# Patient Record
Sex: Female | Born: 1935
Health system: Southern US, Community
[De-identification: ages and names within clinical notes are randomized; demographics above are authoritative.]

## PROBLEM LIST (undated history)

## (undated) DIAGNOSIS — K219 Gastro-esophageal reflux disease without esophagitis: Secondary | ICD-10-CM

## (undated) DIAGNOSIS — E039 Hypothyroidism, unspecified: Secondary | ICD-10-CM

## (undated) DIAGNOSIS — M1990 Unspecified osteoarthritis, unspecified site: Secondary | ICD-10-CM

## (undated) DIAGNOSIS — D649 Anemia, unspecified: Secondary | ICD-10-CM

## (undated) DIAGNOSIS — R0602 Shortness of breath: Secondary | ICD-10-CM

## (undated) DIAGNOSIS — R739 Hyperglycemia, unspecified: Secondary | ICD-10-CM

## (undated) DIAGNOSIS — M109 Gout, unspecified: Secondary | ICD-10-CM

## (undated) DIAGNOSIS — H269 Unspecified cataract: Secondary | ICD-10-CM

## (undated) DIAGNOSIS — G459 Transient cerebral ischemic attack, unspecified: Secondary | ICD-10-CM

## (undated) DIAGNOSIS — R42 Dizziness and giddiness: Secondary | ICD-10-CM

## (undated) DIAGNOSIS — M1711 Unilateral primary osteoarthritis, right knee: Secondary | ICD-10-CM

## (undated) DIAGNOSIS — F419 Anxiety disorder, unspecified: Secondary | ICD-10-CM

## (undated) DIAGNOSIS — I471 Supraventricular tachycardia: Secondary | ICD-10-CM

## (undated) DIAGNOSIS — F329 Major depressive disorder, single episode, unspecified: Secondary | ICD-10-CM

## (undated) DIAGNOSIS — I2510 Atherosclerotic heart disease of native coronary artery without angina pectoris: Secondary | ICD-10-CM

## (undated) DIAGNOSIS — Z8719 Personal history of other diseases of the digestive system: Secondary | ICD-10-CM

## (undated) DIAGNOSIS — F32A Depression, unspecified: Secondary | ICD-10-CM

## (undated) DIAGNOSIS — K5732 Diverticulitis of large intestine without perforation or abscess without bleeding: Secondary | ICD-10-CM

## (undated) HISTORY — DX: Transient cerebral ischemic attack, unspecified: G45.9

## (undated) HISTORY — DX: Unspecified cataract: H26.9

## (undated) HISTORY — PX: HERNIA REPAIR: SHX51

## (undated) HISTORY — PX: PARTIAL COLECTOMY: SHX5273

## (undated) HISTORY — DX: Atherosclerotic heart disease of native coronary artery without angina pectoris: I25.10

## (undated) HISTORY — DX: Supraventricular tachycardia: I47.1

## (undated) HISTORY — DX: Hypothyroidism, unspecified: E03.9

## (undated) HISTORY — DX: Gout, unspecified: M10.9

## (undated) HISTORY — DX: Unilateral primary osteoarthritis, right knee: M17.11

## (undated) HISTORY — PX: COLON SURGERY: SHX602

## (undated) HISTORY — DX: Gastro-esophageal reflux disease without esophagitis: K21.9

## (undated) HISTORY — DX: Anemia, unspecified: D64.9

## (undated) HISTORY — DX: Unspecified osteoarthritis, unspecified site: M19.90

## (undated) HISTORY — PX: ABDOMINAL HYSTERECTOMY: SHX81

## (undated) HISTORY — DX: Depression, unspecified: F32.A

## (undated) HISTORY — DX: Diverticulitis of large intestine without perforation or abscess without bleeding: K57.32

## (undated) HISTORY — DX: Hyperglycemia, unspecified: R73.9

## (undated) HISTORY — DX: Major depressive disorder, single episode, unspecified: F32.9

---

## 1997-11-19 ENCOUNTER — Emergency Department (HOSPITAL_COMMUNITY): Admission: EM | Admit: 1997-11-19 | Discharge: 1997-11-19 | Payer: Self-pay | Admitting: Emergency Medicine

## 2001-02-16 ENCOUNTER — Encounter: Payer: Self-pay | Admitting: Emergency Medicine

## 2001-02-16 ENCOUNTER — Inpatient Hospital Stay (HOSPITAL_COMMUNITY): Admission: EM | Admit: 2001-02-16 | Discharge: 2001-02-19 | Payer: Self-pay | Admitting: Emergency Medicine

## 2001-02-19 ENCOUNTER — Encounter: Payer: Self-pay | Admitting: Cardiology

## 2001-03-09 ENCOUNTER — Encounter: Payer: Self-pay | Admitting: Cardiology

## 2001-03-09 ENCOUNTER — Encounter: Admission: RE | Admit: 2001-03-09 | Discharge: 2001-03-09 | Payer: Self-pay | Admitting: Cardiology

## 2001-04-09 ENCOUNTER — Encounter: Payer: Self-pay | Admitting: Neurological Surgery

## 2001-04-09 ENCOUNTER — Encounter: Admission: RE | Admit: 2001-04-09 | Discharge: 2001-04-09 | Payer: Self-pay | Admitting: Neurological Surgery

## 2001-06-10 ENCOUNTER — Ambulatory Visit (HOSPITAL_COMMUNITY): Admission: RE | Admit: 2001-06-10 | Discharge: 2001-06-10 | Payer: Self-pay | Admitting: Neurological Surgery

## 2001-06-10 ENCOUNTER — Encounter: Payer: Self-pay | Admitting: Neurological Surgery

## 2002-09-23 ENCOUNTER — Ambulatory Visit (HOSPITAL_COMMUNITY): Admission: RE | Admit: 2002-09-23 | Discharge: 2002-09-23 | Payer: Self-pay | Admitting: Family Medicine

## 2002-09-23 ENCOUNTER — Encounter: Payer: Self-pay | Admitting: Family Medicine

## 2003-04-01 ENCOUNTER — Emergency Department (HOSPITAL_COMMUNITY): Admission: EM | Admit: 2003-04-01 | Discharge: 2003-04-01 | Payer: Self-pay | Admitting: Emergency Medicine

## 2003-11-02 ENCOUNTER — Encounter: Admission: RE | Admit: 2003-11-02 | Discharge: 2003-11-02 | Payer: Self-pay | Admitting: Orthopedic Surgery

## 2003-12-08 ENCOUNTER — Emergency Department (HOSPITAL_COMMUNITY): Admission: EM | Admit: 2003-12-08 | Discharge: 2003-12-08 | Payer: Self-pay | Admitting: Emergency Medicine

## 2005-04-28 ENCOUNTER — Ambulatory Visit: Payer: Self-pay | Admitting: Family Medicine

## 2005-06-25 ENCOUNTER — Ambulatory Visit: Payer: Self-pay | Admitting: Family Medicine

## 2005-07-18 ENCOUNTER — Encounter: Admission: RE | Admit: 2005-07-18 | Discharge: 2005-07-18 | Payer: Self-pay | Admitting: Orthopedic Surgery

## 2005-08-01 ENCOUNTER — Ambulatory Visit: Payer: Self-pay | Admitting: Family Medicine

## 2005-08-08 ENCOUNTER — Ambulatory Visit (HOSPITAL_COMMUNITY): Admission: RE | Admit: 2005-08-08 | Discharge: 2005-08-08 | Payer: Self-pay | Admitting: Family Medicine

## 2005-09-22 ENCOUNTER — Ambulatory Visit: Payer: Self-pay | Admitting: Internal Medicine

## 2005-09-29 ENCOUNTER — Ambulatory Visit: Payer: Self-pay | Admitting: Cardiology

## 2005-10-06 ENCOUNTER — Ambulatory Visit: Payer: Self-pay | Admitting: Cardiology

## 2005-10-06 ENCOUNTER — Ambulatory Visit: Payer: Self-pay

## 2005-10-10 ENCOUNTER — Ambulatory Visit: Payer: Self-pay | Admitting: Internal Medicine

## 2005-10-10 HISTORY — PX: COLONOSCOPY: SHX174

## 2005-10-24 ENCOUNTER — Ambulatory Visit: Payer: Self-pay | Admitting: Family Medicine

## 2005-10-24 ENCOUNTER — Encounter: Admission: RE | Admit: 2005-10-24 | Discharge: 2005-10-24 | Payer: Self-pay | Admitting: Orthopedic Surgery

## 2005-10-27 ENCOUNTER — Ambulatory Visit: Payer: Self-pay | Admitting: Cardiology

## 2005-11-12 ENCOUNTER — Encounter: Admission: RE | Admit: 2005-11-12 | Discharge: 2005-11-12 | Payer: Self-pay | Admitting: Orthopedic Surgery

## 2005-11-14 ENCOUNTER — Ambulatory Visit: Payer: Self-pay | Admitting: Family Medicine

## 2005-12-08 ENCOUNTER — Encounter: Admission: RE | Admit: 2005-12-08 | Discharge: 2006-03-08 | Payer: Self-pay | Admitting: Family Medicine

## 2005-12-08 ENCOUNTER — Ambulatory Visit: Payer: Self-pay | Admitting: Family Medicine

## 2006-05-22 ENCOUNTER — Ambulatory Visit: Payer: Self-pay | Admitting: Family Medicine

## 2006-07-01 ENCOUNTER — Ambulatory Visit: Payer: Self-pay | Admitting: Family Medicine

## 2006-07-13 ENCOUNTER — Ambulatory Visit: Payer: Self-pay | Admitting: Family Medicine

## 2006-08-06 ENCOUNTER — Ambulatory Visit: Payer: Self-pay | Admitting: Family Medicine

## 2006-08-06 ENCOUNTER — Encounter: Admission: RE | Admit: 2006-08-06 | Discharge: 2006-08-06 | Payer: Self-pay | Admitting: Family Medicine

## 2006-08-06 LAB — CONVERTED CEMR LAB
ALT: 16 units/L (ref 0–40)
AST: 18 units/L (ref 0–37)
Alkaline Phosphatase: 84 units/L (ref 39–117)
Basophils Absolute: 0 10*3/uL (ref 0.0–0.1)
Eosinophils Absolute: 0.1 10*3/uL (ref 0.0–0.6)
Glucose, Bld: 79 mg/dL (ref 70–99)
HCT: 42.8 % (ref 36.0–46.0)
Lymphocytes Relative: 30 % (ref 12.0–46.0)
MCHC: 33.4 g/dL (ref 30.0–36.0)
MCV: 86.7 fL (ref 78.0–100.0)
Monocytes Absolute: 0.8 10*3/uL — ABNORMAL HIGH (ref 0.2–0.7)
Neutro Abs: 5.6 10*3/uL (ref 1.4–7.7)
Neutrophils Relative %: 59.7 % (ref 43.0–77.0)
RBC: 4.93 M/uL (ref 3.87–5.11)
Sodium: 143 meq/L (ref 135–145)
Total Bilirubin: 0.8 mg/dL (ref 0.3–1.2)
Total Protein: 6.9 g/dL (ref 6.0–8.3)

## 2006-12-18 ENCOUNTER — Telehealth: Payer: Self-pay | Admitting: *Deleted

## 2007-04-27 ENCOUNTER — Ambulatory Visit: Payer: Self-pay | Admitting: Family Medicine

## 2007-04-27 DIAGNOSIS — R5383 Other fatigue: Secondary | ICD-10-CM

## 2007-04-27 DIAGNOSIS — R5381 Other malaise: Secondary | ICD-10-CM

## 2007-04-27 DIAGNOSIS — K219 Gastro-esophageal reflux disease without esophagitis: Secondary | ICD-10-CM | POA: Insufficient documentation

## 2007-04-27 DIAGNOSIS — I2510 Atherosclerotic heart disease of native coronary artery without angina pectoris: Secondary | ICD-10-CM

## 2007-04-27 DIAGNOSIS — D649 Anemia, unspecified: Secondary | ICD-10-CM

## 2007-04-27 DIAGNOSIS — M545 Low back pain: Secondary | ICD-10-CM

## 2007-04-27 DIAGNOSIS — E785 Hyperlipidemia, unspecified: Secondary | ICD-10-CM

## 2007-04-27 DIAGNOSIS — E039 Hypothyroidism, unspecified: Secondary | ICD-10-CM | POA: Insufficient documentation

## 2007-04-27 DIAGNOSIS — F329 Major depressive disorder, single episode, unspecified: Secondary | ICD-10-CM

## 2007-04-29 LAB — CONVERTED CEMR LAB
AST: 23 units/L (ref 0–37)
Albumin: 4 g/dL (ref 3.5–5.2)
Basophils Absolute: 0.1 10*3/uL (ref 0.0–0.1)
Chloride: 104 meq/L (ref 96–112)
Eosinophils Absolute: 0.3 10*3/uL (ref 0.0–0.6)
GFR calc non Af Amer: 52 mL/min
HCT: 42.8 % (ref 36.0–46.0)
MCHC: 33.8 g/dL (ref 30.0–36.0)
MCV: 87.6 fL (ref 78.0–100.0)
Monocytes Relative: 6.9 % (ref 3.0–11.0)
Neutrophils Relative %: 49 % (ref 43.0–77.0)
Platelets: 337 10*3/uL (ref 150–400)
RBC: 4.89 M/uL (ref 3.87–5.11)
Sodium: 141 meq/L (ref 135–145)
TSH: 6.44 microintl units/mL — ABNORMAL HIGH (ref 0.35–5.50)
Vitamin B-12: 245 pg/mL (ref 211–911)

## 2007-05-12 ENCOUNTER — Telehealth: Payer: Self-pay | Admitting: Family Medicine

## 2007-07-15 ENCOUNTER — Telehealth: Payer: Self-pay | Admitting: Family Medicine

## 2007-07-20 ENCOUNTER — Telehealth: Payer: Self-pay | Admitting: Family Medicine

## 2007-08-23 ENCOUNTER — Ambulatory Visit: Payer: Self-pay | Admitting: Family Medicine

## 2007-08-24 ENCOUNTER — Ambulatory Visit: Payer: Self-pay | Admitting: Family Medicine

## 2007-08-25 ENCOUNTER — Telehealth: Payer: Self-pay | Admitting: Family Medicine

## 2007-08-30 ENCOUNTER — Ambulatory Visit: Payer: Self-pay | Admitting: Internal Medicine

## 2007-11-25 ENCOUNTER — Ambulatory Visit: Payer: Self-pay | Admitting: Family Medicine

## 2007-11-25 DIAGNOSIS — M79609 Pain in unspecified limb: Secondary | ICD-10-CM | POA: Insufficient documentation

## 2007-11-25 DIAGNOSIS — M1990 Unspecified osteoarthritis, unspecified site: Secondary | ICD-10-CM | POA: Insufficient documentation

## 2007-12-20 ENCOUNTER — Ambulatory Visit: Payer: Self-pay | Admitting: Vascular Surgery

## 2008-01-07 ENCOUNTER — Ambulatory Visit: Payer: Self-pay | Admitting: Family Medicine

## 2008-01-07 DIAGNOSIS — J209 Acute bronchitis, unspecified: Secondary | ICD-10-CM | POA: Insufficient documentation

## 2008-01-24 ENCOUNTER — Ambulatory Visit: Payer: Self-pay | Admitting: Cardiology

## 2008-02-07 ENCOUNTER — Ambulatory Visit: Payer: Self-pay

## 2008-02-07 ENCOUNTER — Encounter: Payer: Self-pay | Admitting: Cardiology

## 2008-02-07 ENCOUNTER — Encounter: Payer: Self-pay | Admitting: Family Medicine

## 2008-02-11 ENCOUNTER — Ambulatory Visit: Payer: Self-pay | Admitting: Cardiology

## 2008-02-11 LAB — CONVERTED CEMR LAB
ALT: 12 units/L (ref 0–35)
Albumin: 3.9 g/dL (ref 3.5–5.2)
Direct LDL: 144.1 mg/dL
HDL: 52.1 mg/dL (ref >39.0)
Total Bilirubin: 0.7 mg/dL (ref 0.3–1.2)
Total Protein: 7.1 g/dL (ref 6.0–8.3)
Triglycerides: 99 mg/dL (ref 0–149)

## 2008-04-05 ENCOUNTER — Ambulatory Visit: Payer: Self-pay | Admitting: Family Medicine

## 2008-04-05 LAB — CONVERTED CEMR LAB: Rapid Strep: NEGATIVE

## 2008-06-21 ENCOUNTER — Ambulatory Visit: Payer: Self-pay | Admitting: Family Medicine

## 2008-06-21 DIAGNOSIS — R7309 Other abnormal glucose: Secondary | ICD-10-CM | POA: Insufficient documentation

## 2008-06-22 ENCOUNTER — Encounter: Payer: Self-pay | Admitting: Family Medicine

## 2008-06-22 ENCOUNTER — Ambulatory Visit: Payer: Self-pay | Admitting: Internal Medicine

## 2008-06-23 LAB — CONVERTED CEMR LAB
ALT: 9 units/L (ref 0–35)
Albumin: 4 g/dL (ref 3.5–5.2)
BUN: 17 mg/dL (ref 6–23)
Basophils Absolute: 0 10*3/uL (ref 0.0–0.1)
Basophils Relative: 0.5 % (ref 0.0–3.0)
CO2: 29 meq/L (ref 19–32)
Calcium: 9.4 mg/dL (ref 8.4–10.5)
Creatinine, Ser: 0.9 mg/dL (ref 0.4–1.2)
Direct LDL: 144 mg/dL
Eosinophils Absolute: 0.2 10*3/uL (ref 0.0–0.7)
GFR calc non Af Amer: 65 mL/min
HCT: 46.1 % — ABNORMAL HIGH (ref 36.0–46.0)
Hemoglobin: 15.4 g/dL — ABNORMAL HIGH (ref 12.0–15.0)
Hgb A1c MFr Bld: 5.9 % (ref 4.6–6.0)
MCHC: 33.5 g/dL (ref 30.0–36.0)
MCV: 87.5 fL (ref 78.0–100.0)
Neutro Abs: 4.1 10*3/uL (ref 1.4–7.7)
RBC: 5.27 M/uL — ABNORMAL HIGH (ref 3.87–5.11)
Total Bilirubin: 0.5 mg/dL (ref 0.3–1.2)

## 2008-08-15 ENCOUNTER — Ambulatory Visit: Payer: Self-pay | Admitting: Family Medicine

## 2008-08-15 DIAGNOSIS — R071 Chest pain on breathing: Secondary | ICD-10-CM

## 2008-08-15 DIAGNOSIS — J309 Allergic rhinitis, unspecified: Secondary | ICD-10-CM | POA: Insufficient documentation

## 2008-12-05 ENCOUNTER — Ambulatory Visit: Payer: Self-pay | Admitting: Family Medicine

## 2008-12-05 DIAGNOSIS — N949 Unspecified condition associated with female genital organs and menstrual cycle: Secondary | ICD-10-CM | POA: Insufficient documentation

## 2009-01-03 ENCOUNTER — Encounter: Payer: Self-pay | Admitting: Family Medicine

## 2009-07-13 ENCOUNTER — Ambulatory Visit: Payer: Self-pay | Admitting: Family Medicine

## 2009-08-21 ENCOUNTER — Ambulatory Visit: Payer: Self-pay | Admitting: Family Medicine

## 2009-08-21 DIAGNOSIS — K5732 Diverticulitis of large intestine without perforation or abscess without bleeding: Secondary | ICD-10-CM

## 2009-08-23 ENCOUNTER — Other Ambulatory Visit: Admission: RE | Admit: 2009-08-23 | Discharge: 2009-08-23 | Payer: Self-pay | Admitting: Family Medicine

## 2009-08-23 ENCOUNTER — Ambulatory Visit: Payer: Self-pay | Admitting: Family Medicine

## 2009-08-23 DIAGNOSIS — M21619 Bunion of unspecified foot: Secondary | ICD-10-CM

## 2009-08-28 ENCOUNTER — Telehealth: Payer: Self-pay

## 2009-09-19 ENCOUNTER — Ambulatory Visit: Payer: Self-pay | Admitting: Family Medicine

## 2009-10-01 ENCOUNTER — Ambulatory Visit: Payer: Self-pay | Admitting: Family Medicine

## 2009-10-04 ENCOUNTER — Ambulatory Visit: Payer: Self-pay | Admitting: Internal Medicine

## 2009-11-08 ENCOUNTER — Telehealth: Payer: Self-pay | Admitting: Family Medicine

## 2010-01-07 ENCOUNTER — Ambulatory Visit: Payer: Self-pay | Admitting: Family Medicine

## 2010-01-09 ENCOUNTER — Telehealth: Payer: Self-pay | Admitting: Family Medicine

## 2010-02-08 ENCOUNTER — Ambulatory Visit: Payer: Self-pay | Admitting: Family Medicine

## 2010-02-08 DIAGNOSIS — H109 Unspecified conjunctivitis: Secondary | ICD-10-CM | POA: Insufficient documentation

## 2010-03-13 ENCOUNTER — Telehealth: Payer: Self-pay | Admitting: Family Medicine

## 2010-04-23 ENCOUNTER — Emergency Department (HOSPITAL_COMMUNITY)
Admission: EM | Admit: 2010-04-23 | Discharge: 2010-04-23 | Payer: Self-pay | Source: Home / Self Care | Admitting: Family Medicine

## 2010-04-23 ENCOUNTER — Telehealth: Payer: Self-pay | Admitting: Family Medicine

## 2010-05-01 ENCOUNTER — Ambulatory Visit: Payer: Self-pay

## 2010-05-01 DIAGNOSIS — M25579 Pain in unspecified ankle and joints of unspecified foot: Secondary | ICD-10-CM

## 2010-05-02 ENCOUNTER — Encounter: Payer: Self-pay | Admitting: Family Medicine

## 2010-05-09 ENCOUNTER — Telehealth: Payer: Self-pay | Admitting: Family Medicine

## 2010-06-06 ENCOUNTER — Telehealth: Payer: Self-pay | Admitting: Family Medicine

## 2010-06-15 ENCOUNTER — Emergency Department (HOSPITAL_COMMUNITY)
Admission: EM | Admit: 2010-06-15 | Discharge: 2010-06-15 | Payer: Self-pay | Source: Home / Self Care | Admitting: Family Medicine

## 2010-06-16 ENCOUNTER — Encounter: Payer: Self-pay | Admitting: Family Medicine

## 2010-06-17 ENCOUNTER — Ambulatory Visit
Admission: RE | Admit: 2010-06-17 | Discharge: 2010-06-17 | Payer: Self-pay | Source: Home / Self Care | Attending: Family Medicine | Admitting: Family Medicine

## 2010-06-17 ENCOUNTER — Other Ambulatory Visit: Payer: Self-pay | Admitting: Family Medicine

## 2010-06-17 DIAGNOSIS — K449 Diaphragmatic hernia without obstruction or gangrene: Secondary | ICD-10-CM | POA: Insufficient documentation

## 2010-06-17 LAB — HEPATIC FUNCTION PANEL
Bilirubin, Direct: 0.1 mg/dL (ref 0.0–0.3)
Total Bilirubin: 0.3 mg/dL (ref 0.3–1.2)

## 2010-06-17 LAB — CBC WITH DIFFERENTIAL/PLATELET
Eosinophils Absolute: 0.2 10*3/uL (ref 0.0–0.7)
MCHC: 33.4 g/dL (ref 30.0–36.0)
MCV: 88.6 fl (ref 78.0–100.0)
Monocytes Absolute: 0.7 10*3/uL (ref 0.1–1.0)
Neutrophils Relative %: 53 % (ref 43.0–77.0)
Platelets: 345 10*3/uL (ref 150.0–400.0)
RDW: 14.8 % — ABNORMAL HIGH (ref 11.5–14.6)

## 2010-06-17 LAB — BASIC METABOLIC PANEL
BUN: 20 mg/dL (ref 6–23)
CO2: 28 mEq/L (ref 19–32)
Chloride: 103 mEq/L (ref 96–112)
Creatinine, Ser: 1 mg/dL (ref 0.4–1.2)

## 2010-06-18 LAB — DIFFERENTIAL
Basophils Absolute: 0.1 10*3/uL (ref 0.0–0.1)
Lymphocytes Relative: 20 % (ref 12–46)
Monocytes Absolute: 0.9 10*3/uL (ref 0.1–1.0)
Monocytes Relative: 7 % (ref 3–12)
Neutro Abs: 8.9 10*3/uL — ABNORMAL HIGH (ref 1.7–7.7)
Neutrophils Relative %: 72 % (ref 43–77)

## 2010-06-18 LAB — POCT URINALYSIS DIPSTICK
Bilirubin Urine: NEGATIVE
Hgb urine dipstick: NEGATIVE
Nitrite: NEGATIVE
Specific Gravity, Urine: 1.015 (ref 1.005–1.030)
Urine Glucose, Fasting: NEGATIVE mg/dL

## 2010-06-18 LAB — CBC
HCT: 43.3 % (ref 36.0–46.0)
Hemoglobin: 13.7 g/dL (ref 12.0–15.0)
MCHC: 31.6 g/dL (ref 30.0–36.0)
RBC: 4.93 MIL/uL (ref 3.87–5.11)

## 2010-06-23 LAB — CONVERTED CEMR LAB
Albumin: 3.8 g/dL (ref 3.5–5.2)
Basophils Absolute: 0.1 10*3/uL (ref 0.0–0.1)
Basophils Relative: 0.7 % (ref 0.0–3.0)
Bilirubin Urine: NEGATIVE
CO2: 30 meq/L (ref 19–32)
Calcium: 9.6 mg/dL (ref 8.4–10.5)
Chloride: 104 meq/L (ref 96–112)
Direct LDL: 150.6 mg/dL
Eosinophils Absolute: 0.1 10*3/uL (ref 0.0–0.7)
Glucose, Bld: 103 mg/dL — ABNORMAL HIGH (ref 70–99)
HCT: 46.8 % — ABNORMAL HIGH (ref 36.0–46.0)
Hemoglobin: 15.8 g/dL — ABNORMAL HIGH (ref 12.0–15.0)
Lymphs Abs: 2.7 10*3/uL (ref 0.7–4.0)
MCHC: 33.7 g/dL (ref 30.0–36.0)
MCV: 87.3 fL (ref 78.0–100.0)
Monocytes Absolute: 0.6 10*3/uL (ref 0.1–1.0)
Neutro Abs: 5.3 10*3/uL (ref 1.4–7.7)
Pap Smear: NEGATIVE
Potassium: 4.2 meq/L (ref 3.5–5.1)
RBC: 5.37 M/uL — ABNORMAL HIGH (ref 3.87–5.11)
RDW: 15.2 % — ABNORMAL HIGH (ref 11.5–14.6)
Sodium: 143 meq/L (ref 135–145)
TSH: 0.6 microintl units/mL (ref 0.35–5.50)
Total CHOL/HDL Ratio: 4
Total Protein: 7.7 g/dL (ref 6.0–8.3)
Triglycerides: 113 mg/dL (ref 0.0–149.0)
Urobilinogen, UA: 0.2
WBC Urine, dipstick: NEGATIVE

## 2010-06-27 NOTE — Assessment & Plan Note (Signed)
Summary: chest congestion and pain when breathing?/dm   Vital Signs:  Patient profile:   77 year old female Weight:      155 pounds O2 Sat:      94 % on Room air Temp:     98.0 degrees F oral Pulse rate:   91 / minute BP sitting:   132 / 84  (left arm) Cuff size:   regular  Vitals Entered By: Alfred Levins, CMA (July 13, 2009 1:54 PM)  O2 Flow:  Room air CC: st, chest pain, SOB, not taking her meds, chest hurts when breathing in x3 wks   History of Present Illness: Here for 3 weeks of constant sharp anterior chest pains. No SOB or cough, although taking deep breaths is painful. No fever or nausea.   Current Medications (verified): 1)  Ibuprofen 800 Mg Tabs (Ibuprofen) .Marland Kitchen.. 1 By Mouth Four Times Daily As Needed For Pain 2)  Nitroglycerin 0.3 Mg Subl (Nitroglycerin) .... As Needed  Allergies (verified): 1)  ! Sulfa  Past History:  Past Medical History: Reviewed history from 04/05/2008 and no changes required. Coronary artery disease, sees Dr. Antoine Poche Anemia-NOS Depression GERD Hyperlipidemia Hypothyroidism cardiac stress test 02-07-08, showed slight ischemia  Osteoarthritis  Past Surgical History: Reviewed history from 11/25/2007 and no changes required. Cardiac Cath for angina TAH and BSO 1965 colonoscopy 10-10-05 per Dr. Juanda Chance, repeat in 10 yrs  Review of Systems  The patient denies anorexia, fever, weight loss, weight gain, vision loss, decreased hearing, hoarseness, syncope, dyspnea on exertion, peripheral edema, prolonged cough, headaches, hemoptysis, abdominal pain, melena, hematochezia, severe indigestion/heartburn, hematuria, incontinence, genital sores, muscle weakness, suspicious skin lesions, transient blindness, difficulty walking, depression, unusual weight change, abnormal bleeding, enlarged lymph nodes, angioedema, breast masses, and testicular masses.    Physical Exam  General:  Well-developed,well-nourished,in no acute distress;  alert,appropriate and cooperative throughout examination Head:  Normocephalic and atraumatic without obvious abnormalities. No apparent alopecia or balding. Eyes:  No corneal or conjunctival inflammation noted. EOMI. Perrla. Funduscopic exam benign, without hemorrhages, exudates or papilledema. Vision grossly normal. Ears:  External ear exam shows no significant lesions or deformities.  Otoscopic examination reveals clear canals, tympanic membranes are intact bilaterally without bulging, retraction, inflammation or discharge. Hearing is grossly normal bilaterally. Nose:  External nasal examination shows no deformity or inflammation. Nasal mucosa are pink and moist without lesions or exudates. Mouth:  Oral mucosa and oropharynx without lesions or exudates.  Teeth in good repair. Neck:  No deformities, masses, or tenderness noted. Chest Wall:  very tender along both sternal margins Lungs:  Normal respiratory effort, chest expands symmetrically. Lungs are clear to auscultation, no crackles or wheezes. Heart:  Normal rate and regular rhythm. S1 and S2 normal without gallop, murmur, click, rub or other extra sounds. EKG normal   Impression & Recommendations:  Problem # 1:  CHEST WALL PAIN, ANTERIOR (ICD-786.52)  Her updated medication list for this problem includes:    Ibuprofen 800 Mg Tabs (Ibuprofen) .Marland Kitchen... 1 by mouth four times daily as needed for pain    Nitroglycerin 0.3 Mg Subl (Nitroglycerin) .Marland Kitchen... As needed  Orders: EKG w/ Interpretation (93000) Depo- Medrol 80mg  (J1040) Admin of Therapeutic Inj  intramuscular or subcutaneous (16109)  Complete Medication List: 1)  Ibuprofen 800 Mg Tabs (Ibuprofen) .Marland Kitchen.. 1 by mouth four times daily as needed for pain 2)  Nitroglycerin 0.3 Mg Subl (Nitroglycerin) .... As needed 3)  Prednisone (pak) 10 Mg Tabs (Prednisone) .... As directed for 12 days 4)  Synthroid  100 Mcg Tabs (Levothyroxine sodium) .... Once daily  Patient Instructions: 1)  This is  costochondritis. use steroids to calm it down.  2)  Please schedule a follow-up appointment as needed .  Prescriptions: SYNTHROID 100 MCG TABS (LEVOTHYROXINE SODIUM) once daily  #30 x 11   Entered and Authorized by:   Nelwyn Salisbury MD   Signed by:   Nelwyn Salisbury MD on 07/13/2009   Method used:   Electronically to        [facility]  [phone]* (retail)       [address]       [place], [place]  [address]       Ph: [phone] or [phone]       Fax: [phone]   RxID:   [id number] PREDNISONE (PAK) 10 MG TABS (PREDNISONE) as directed for 12 days  #1 x 0   Entered and Authorized by:   Nelwyn Salisbury MD   Signed by:   Nelwyn Salisbury MD on 07/13/2009   Method used:   Electronically to        [facility]  [phone]* (retail)       [address]       [place], [place]  [address]       Ph: [phone] or [phone]       Fax: [phone]   RxID:   [id number]    Medication Administration  Injection # 1:    Medication: Depo- Medrol 80mg     Diagnosis: CHEST WALL PAIN, ANTERIOR (ICD-786.52)    Route: IM    Site: RUOQ gluteus    Exp Date: 01/2012    Lot #: [id number]    Mfr: Pharmacia    Comments: 120mg     Patient tolerated injection without complications    Given by: Alfred Levins, CMA (July 13, 2009 4:42 PM)  Orders Added: 1)  Est. Patient Level IV [31517] 2)  EKG w/ Interpretation [93000] 3)  Depo- Medrol 80mg  [J1040] 4)  Admin of Therapeutic Inj  intramuscular or subcutaneous [61607]

## 2010-06-27 NOTE — Assessment & Plan Note (Signed)
Summary: EYE ISSUES//CCM   Vital Signs:  Patient profile:   77 year old female Weight:      153 pounds O2 Sat:      92 % Temp:     98.2 degrees F Pulse rate:   90 / minute BP sitting:   124 / 82  (left arm) Cuff size:   regular  Vitals Entered By: Pura Spice, RN (February 08, 2010 10:49 AM) CC: c/o eyes matterd together draining and itching.    History of Present Illness: Here for 2 days of redness, burning, and tearing of both eyes. No DC. No fever or URI symptoms.   Allergies: 1)  ! Sulfa  Past History:  Past Medical History: Reviewed history from 08/23/2009 and no changes required. Coronary artery disease, sees Dr. Antoine Poche Anemia-NOS Depression GERD Hyperlipidemia Hypothyroidism cardiac stress test 02-07-08, showed slight ischemia  Osteoarthritis chronic right knee pain, sees Dr. Thurston Hole  Review of Systems  The patient denies anorexia, fever, weight loss, weight gain, vision loss, decreased hearing, hoarseness, chest pain, syncope, dyspnea on exertion, peripheral edema, prolonged cough, headaches, hemoptysis, abdominal pain, melena, hematochezia, severe indigestion/heartburn, hematuria, incontinence, genital sores, muscle weakness, suspicious skin lesions, transient blindness, difficulty walking, depression, unusual weight change, abnormal bleeding, enlarged lymph nodes, angioedema, breast masses, and testicular masses.         Flu Vaccine Consent Questions     Do you have a history of severe allergic reactions to this vaccine? no    Any prior history of allergic reactions to egg and/or gelatin? no    Do you have a sensitivity to the preservative Thimersol? no    Do you have a past history of Guillan-Barre Syndrome? no    Do you currently have an acute febrile illness? no    Have you ever had a severe reaction to latex? no    Vaccine information given and explained to patient? yes    Are you currently pregnant? no    Lot Number:[id number]   Exp  Date:11/23/2010   Site Given  Left Deltoid IM Pura Spice, RN  February 08, 2010 10:53 AM   Physical Exam  General:  Well-developed,well-nourished,in no acute distress; alert,appropriate and cooperative throughout examination Head:  Normocephalic and atraumatic without obvious abnormalities. No apparent alopecia or balding. Eyes:  both conjunctivae are red, corneas are clear Ears:  External ear exam shows no significant lesions or deformities.  Otoscopic examination reveals clear canals, tympanic membranes are intact bilaterally without bulging, retraction, inflammation or discharge. Hearing is grossly normal bilaterally. Nose:  External nasal examination shows no deformity or inflammation. Nasal mucosa are pink and moist without lesions or exudates. Mouth:  Oral mucosa and oropharynx without lesions or exudates.  Teeth in good repair. Neck:  No deformities, masses, or tenderness noted. Lungs:  Normal respiratory effort, chest expands symmetrically. Lungs are clear to auscultation, no crackles or wheezes.   Impression & Recommendations:  Problem # 1:  CONJUNCTIVITIS (ICD-372.30)  Complete Medication List: 1)  Ibuprofen 800 Mg Tabs (Ibuprofen) .Marland Kitchen.. 1 by mouth four times daily as needed for pain 2)  Nitroglycerin 0.3 Mg Subl (Nitroglycerin) .... As needed for chest pain 3)  Lomotil 2.5-0.025 Mg/46ml Liqd (Diphenoxylate-atropine) .... As directed 4)  Synthroid 100 Mcg Tabs (Levothyroxine sodium) .... Once daily 5)  Ranitidine Hcl 150 Mg Tabs (Ranitidine hcl) .... Two times a day 6)  Vicodin Hp 10-660 Mg Tabs (Hydrocodone-acetaminophen) .Marland Kitchen.. 1 q 6 hours as needed pain 7)  Neomycin-polymyxin-dexameth 3.5-10000-0.1 Susp (Neomycin-polymyxin-dexameth) .Marland KitchenMarland KitchenMarland Kitchen  2 drops in eyes q 4 hours as needed  Other Orders: Flu Vaccine 67yrs + MEDICARE PATIENTS (Q4696) Administration Flu vaccine - MCR (E9528)  Patient Instructions: 1)  Please schedule a follow-up appointment as needed .   Prescriptions: NEOMYCIN-POLYMYXIN-DEXAMETH 3.5-10000-0.1 SUSP (NEOMYCIN-POLYMYXIN-DEXAMETH) 2 drops in eyes q 4 hours as needed  #10 x 0   Entered and Authorized by:   Nelwyn Salisbury MD   Signed by:   Nelwyn Salisbury MD on 02/08/2010   Method used:   Electronically to        [facility]  [phone]* (retail)       [address]       [place], [place]  [address]       Ph: [phone] or [phone]       Fax: [phone]   RxID:   [id number]

## 2010-06-27 NOTE — Progress Notes (Signed)
Summary: NOTE FOR WHEEL CHAIR ACCESSIBILITY  Phone Note Call from Patient   Caller: Patient   [phone] Summary of Call: Pt called to request a doctors note stating that she wheel chair / wheel chair accessibility during her flight and trip.... Pt adv that Dr Clent Ridges has issued this note in the past for her to take during her trip to visit her daughter.... Pt adv that she can be reached at [phone] when note is ready.  Initial call taken by: Debbra Riding,  November 08, 2009 9:22 AM  Follow-up for Phone Call        done, in your box Follow-up by: Nelwyn Salisbury MD,  November 08, 2009 10:20 AM  Additional Follow-up for Phone Call Additional follow up Details #1::        Phone Call Completed Additional Follow-up by: Raechel Ache, RN,  November 08, 2009 10:26 AM

## 2010-06-27 NOTE — Progress Notes (Signed)
Summary: Pt req cheaper alternative med for Augmentin and Hydromet  Phone Note Call from Patient Call back at [phone] cell   Caller: Patient Summary of Call: Pt called and said that the Augmentin (Amoxicillan) is too expensive. Pt also said that the Hydromet syrup is too expensive.  Pt gets meds at [facility] and they informed her that they have antibiotic available for $4.00 or 10.00 or 15.00. Pls call in a cheaper alternatives  meds to [facility] on [address]. Pt wants to be notified.  Initial call taken by: Lucy Antigua,  January 09, 2010 9:28 AM  Follow-up for Phone Call        Pt called and said [facility] on [address] is closed. Pls call in script to [facility] on [address] instead, asap today. Pt wants to be called as soon as this has been done.  Follow-up by: Lucy Antigua,  January 09, 2010 1:50 PM  Additional Follow-up for Phone Call Additional follow up Details #1::        there is nothing cheaper than generic Hydromet except for OTC Delsym. Instead of Augmentin, call in Amoxiciliin 875 two times a day for 10 days Additional Follow-up by: Nelwyn Salisbury MD,  January 09, 2010 2:45 PM    Additional Follow-up for Phone Call Additional follow up Details #2::    sent in Follow-up by: Raechel Ache, RN,  January 09, 2010 2:49 PM  New/Updated Medications: AMOXICILLIN 875 MG TABS (AMOXICILLIN) 1 two times a day Prescriptions: AMOXICILLIN 875 MG TABS (AMOXICILLIN) 1 two times a day  #20 x 0   Entered by:   Raechel Ache, RN   Authorized by:   Nelwyn Salisbury MD   Signed by:   Raechel Ache, RN on 01/09/2010   Method used:   Electronically to        [facility]  [phone]* (retail)       [address]       [place], [place]  [address]       Ph: [phone] or [phone]       Fax: [phone]   RxID:   [id number]

## 2010-06-27 NOTE — Progress Notes (Signed)
Summary: REFILL REQUEST  Phone Note Refill Request Message from:  Patient on May 09, 2010 12:32 PM  Refills Requested: Medication #1:  VICODIN HP 10-660 MG TABS 1 q 6 hours as needed pain   Notes: [facility].    Initial call taken by: Debbra Riding,  May 09, 2010 12:33 PM  Follow-up for Phone Call        call in #120 with 5 rf  Follow-up by: Nelwyn Salisbury MD,  May 10, 2010 1:57 PM    Prescriptions: VICODIN HP 10-660 MG TABS (HYDROCODONE-ACETAMINOPHEN) 1 q 6 hours as needed pain  #120 x 5   Entered by:   Sid Falcon LPN   Authorized by:   Nelwyn Salisbury MD   Signed by:   Sid Falcon LPN on 78/29/5621   Method used:   Telephoned to ...       [facility] [phone]* (retail)       [address] [place], [place]  [address]       Ph: [phone]       Fax: [phone]   RxID:   [id number]

## 2010-06-27 NOTE — Assessment & Plan Note (Signed)
Summary: sore throat/chest congestion/cjr/pt rsc/cjr   Vital Signs:  Patient profile:   77 year old female Weight:      148 pounds BMI:     24.72 O2 Sat:      95 % on Room air Temp:     98.2 degrees F oral BP sitting:   120 / 80  (left arm) Cuff size:   regular  Vitals Entered By: Raechel Ache, RN (January 07, 2010 3:45 PM)  O2 Flow:  Room air CC: C/o sore throat, bumps in mouth, fever 101 last night and cough.   History of Present Illness: Here for 3 days of fever to 101 degrees, ST, chest congestion, and a dry cough.   Allergies: 1)  ! Sulfa  Past History:  Past Medical History: Reviewed history from 08/23/2009 and no changes required. Coronary artery disease, sees Dr. Antoine Poche Anemia-NOS Depression GERD Hyperlipidemia Hypothyroidism cardiac stress test 02-07-08, showed slight ischemia  Osteoarthritis chronic right knee pain, sees Dr. Thurston Hole  Review of Systems  The patient denies anorexia, weight loss, weight gain, vision loss, decreased hearing, hoarseness, chest pain, syncope, dyspnea on exertion, peripheral edema, headaches, hemoptysis, abdominal pain, melena, hematochezia, severe indigestion/heartburn, hematuria, incontinence, genital sores, muscle weakness, suspicious skin lesions, transient blindness, difficulty walking, depression, unusual weight change, abnormal bleeding, enlarged lymph nodes, angioedema, breast masses, and testicular masses.    Physical Exam  General:  Well-developed,well-nourished,in no acute distress; alert,appropriate and cooperative throughout examination Head:  Normocephalic and atraumatic without obvious abnormalities. No apparent alopecia or balding. Eyes:  No corneal or conjunctival inflammation noted. EOMI. Perrla. Funduscopic exam benign, without hemorrhages, exudates or papilledema. Vision grossly normal. Ears:  External ear exam shows no significant lesions or deformities.  Otoscopic examination reveals clear canals, tympanic  membranes are intact bilaterally without bulging, retraction, inflammation or discharge. Hearing is grossly normal bilaterally. Nose:  External nasal examination shows no deformity or inflammation. Nasal mucosa are pink and moist without lesions or exudates. Mouth:  Oral mucosa and oropharynx without lesions or exudates.  Teeth in good repair. Neck:  No deformities, masses, or tenderness noted. Lungs:  Normal respiratory effort, chest expands symmetrically. Lungs are clear to auscultation, no crackles or wheezes.   Impression & Recommendations:  Problem # 1:  ACUTE BRONCHITIS (ICD-466.0)  Her updated medication list for this problem includes:    Augmentin 875-125 Mg Tabs (Amoxicillin-pot clavulanate) .Marland Kitchen..Marland Kitchen Two times a day    Hydromet 5-1.5 Mg/47ml Syrp (Hydrocodone-homatropine) .Marland Kitchen... 1 tsp q 4 hours as needed cough  Orders: Prescription Created Electronically [phone]) Rocephin  250mg  (X9147) Admin of Therapeutic Inj  intramuscular or subcutaneous (82956)  Complete Medication List: 1)  Ibuprofen 800 Mg Tabs (Ibuprofen) .Marland Kitchen.. 1 by mouth four times daily as needed for pain 2)  Nitroglycerin 0.3 Mg Subl (Nitroglycerin) .... As needed for chest pain 3)  Lomotil 2.5-0.025 Mg/44ml Liqd (Diphenoxylate-atropine) .... As directed 4)  Synthroid 100 Mcg Tabs (Levothyroxine sodium) .... Once daily 5)  Ranitidine Hcl 150 Mg Tabs (Ranitidine hcl) .... Two times a day 6)  Vicodin Hp 10-660 Mg Tabs (Hydrocodone-acetaminophen) .Marland Kitchen.. 1 q 6 hours as needed pain 7)  Augmentin 875-125 Mg Tabs (Amoxicillin-pot clavulanate) .... Two times a day 8)  Hydromet 5-1.5 Mg/25ml Syrp (Hydrocodone-homatropine) .Marland Kitchen.. 1 tsp q 4 hours as needed cough  Patient Instructions: 1)  Please schedule a follow-up appointment as needed .  Prescriptions: HYDROMET 5-1.5 MG/5ML SYRP (HYDROCODONE-HOMATROPINE) 1 tsp q 4 hours as needed cough  #240 x 0   Entered  and Authorized by:   Nelwyn Salisbury MD   Signed by:   Nelwyn Salisbury MD on  01/07/2010   Method used:   Print then Give to Patient   RxID:   [id number] VICODIN HP 10-660 MG TABS (HYDROCODONE-ACETAMINOPHEN) 1 q 6 hours as needed pain  #120 x 3   Entered and Authorized by:   Nelwyn Salisbury MD   Signed by:   Nelwyn Salisbury MD on 01/07/2010   Method used:   Print then Give to Patient   RxID:   [id number] AUGMENTIN 875-125 MG TABS (AMOXICILLIN-POT CLAVULANATE) two times a day  #20 x 0   Entered and Authorized by:   Nelwyn Salisbury MD   Signed by:   Nelwyn Salisbury MD on 01/07/2010   Method used:   Electronically to        [facility]  [phone]* (retail)       [address]       [place], [place]  [address]       Ph: [phone] or [phone]       Fax: [phone]   RxID:   [id number]    Medication Administration  Injection # 1:    Medication: Rocephin  250mg     Diagnosis: ACUTE BRONCHITIS (ICD-466.0)    Route: IM    Site: RUOQ gluteus    Exp Date: 09/12    Lot #: [id number]    Mfr: [email or web address]    Comments: 500mg  given    Patient tolerated injection without complications    Given by: Raechel Ache, RN (January 07, 2010 5:00 PM)  Orders Added: 1)  Est. Patient Level IV [60109] 2)  Prescription Created Electronically [G8553] 3)  Rocephin  250mg  [J0696] 4)  Admin of Therapeutic Inj  intramuscular or subcutaneous [32355]

## 2010-06-27 NOTE — Progress Notes (Signed)
Summary: Pt req script for Cephalexin called in to [facility] [address]  Phone Note Call from Patient Call back at [phone] cell   Caller: Patient Summary of Call: Pt req that the other antibiotic Cephalexin (500mg ) be called in to [facility] on [address].  Initial call taken by: Lucy Antigua,  March 13, 2010 8:21 AM  Follow-up for Phone Call        what would this be for? Follow-up by: Nelwyn Salisbury MD,  March 13, 2010 1:17 PM  Additional Follow-up for Phone Call Additional follow up Details #1::        completed amoxicillin 2 wks  ago  c/o cough in chest  "clear sputum " no fever  Additional Follow-up by: Pura Spice, RN,  March 13, 2010 1:36 PM    Additional Follow-up for Phone Call Additional follow up Details #2::    call in Keflex 500 mg three times a day for 10 days Follow-up by: Nelwyn Salisbury MD,  March 13, 2010 2:25 PM  Additional Follow-up for Phone Call Additional follow up Details #3:: Details for Additional Follow-up Action Taken: done pt aware. Additional Follow-up by: Pura Spice, RN,  March 13, 2010 3:13 PM  New/Updated Medications: KEFLEX 500 MG CAPS (CEPHALEXIN) 1 by mouth three times a day for 10 days Prescriptions: KEFLEX 500 MG CAPS (CEPHALEXIN) 1 by mouth three times a day for 10 days  #30 x 0   Entered by:   Pura Spice, RN   Authorized by:   Nelwyn Salisbury MD   Signed by:   Pura Spice, RN on 03/13/2010   Method used:   Electronically to        [facility]  [phone]* (retail)       [address]       [place], [place]  [address]       Ph: [phone] or [phone]       Fax: [phone]   RxID:   [id number]

## 2010-06-27 NOTE — Letter (Signed)
Summary: [facility]  [facility]   Imported By: Maryln Gottron 05/10/2010 12:47:11  _____________________________________________________________________  External Attachment:    Type:   Image     Comment:   External Document

## 2010-06-27 NOTE — Progress Notes (Signed)
Summary: new rx to [facility]  Phone Note Refill Request   Refills Requested: Medication #1:  SYNTHROID 100 MCG TABS once daily  Medication #2:  RANITIDINE HCL 150 MG TABS two times a day pt needs new rx [facility] [address] [phone]  Initial call taken by: Heron Sabins,  June 06, 2010 10:38 AM  Follow-up for Phone Call        done  Follow-up by: Pura Spice, RN,  June 06, 2010 11:58 AM    New/Updated Medications: SYNTHROID 100 MCG TABS (LEVOTHYROXINE SODIUM) once daily RANITIDINE HCL 150 MG TABS (RANITIDINE HCL) two times a day Prescriptions: RANITIDINE HCL 150 MG TABS (RANITIDINE HCL) two times a day  #60 x 4   Entered by:   Pura Spice, RN   Authorized by:   Nelwyn Salisbury MD   Signed by:   Pura Spice, RN on 06/06/2010   Method used:   Electronically to        [facility] [phone]* (retail)       [address]       [place], [place]  [address]       Ph: [phone]       Fax: [phone]   RxID:   [id number] SYNTHROID 100 MCG TABS (LEVOTHYROXINE SODIUM) once daily  #30 x 4   Entered by:   Pura Spice, RN   Authorized by:   Nelwyn Salisbury MD   Signed by:   Pura Spice, RN on 06/06/2010   Method used:   Electronically to        [facility] [phone]* (retail)       [address]       [place], [place]  [address]       Ph: [phone]       Fax: [phone]   RxID:   [id number]

## 2010-06-27 NOTE — Progress Notes (Signed)
Summary: REQ FOR RETURN CALL  Phone Note Call from Patient   Caller: Patient  [phone] Reason for Call: Talk to Nurse, Lab or Test Results Summary of Call: Pt called to speak with Nurse.... Pt adv that she would like a return call from RN to discuss lab results... Pt adv that she was called and advised that they were normal but she would still like to speak with a nurse ref to same.  Pt can be reached at [phone].  Initial call taken by: Debbra Riding,  August 28, 2009 1:59 PM  Follow-up for Phone Call        spoke with pt - went thru all labs and discussed high fiber , low fat , low cholestrol diet guidelines. KIK Follow-up by: Duard Brady LPN,  August 28, 2009 4:47 PM

## 2010-06-27 NOTE — Assessment & Plan Note (Signed)
Summary: PAIN WHEN COUGHING AND SNEEZING//CCM   Vital Signs:  Patient profile:   77 year old female Weight:      149 pounds BMI:     24.88 O2 Sat:      95 % on Room air Temp:     98.3 degrees F oral BP sitting:   112 / 72  (left arm) Cuff size:   regular  Vitals Entered By: Raechel Ache, RN (September 19, 2009 2:52 PM)  O2 Flow:  Room air CC: C/o discomfort R chest- hurts to take a deep breath or cough.   History of Present Illness: Here for continuing sharp but mild pains in the right ribs just under the breast.These have been going on for about 3 months now. we saw her in February for this and felt it was due to costochondritis, and we gave her a prednisone dose pack. This did not help at all. No cough or SOB or fever.   Allergies: 1)  ! Sulfa  Past History:  Past Medical History: Reviewed history from 08/23/2009 and no changes required. Coronary artery disease, sees Dr. Antoine Poche Anemia-NOS Depression GERD Hyperlipidemia Hypothyroidism cardiac stress test 02-07-08, showed slight ischemia  Osteoarthritis chronic right knee pain, sees Dr. Thurston Hole  Past Surgical History: Reviewed history from 11/25/2007 and no changes required. Cardiac Cath for angina TAH and BSO 1965 colonoscopy 10-10-05 per Dr. Juanda Chance, repeat in 10 yrs  Review of Systems  The patient denies anorexia, fever, weight loss, weight gain, vision loss, decreased hearing, hoarseness, syncope, dyspnea on exertion, peripheral edema, prolonged cough, headaches, hemoptysis, abdominal pain, melena, hematochezia, severe indigestion/heartburn, hematuria, incontinence, genital sores, muscle weakness, suspicious skin lesions, transient blindness, difficulty walking, depression, unusual weight change, abnormal bleeding, enlarged lymph nodes, angioedema, breast masses, and testicular masses.    Physical Exam  General:  Well-developed,well-nourished,in no acute distress; alert,appropriate and cooperative throughout  examination Neck:  No deformities, masses, or tenderness noted. Chest Wall:  tender along the right lateral ribs under the breast. No swelling or crepitus Lungs:  Normal respiratory effort, chest expands symmetrically. Lungs are clear to auscultation, no crackles or wheezes. Heart:  Normal rate and regular rhythm. S1 and S2 normal without gallop, murmur, click, rub or other extra sounds.   Impression & Recommendations:  Problem # 1:  CHEST WALL PAIN, ANTERIOR (ICD-786.52)  Her updated medication list for this problem includes:    Ibuprofen 800 Mg Tabs (Ibuprofen) .Marland Kitchen... 1 by mouth four times daily as needed for pain    Nitroglycerin 0.3 Mg Subl (Nitroglycerin) .Marland Kitchen... As needed for chest pain  Orders: T-Ribs Bilateral 4 Views (71111TC) T-2 View CXR (71020TC)  Complete Medication List: 1)  Ibuprofen 800 Mg Tabs (Ibuprofen) .Marland Kitchen.. 1 by mouth four times daily as needed for pain 2)  Nitroglycerin 0.3 Mg Subl (Nitroglycerin) .... As needed for chest pain 3)  Synthroid 100 Mcg Tabs (Levothyroxine sodium) .... Once daily 4)  Ranitidine Hcl 150 Mg Tabs (Ranitidine hcl) .... Two times a day  Patient Instructions: 1)  We will get Xrays today to look at this more closely

## 2010-06-27 NOTE — Assessment & Plan Note (Signed)
Summary: COUGH, CONGESTION // RS   Vital Signs:  Patient profile:   77 year old female Weight:      149 pounds BMI:     24.88 O2 Sat:      96 % on Room air Temp:     98.2 degrees F oral BP sitting:   114 / 82  (left arm) Cuff size:   regular  Vitals Entered By: Raechel Ache, RN (Oct 01, 2009 11:28 AM)  O2 Flow:  Room air CC: C/o R-sided chest soreness x 2 months- hurts to sneeze or cough   History of Present Illness: Here for continuing sharp pains in the right anterior and lateral chest wall. these have been present for the past 3 months. She still has a mild intermittent dry cough, and coughing casues this pain to get worse. Ibuprofen does not help it anymore. She is not SOB, no fevers. She had xrays of her lungs and ribs recently, and all these were clear.   Allergies: 1)  ! Sulfa  Past History:  Past Medical History: Reviewed history from 08/23/2009 and no changes required. Coronary artery disease, sees Dr. Antoine Poche Anemia-NOS Depression GERD Hyperlipidemia Hypothyroidism cardiac stress test 02-07-08, showed slight ischemia  Osteoarthritis chronic right knee pain, sees Dr. Thurston Hole  Past Surgical History: Reviewed history from 11/25/2007 and no changes required. Cardiac Cath for angina TAH and BSO 1965 colonoscopy 10-10-05 per Dr. Juanda Chance, repeat in 10 yrs  Review of Systems  The patient denies anorexia, fever, weight loss, weight gain, vision loss, decreased hearing, hoarseness, syncope, dyspnea on exertion, peripheral edema, prolonged cough, headaches, hemoptysis, abdominal pain, melena, hematochezia, severe indigestion/heartburn, hematuria, incontinence, genital sores, muscle weakness, suspicious skin lesions, transient blindness, difficulty walking, depression, unusual weight change, abnormal bleeding, enlarged lymph nodes, angioedema, breast masses, and testicular masses.    Physical Exam  General:  Well-developed,well-nourished,in no acute distress;  alert,appropriate and cooperative throughout examination Neck:  No deformities, masses, or tenderness noted. Chest Wall:  tender in the anterior and lateral right chest just above the breast, no masses  Lungs:  Normal respiratory effort, chest expands symmetrically. Lungs are clear to auscultation, no crackles or wheezes. Heart:  Normal rate and regular rhythm. S1 and S2 normal without gallop, murmur, click, rub or other extra sounds.    Impression & Recommendations:  Problem # 1:  CHEST WALL PAIN, ANTERIOR (ICD-786.52)  Her updated medication list for this problem includes:    Ibuprofen 800 Mg Tabs (Ibuprofen) .Marland Kitchen... 1 by mouth four times daily as needed for pain    Nitroglycerin 0.3 Mg Subl (Nitroglycerin) .Marland Kitchen... As needed for chest pain  Orders: Depo- Medrol 80mg  (J1040) Admin of Therapeutic Inj  intramuscular or subcutaneous (04540) Radiology Referral (Radiology)  Complete Medication List: 1)  Ibuprofen 800 Mg Tabs (Ibuprofen) .Marland Kitchen.. 1 by mouth four times daily as needed for pain 2)  Nitroglycerin 0.3 Mg Subl (Nitroglycerin) .... As needed for chest pain 3)  Synthroid 100 Mcg Tabs (Levothyroxine sodium) .... Once daily 4)  Ranitidine Hcl 150 Mg Tabs (Ranitidine hcl) .... Two times a day 5)  Vicodin Hp 10-660 Mg Tabs (Hydrocodone-acetaminophen) .Marland Kitchen.. 1 q 6 hours as needed pain  Patient Instructions: 1)  Use Vicodin for pain control. I advised her to get a mammogram soon, since it has been over a year since her last one. We will set up a contrasted chest CT to evaluate further.  Prescriptions: VICODIN HP 10-660 MG TABS (HYDROCODONE-ACETAMINOPHEN) 1 q 6 hours as needed pain  #  120 x 0   Entered and Authorized by:   Nelwyn Salisbury MD   Signed by:   Nelwyn Salisbury MD on 10/01/2009   Method used:   Print then Give to Patient   RxID:   [id number]    Medication Administration  Injection # 1:    Medication: Depo- Medrol 80mg     Diagnosis: CHEST WALL PAIN, ANTERIOR (ICD-786.52)     Route: IM    Site: RUOQ gluteus    Exp Date: 11/13    Lot #: [id number]    Mfr: [employer]    Comments: 120mg  given    Patient tolerated injection without complications    Given by: Raechel Ache, RN (Oct 01, 2009 12:43 PM)  Orders Added: 1)  Est. Patient Level IV [08657] 2)  Depo- Medrol 80mg  [J1040] 3)  Admin of Therapeutic Inj  intramuscular or subcutaneous [96372] 4)  Radiology Referral [Radiology]

## 2010-06-27 NOTE — Assessment & Plan Note (Signed)
Summary: er fup//ccm   Vital Signs:  Patient profile:   77 year old female O2 Sat:      95 % Temp:     98.1 degrees F Pulse rate:   102 / minute BP sitting:   140 / 80  (left arm) Cuff size:   regular  Vitals Entered By: Pura Spice, RN (June 17, 2010 1:06 PM) CC: post ER follow up for difficulty breathing and dx hiatal hernia    History of Present Illness: Here to follow up an ER visit on 07-16-10 for SOB. Workup was negative except for a large hiatal hernia seen on a CXR. She occasionally gets epigastric discomfort or dyspnea. No nausea or trouble swallowing. Her CBC there was normal.   Allergies: 1)  ! Sulfa  Past History:  Past Medical History: Reviewed history from 08/23/2009 and no changes required. Coronary artery disease, sees Dr. Antoine Poche Anemia-NOS Depression GERD Hyperlipidemia Hypothyroidism cardiac stress test 02-07-08, showed slight ischemia  Osteoarthritis chronic right knee pain, sees Dr. Thurston Hole  Past Surgical History: Reviewed history from 11/25/2007 and no changes required. Cardiac Cath for angina TAH and BSO 1965 colonoscopy 10-10-05 per Dr. Juanda Chance, repeat in 10 yrs  Review of Systems  The patient denies anorexia, fever, weight loss, weight gain, vision loss, decreased hearing, hoarseness, syncope, peripheral edema, prolonged cough, headaches, hemoptysis, abdominal pain, melena, hematochezia, severe indigestion/heartburn, hematuria, incontinence, genital sores, muscle weakness, suspicious skin lesions, transient blindness, difficulty walking, depression, unusual weight change, abnormal bleeding, enlarged lymph nodes, angioedema, breast masses, and testicular masses.    Physical Exam  General:  Well-developed,well-nourished,in no acute distress; alert,appropriate and cooperative throughout examination Neck:  No deformities, masses, or tenderness noted. Chest Wall:  No deformities, masses, or tenderness noted. Lungs:  Normal respiratory effort,  chest expands symmetrically. Lungs are clear to auscultation, no crackles or wheezes. Heart:  Normal rate and regular rhythm. S1 and S2 normal without gallop, murmur, click, rub or other extra sounds. Abdomen:  Bowel sounds positive,abdomen soft and non-tender without masses, organomegaly or hernias noted.   Impression & Recommendations:  Problem # 1:  HIATAL HERNIA (ICD-553.3)  Orders: Surgical Referral (Surgery)  Problem # 2:  HYPOTHYROIDISM (ICD-244.9)  Her updated medication list for this problem includes:    Synthroid 100 Mcg Tabs (Levothyroxine sodium) ..... Once daily  Orders: Venipuncture (16109) TLB-BMP (Basic Metabolic Panel-BMET) (80048-METABOL) TLB-CBC Platelet - w/Differential (85025-CBCD) TLB-Hepatic/Liver Function Pnl (80076-HEPATIC) TLB-TSH (Thyroid Stimulating Hormone) (84443-TSH) Specimen Handling (60454)  Complete Medication List: 1)  Ibuprofen 800 Mg Tabs (Ibuprofen) .Marland Kitchen.. 1 by mouth four times daily as needed for pain 2)  Nitroglycerin 0.3 Mg Subl (Nitroglycerin) .... As needed for chest pain 3)  Lomotil 2.5-0.025 Mg/20ml Liqd (Diphenoxylate-atropine) .... As directed 4)  Synthroid 100 Mcg Tabs (Levothyroxine sodium) .... Once daily 5)  Ranitidine Hcl 150 Mg Tabs (Ranitidine hcl) .... Two times a day 6)  Vicodin Hp 10-660 Mg Tabs (Hydrocodone-acetaminophen) .Marland Kitchen.. 1 q 6 hours as needed pain 7)  Neomycin-polymyxin-dexameth 3.5-10000-0.1 Susp (Neomycin-polymyxin-dexameth) .... 2 drops in eyes q 4 hours as needed  Patient Instructions: 1)  check labs. Refer to Surgery to evaluate    Orders Added: 1)  Est. Patient Level IV [09811] 2)  Venipuncture [36415] 3)  TLB-BMP (Basic Metabolic Panel-BMET) [80048-METABOL] 4)  TLB-CBC Platelet - w/Differential [85025-CBCD] 5)  TLB-Hepatic/Liver Function Pnl [80076-HEPATIC] 6)  TLB-TSH (Thyroid Stimulating Hormone) [84443-TSH] 7)  Specimen Handling [99000] 8)  Surgical Referral [Surgery]

## 2010-06-27 NOTE — Assessment & Plan Note (Signed)
Summary: foot pain/njr   Vital Signs:  Patient profile:   77 year old female O2 Sat:      94 % Temp:     98.6 degrees F Pulse rate:   85 / minute BP sitting:   130 / 82  (left arm)  Vitals Entered By: Pura Spice, RN (May 01, 2010 1:11 PM) CC: went to [facility] stepped off drop off landing wrong twisting rt ankle.per pateint XR were negative   CC:  went to [facility] stepped off drop off landing wrong twisting rt ankle.per pateint XR were negative.  History of Present Illness: Patient presents to clinic as a workin for evaluation of ankle pain. States suffered injury approximately 1 wk ago after stepping down. Seems to described right ankle eversion with resulting pain, soft tissue swelling and ecchymosis.  States was evaluated at local UC and underwent reportedly unremarkable plain radiograph not suggestive of fx.  Given RICE instructions and possible air splint.  Notes adequate pain control with nsaid and narcotic as needed but overall pain has not improved since injury. Notes pain on light weightbearing. Using walker and hard boot from previous injury. Denies other acute joint pain.  Allergies: 1)  ! Sulfa  Review of Systems MS:  See HPI; Complains of joint pain and joint swelling.  Physical Exam  General:  alert, well-developed, and well-nourished.   Head:  normocephalic and atraumatic.     Foot/Ankle Exam  General:    Mild soft tissue swelling involving right foot generalized. +tenderness to palptation medial and lateral malleolar areas. Reproducible tenderness with ankle eversion and inversion.  +ecchymosis along distal lower leg and distal foot. +1 DP pulse noted with intact sensation.   Impression & Recommendations:  Problem # 1:  ANKLE PAIN, RIGHT (ICD-719.47) Injury hx c/w with significant ankle sprain however given concern over wt bearing, direct tenderness along malleolar area (despite reportedly neg radiograph) feel that occult  fx must be considered. Continue current analgesia as needed. Avoid wt bearing until evaulated by orthopedics. Continue RICE.  Orders: Orthopedic Referral (Ortho)  Complete Medication List: 1)  Ibuprofen 800 Mg Tabs (Ibuprofen) .Marland Kitchen.. 1 by mouth four times daily as needed for pain 2)  Nitroglycerin 0.3 Mg Subl (Nitroglycerin) .... As needed for chest pain 3)  Lomotil 2.5-0.025 Mg/62ml Liqd (Diphenoxylate-atropine) .... As directed 4)  Synthroid 100 Mcg Tabs (Levothyroxine sodium) .... Once daily 5)  Ranitidine Hcl 150 Mg Tabs (Ranitidine hcl) .... Two times a day 6)  Vicodin Hp 10-660 Mg Tabs (Hydrocodone-acetaminophen) .Marland Kitchen.. 1 q 6 hours as needed pain 7)  Neomycin-polymyxin-dexameth 3.5-10000-0.1 Susp (Neomycin-polymyxin-dexameth) .... 2 drops in eyes q 4 hours as needed 8)  Keflex 500 Mg Caps (Cephalexin) .Marland Kitchen.. 1 by mouth three times a day for 10 days  Patient Instructions: 1)  Please avoid placing weight on your right foot.  2)  Continue to elevate your right foot and use your splint as directed. 3)  An orthopedic appointment will be made for you.   Orders Added: 1)  Orthopedic Referral [Ortho] 2)  Est. Patient Level IV [04540]

## 2010-06-27 NOTE — Assessment & Plan Note (Signed)
Summary: emp-will fast//ccm   Vital Signs:  Patient profile:   77 year old female Height:      65 inches Weight:      149 pounds BMI:     24.88 Temp:     98.3 degrees F oral BP sitting:   120 / 72  (left arm) Cuff size:   regular  Vitals Entered By: Raechel Ache, RN (August 23, 2009 10:05 AM) CC: CPX, fasting.   History of Present Illness: 77 yr old female for  a cpx. She was here 3 days ago with a biut of diverticulitis. She has been taking Cipro, and she feels alittle better. The abdominal cramps are better, and her BMs are more formed. She asks about seeing a Dermatologist for a skin check, and she asks about a painful lump on the side of her left foot. This has bothered her for several years.   Allergies: 1)  ! Sulfa  Past History:  Past Medical History: Coronary artery disease, sees Dr. Antoine Poche Anemia-NOS Depression GERD Hyperlipidemia Hypothyroidism cardiac stress test 02-07-08, showed slight ischemia  Osteoarthritis chronic right knee pain, sees Dr. Thurston Hole  Past Surgical History: Reviewed history from 11/25/2007 and no changes required. Cardiac Cath for angina TAH and BSO 1965 colonoscopy 10-10-05 per Dr. Juanda Chance, repeat in 10 yrs  Family History: Reviewed history from 04/27/2007 and no changes required. Family History Diabetes 1st degree relative Family History Hypertension Family History of Stroke M 1st degree relative <50 Family History of Heart disease  Social History: Reviewed history from 04/27/2007 and no changes required. Divorced Never Smoked Alcohol use-no Drug use-no  Review of Systems  The patient denies anorexia, fever, weight loss, weight gain, vision loss, decreased hearing, hoarseness, chest pain, syncope, dyspnea on exertion, peripheral edema, prolonged cough, headaches, hemoptysis, melena, hematochezia, severe indigestion/heartburn, hematuria, incontinence, genital sores, muscle weakness, suspicious skin lesions, transient blindness,  difficulty walking, depression, unusual weight change, abnormal bleeding, enlarged lymph nodes, angioedema, breast masses, and testicular masses.    Physical Exam  General:  Well-developed,well-nourished,in no acute distress; alert,appropriate and cooperative throughout examination Head:  Normocephalic and atraumatic without obvious abnormalities. No apparent alopecia or balding. Eyes:  No corneal or conjunctival inflammation noted. EOMI. Perrla. Funduscopic exam benign, without hemorrhages, exudates or papilledema. Vision grossly normal. Ears:  External ear exam shows no significant lesions or deformities.  Otoscopic examination reveals clear canals, tympanic membranes are intact bilaterally without bulging, retraction, inflammation or discharge. Hearing is grossly normal bilaterally. Nose:  External nasal examination shows no deformity or inflammation. Nasal mucosa are pink and moist without lesions or exudates. Mouth:  Oral mucosa and oropharynx without lesions or exudates.  Teeth in good repair. Neck:  No deformities, masses, or tenderness noted. Chest Wall:  No deformities, masses, or tenderness noted. Breasts:  No mass, nodules, thickening, tenderness, bulging, retraction, inflamation, nipple discharge or skin changes noted.   Lungs:  Normal respiratory effort, chest expands symmetrically. Lungs are clear to auscultation, no crackles or wheezes. Heart:  Normal rate and regular rhythm. S1 and S2 normal without gallop, murmur, click, rub or other extra sounds. EKG normal Abdomen:  Bowel sounds positive,abdomen soft and non-tender without masses, organomegaly or hernias noted. Rectal:  No external abnormalities noted. Normal sphincter tone. No rectal masses or tenderness. Heme neg. Genitalia:  Vagina ends in a surgical cuff. no external lesions, no vaginal discharge, no adnexal masses or tenderness, and vaginal atrophy.  Pap smear was obtained from the cuff.  Msk:  No deformity or  scoliosis  noted of thoracic or lumbar spine.   Pulses:  R and L carotid,radial,femoral,dorsalis pedis and posterior tibial pulses are full and equal bilaterally Extremities:  No clubbing, cyanosis, edema noted with normal full range of motion of all joints.  the left foot has pronounced lateral deviation of the great toe, and there is a large tender bunion on the side of the first MTP Neurologic:  No cranial nerve deficits noted. Station and gait are normal. Plantar reflexes are down-going bilaterally. DTRs are symmetrical throughout. Sensory, motor and coordinative functions appear intact. Skin:  Intact without suspicious lesions or rashes. Scattered seborrheic keratoses. Cervical Nodes:  No lymphadenopathy noted Axillary Nodes:  No palpable lymphadenopathy Inguinal Nodes:  No significant adenopathy Psych:  Cognition and judgment appear intact. Alert and cooperative with normal attention span and concentration. No apparent delusions, illusions, hallucinations   Impression & Recommendations:  Problem # 1:  DIVERTICULITIS OF COLON (ICD-562.11)  Orders: Hemoccult Guaiac-1 spec.(in office) (82270)  Problem # 2:  HYPERGLYCEMIA (ICD-790.29)  Orders: TLB-A1C / Hgb A1C (Glycohemoglobin) (83036-A1C)  Problem # 3:  OSTEOARTHRITIS (ICD-715.90)  Her updated medication list for this problem includes:    Ibuprofen 800 Mg Tabs (Ibuprofen) .Marland Kitchen... 1 by mouth four times daily as needed for pain  Problem # 4:  HYPOTHYROIDISM (ICD-244.9)  Her updated medication list for this problem includes:    Synthroid 100 Mcg Tabs (Levothyroxine sodium) ..... Once daily  Problem # 5:  HYPERLIPIDEMIA (ICD-272.4)  Problem # 6:  CORONARY ARTERY DISEASE (ICD-414.00)  Her updated medication list for this problem includes:    Nitroglycerin 0.3 Mg Subl (Nitroglycerin) .Marland Kitchen... As needed for chest pain  Orders: UA Dipstick w/o Micro (automated)  (81003) EKG w/ Interpretation (93000) Venipuncture (66440) TLB-Lipid Panel  (80061-LIPID) TLB-BMP (Basic Metabolic Panel-BMET) (80048-METABOL) TLB-CBC Platelet - w/Differential (85025-CBCD) TLB-Hepatic/Liver Function Pnl (80076-HEPATIC) TLB-TSH (Thyroid Stimulating Hormone) (84443-TSH)  Complete Medication List: 1)  Ibuprofen 800 Mg Tabs (Ibuprofen) .Marland Kitchen.. 1 by mouth four times daily as needed for pain 2)  Nitroglycerin 0.3 Mg Subl (Nitroglycerin) .... As needed for chest pain 3)  Synthroid 100 Mcg Tabs (Levothyroxine sodium) .... Once daily 4)  Cipro 500 Mg Tabs (Ciprofloxacin hcl) .... Two times a day 5)  Ranitidine Hcl 150 Mg Tabs (Ranitidine hcl) .... Two times a day  Other Orders: Podiatry Referral Personal assistant) Dermatology Referral (Derma)  Patient Instructions: 1)  Get labs today. The diverticulitis seems to be improving as expected. Will refer her to Dermatology for a skin exam. Refer to Podiatry for the bunion.  Prescriptions: NITROGLYCERIN 0.3 MG SUBL (NITROGLYCERIN) as needed for chest pain  #50 x 5   Entered and Authorized by:   Nelwyn Salisbury MD   Signed by:   Nelwyn Salisbury MD on 08/23/2009   Method used:   Print then Give to Patient   RxID:   [id number]    Immunization History:  Tetanus/Td Immunization History:    Tetanus/Td:  td (05/26/2008)     Laboratory Results   Urine Tests    Routine Urinalysis   Color: yellow Appearance: Clear Glucose: negative   (Normal Range: Negative) Bilirubin: negative   (Normal Range: Negative) Ketone: negative   (Normal Range: Negative) Spec. Gravity: 1.025   (Normal Range: 1.003-1.035) Blood: trace-lysed   (Normal Range: Negative) Protein: 1+   (Normal Range: Negative) Urobilinogen: 0.2   (Normal Range: 0-1) Nitrite: negative   (Normal Range: Negative) Leukocyte Esterace: negative   (Normal Range: Negative)    Comments: Rita Ohara  August 23, 2009 11:47 AM

## 2010-06-27 NOTE — Progress Notes (Signed)
  Phone Note Call from Patient   Caller: Patient Call For: Nelwyn Salisbury MD Summary of Call: Pt left message that she has a "knot on her foot as big as a baseball".   Called her right back, and got the voice mail. Initial call taken by: Lynann Beaver CMA AAMA,  April 23, 2010 3:31 PM  Follow-up for Phone Call        Left message x 2 to call us back if she still needs to be seen. Follow-up by: Lynann Beaver CMA AAMA,  April 24, 2010 1:08 PM

## 2010-06-27 NOTE — Assessment & Plan Note (Signed)
Summary: fu on abd pain/njr   Vital Signs:  Patient profile:   77 year old female Weight:      150 pounds Temp:     98.5 degrees F oral BP sitting:   120 / 76  (left arm) Cuff size:   regular  Vitals Entered By: Raechel Ache, RN (August 21, 2009 10:09 AM) CC: C/o abdominal pain x 10 days- started after eating shredded wheat and nuts. Vomited yesterday. Stools skinny.   History of Present Illness: Here for several reasons. First she thinks she has diverticulitis since she has had lower abdominal pains for about 10 days. this started after she had shredded wheat for breakfast one day. Her stools have been loose, but no blood seen. No fevers. she vomitted once, but no other nausea. No urinary chenages. Also she wants a rx for a generic acid blocker that would be cheaper than Prevacid OTC.   Allergies: 1)  ! Sulfa  Past History:  Past Medical History: Reviewed history from 04/05/2008 and no changes required. Coronary artery disease, sees Dr. Antoine Poche Anemia-NOS Depression GERD Hyperlipidemia Hypothyroidism cardiac stress test 02-07-08, showed slight ischemia  Osteoarthritis  Past Surgical History: Reviewed history from 11/25/2007 and no changes required. Cardiac Cath for angina TAH and BSO 1965 colonoscopy 10-10-05 per Dr. Juanda Chance, repeat in 10 yrs  Review of Systems  The patient denies anorexia, fever, weight loss, weight gain, vision loss, decreased hearing, hoarseness, chest pain, syncope, dyspnea on exertion, peripheral edema, prolonged cough, headaches, hemoptysis, melena, hematochezia, severe indigestion/heartburn, hematuria, incontinence, genital sores, muscle weakness, suspicious skin lesions, transient blindness, difficulty walking, depression, unusual weight change, abnormal bleeding, enlarged lymph nodes, angioedema, breast masses, and testicular masses.    Physical Exam  General:  Well-developed,well-nourished,in no acute distress; alert,appropriate and cooperative  throughout examination Lungs:  Normal respiratory effort, chest expands symmetrically. Lungs are clear to auscultation, no crackles or wheezes. Heart:  Normal rate and regular rhythm. S1 and S2 normal without gallop, murmur, click, rub or other extra sounds. Abdomen:  soft, normal bowel sounds, no distention, no masses, no guarding, no rigidity, no rebound tenderness, no abdominal hernia, no inguinal hernia, no hepatomegaly, and no splenomegaly.  Mild diffuse tenderness.    Impression & Recommendations:  Problem # 1:  DIVERTICULITIS OF COLON (ICD-562.11)  Problem # 2:  GERD (ICD-530.81)  Her updated medication list for this problem includes:    Ranitidine Hcl 150 Mg Tabs (Ranitidine hcl) .Marland Kitchen..Marland Kitchen Two times a day  Complete Medication List: 1)  Ibuprofen 800 Mg Tabs (Ibuprofen) .Marland Kitchen.. 1 by mouth four times daily as needed for pain 2)  Nitroglycerin 0.3 Mg Subl (Nitroglycerin) .... As needed 3)  Synthroid 100 Mcg Tabs (Levothyroxine sodium) .... Once daily 4)  Cipro 500 Mg Tabs (Ciprofloxacin hcl) .... Two times a day 5)  Ranitidine Hcl 150 Mg Tabs (Ranitidine hcl) .... Two times a day  Patient Instructions: 1)  Avoid particulate foods.  2)  Please schedule a follow-up appointment as needed .  Prescriptions: RANITIDINE HCL 150 MG TABS (RANITIDINE HCL) two times a day  #60 x 11   Entered and Authorized by:   Nelwyn Salisbury MD   Signed by:   Nelwyn Salisbury MD on 08/21/2009   Method used:   Electronically to        [facility]  [phone]* (retail)       [address] [facility],  [place]  [address]       Ph: [phone] or [phone]       Fax: [phone]   RxID:   [id number] CIPRO 500 MG TABS (CIPROFLOXACIN HCL) two times a day  #20 x 0   Entered and Authorized by:   Nelwyn Salisbury MD   Signed by:   Nelwyn Salisbury MD on 08/21/2009   Method used:   Electronically to        [facility]  [phone]* (retail)       [address]       [place], [place]  [address]       Ph: [phone] or [phone]       Fax: [phone]   RxID:   [id number]

## 2010-07-03 ENCOUNTER — Encounter: Payer: Self-pay | Admitting: Cardiology

## 2010-07-03 ENCOUNTER — Other Ambulatory Visit: Payer: Self-pay | Admitting: Surgery

## 2010-07-03 DIAGNOSIS — K449 Diaphragmatic hernia without obstruction or gangrene: Secondary | ICD-10-CM

## 2010-07-04 ENCOUNTER — Ambulatory Visit
Admission: RE | Admit: 2010-07-04 | Discharge: 2010-07-04 | Disposition: A | Discharge status: Home / Self Care | Payer: Medicare Other | Source: Ambulatory Visit | Attending: Surgery | Admitting: Surgery

## 2010-07-04 DIAGNOSIS — K449 Diaphragmatic hernia without obstruction or gangrene: Secondary | ICD-10-CM

## 2010-07-30 DIAGNOSIS — Z8679 Personal history of other diseases of the circulatory system: Secondary | ICD-10-CM | POA: Insufficient documentation

## 2010-07-31 ENCOUNTER — Encounter: Payer: Self-pay | Admitting: Cardiology

## 2010-07-31 ENCOUNTER — Encounter (INDEPENDENT_AMBULATORY_CARE_PROVIDER_SITE_OTHER): Payer: Medicare Other | Admitting: Cardiology

## 2010-07-31 DIAGNOSIS — I6529 Occlusion and stenosis of unspecified carotid artery: Secondary | ICD-10-CM | POA: Insufficient documentation

## 2010-07-31 DIAGNOSIS — I2510 Atherosclerotic heart disease of native coronary artery without angina pectoris: Secondary | ICD-10-CM

## 2010-08-05 ENCOUNTER — Telehealth: Payer: Self-pay | Admitting: Family Medicine

## 2010-08-05 NOTE — Telephone Encounter (Signed)
Pt called and said that she got and has some questions re: some office visit notes she rcvd re: pts lft kidney. Pls call.

## 2010-08-06 NOTE — Telephone Encounter (Signed)
Left mess to return call

## 2010-08-06 NOTE — Assessment & Plan Note (Signed)
Summary: ec6/rapid heart beat/medicare/pt has recs/mj    Visit Type:  Pre-op Evaluation Referring Provider:  Dr. Wenda Low Primary Provider:  Nelwyn Salisbury MD  CC:  CAD.  History of Present Illness: The patient presents for preoperative evaluation prior to repair of a hiatal hernia. I saw her in the past for this. She had 75% LAD stenosis as described below. However, stress perfusion imaging demonstrated no ischemia and so she was managed medically. Since that time she has done well. She denies any chest pressure, neck or arm discomfort. She will occasionally get tachycardia palpitations but these are sporadic and not associated with presyncope or syncope. She is anxious and thinks this could be related. She recently was in the emergency room because she was "sighing" repeatedly. Chest x-ray demonstrated a hiatal hernia. She's had a workup to have this repaired. She was not noted to have any hypoxemia or acute cardiac issues. She does not describe PND or orthopnea. She has no leg pain or edema. She is slightly limited by right knee pain  Current Medications (verified): 1)  Ibuprofen 800 Mg Tabs (Ibuprofen) .Marland Kitchen.. 1 By Mouth Four Times Daily As Needed For Pain 2)  Nitroglycerin 0.3 Mg Subl (Nitroglycerin) .... As Needed For Chest Pain 3)  Lomotil 2.5-0.025 Mg/55ml  Liqd (Diphenoxylate-Atropine) .... As Directed 4)  Synthroid 100 Mcg Tabs (Levothyroxine Sodium) .... Once Daily 5)  Ranitidine Hcl 150 Mg Tabs (Ranitidine Hcl) .... Two Times A Day 6)  Vicodin Hp 10-660 Mg Tabs (Hydrocodone-Acetaminophen) .Marland Kitchen.. 1 Q 6 Hours As Needed Pain 7)  Neomycin-Polymyxin-Dexameth 3.5-10000-0.1 Susp (Neomycin-Polymyxin-Dexameth) .... 2 Drops in Eyes Q 4 Hours As Needed  Allergies: 1)  ! Sulfa 2)  ! Demerol  Past History:  Past Medical History: CORONARY ARTERY DISEASE, (catheterization in 2002, 75% proximal LAD     stenosis, 60% mid stenosis, 40% distal stenosis, right coronary     artery had appropriate  40% stenosis, and 50% stenosis in the mid     segment, the EF is 60%.  Low risk follow stress test 02-07-08, showed slight ischemia  CHEST WALL PAIN, ANTERIOR HYPERLIPIDEMIA  ANGINA, HX OF  HIATAL HERNIA  ANKLE PAIN, RIGHT CONJUNCTIVITIS  BUNION, LEFT FOOT  DIVERTICULITIS OF COLON UNSPEC SYMPTOM ASSOC W/FEMALE GENITAL ORGANS  ALLERGIC RHINITIS HYPERGLYCEMIA ACUTE BRONCHITIS FOOT PAIN OSTEOARTHRITIS FATIGUE FAMILY HISTORY DIABETES 1ST DEGREE RELATIVE LOW BACK PAIN, CHRONIC  HYPOTHYROIDISM GERD DEPRESSION  ANEMIA-NOS  Chronic right knee pain, sees Dr. Thurston Hole Carotid stenosis (0 - 39% B)  Past Surgical History: TAH and BSO 1965 Colonoscopy 10-10-05 per Dr. Juanda Chance, repeat in 10 yrs Foot surgery. Hysterectomy  Family History: Family History Diabetes 1st degree relative Family History Hypertension Family History of Stroke M 1st degree relative <50 Family History of Heart disease (Brother with DM, age 72.  Father with MI late 60s)  Review of Systems       Positive for occasional lightheadedness, , vertigo, tinnitus, cough, constipation, diverticulitis recent ankle sprain. Otherwise as stated in the history of present illness negative growth of systems.  Vital Signs:  Patient profile:   77 year old female Height:      65 inches Weight:      148 pounds Pulse rate:   65 / minute Pulse rhythm:   regular BP sitting:   128 / 76  (left arm) Cuff size:   regular  Vitals Entered By: Judithe Modest CMA (July 31, 2010 10:17 AM)  Physical Exam  General:  Well developed, well nourished,  in no acute distress. Head:  normocephalic and atraumatic Eyes:  PERRLA/EOM intact; conjunctiva and lids normal. Mouth:  Teeth, gums and palate normal. Oral mucosa normal. Neck:  Neck supple, no JVD. No masses, thyromegaly or abnormal cervical nodes. Chest Wall:  no deformities or breast masses noted Lungs:  Clear bilaterally to auscultation and percussion. Abdomen:  Bowel sounds positive;  abdomen soft and non-tender without masses, organomegaly, or hernias noted. No hepatosplenomegaly. Msk:  Back normal, normal gait. Muscle strength and tone normal. Extremities:  No clubbing or cyanosis. Neurologic:  Alert and oriented x 3. Skin:  Intact without lesions or rashes. Cervical Nodes:  no significant adenopathy Inguinal Nodes:  no significant adenopathy Psych:  Normal affect.   Detailed Cardiovascular Exam  Neck    Carotids: Carotids full and equal bilaterally without bruits.      Neck Veins: Normal, no JVD.    Heart    Inspection: no deformities or lifts noted.      Palpation: normal PMI with no thrills palpable.      Auscultation: regular rate and rhythm, S1, S2 without murmurs, rubs, gallops, or clicks.    Vascular    Abdominal Aorta: no palpable masses, pulsations, or audible bruits.      Femoral Pulses: normal femoral pulses bilaterally.      Pedal Pulses: normal pedal pulses bilaterally.      Radial Pulses: normal radial pulses bilaterally.      Peripheral Circulation: no clubbing, cyanosis, or edema noted with normal capillary refill.     EKG  Procedure date:  07/31/2010  Findings:      Sinus rhythm, rate 66, axis within normal limits, intervals within normal limits, no acute ST-T wave changes  Impression & Recommendations:  Problem # 1:  CORONARY ARTERY DISEASE (ICD-414.00) Given her previous coronary disease and was somewhat low functional status she will be stress testing prior to surgery. She would not be able to ambulate on a treadmill and so she will have a pharmacologic stress Orders: Nuclear Stress Test (Nuc Stress Test)  Problem # 2:  CAROTID ARTERY DISEASE (ICD-433.10) I will schedule follow up carotid doppler. Orders: Carotid Duplex (Carotid Duplex)  Problem # 3:  HIATAL HERNIA (ICD-553.3) Preoperative clearance will be pending the results above.  Problem # 4:  HYPERLIPIDEMIA (ICD-272.4) She will get a lipid proflie when she  returns.  Patient Instructions: 1)  Your physician has requested that you have a carotid duplex. This test is an ultrasound of the carotid arteries in your neck. It looks at blood flow through these arteries that supply the brain with blood. Allow one hour for this exam. There are no restrictions or special instructions. 2)  Your physician has requested that you have a Lexiscan myoview.  For further information please visit [email or web address]  Please follow instruction sheet, as given. 3)  Your physician recommends that you return for a FASTING lipid profile: when you come back for your test (272.0)

## 2010-08-06 NOTE — Telephone Encounter (Signed)
Pt called stated was reading reports picked up from [facility] and was concerned about cyst on kidney from chest CT dated 10-04-2009  Report found and reported to pt that Dr Clent Ridges did see the report and reported it as normal report Dr Clent Ridges aware.

## 2010-08-06 NOTE — Letter (Signed)
Summary: Dr. Daphine Deutscher note  Dr. Daphine Deutscher note   Imported By: Kassie Mends 08/01/2010 08:49:40  _____________________________________________________________________  External Attachment:    Type:   Image     Comment:   External Document

## 2010-08-06 NOTE — Telephone Encounter (Signed)
Pt is return gina call

## 2010-08-07 ENCOUNTER — Telehealth (INDEPENDENT_AMBULATORY_CARE_PROVIDER_SITE_OTHER): Payer: Self-pay | Admitting: *Deleted

## 2010-08-08 ENCOUNTER — Telehealth (INDEPENDENT_AMBULATORY_CARE_PROVIDER_SITE_OTHER): Payer: Self-pay | Admitting: *Deleted

## 2010-08-08 ENCOUNTER — Other Ambulatory Visit: Payer: Self-pay | Admitting: Cardiology

## 2010-08-08 ENCOUNTER — Encounter: Payer: Self-pay | Admitting: Internal Medicine

## 2010-08-08 ENCOUNTER — Other Ambulatory Visit (INDEPENDENT_AMBULATORY_CARE_PROVIDER_SITE_OTHER): Payer: Medicare Other

## 2010-08-08 ENCOUNTER — Encounter (INDEPENDENT_AMBULATORY_CARE_PROVIDER_SITE_OTHER): Payer: Medicare Other

## 2010-08-08 ENCOUNTER — Encounter: Payer: Self-pay | Admitting: Cardiology

## 2010-08-08 ENCOUNTER — Ambulatory Visit (HOSPITAL_COMMUNITY): Payer: Medicare Other | Attending: Cardiology

## 2010-08-08 DIAGNOSIS — E7800 Pure hypercholesterolemia, unspecified: Secondary | ICD-10-CM

## 2010-08-08 DIAGNOSIS — R079 Chest pain, unspecified: Secondary | ICD-10-CM

## 2010-08-08 DIAGNOSIS — R0602 Shortness of breath: Secondary | ICD-10-CM

## 2010-08-08 DIAGNOSIS — I6529 Occlusion and stenosis of unspecified carotid artery: Secondary | ICD-10-CM

## 2010-08-08 DIAGNOSIS — I2510 Atherosclerotic heart disease of native coronary artery without angina pectoris: Secondary | ICD-10-CM | POA: Insufficient documentation

## 2010-08-08 LAB — LIPID PANEL
HDL: 50.8 mg/dL (ref >39.00)
Total CHOL/HDL Ratio: 4

## 2010-08-12 ENCOUNTER — Encounter: Payer: Self-pay | Admitting: Cardiology

## 2010-08-13 NOTE — Progress Notes (Signed)
   Pt signed ROI, Mailed Records out to pt toady. Cala Bradford Mesiemore  August 08, 2010 1:47 PM

## 2010-08-13 NOTE — Progress Notes (Signed)
Summary: Nuclear Pre-Procedure  Phone Note Outgoing Call Call back at [facility] Phone [phone]   Call placed by: Stanton Kidney, EMT-P,  August 07, 2010 1:22 PM Action Taken: Phone Call Completed Summary of Call: Left message with information on Myoview Information Sheet (see scanned document for details). Stanton Kidney, EMT-P  August 07, 2010 1:22 PM      Nuclear Med Background Indications for Stress Test: Evaluation for Ischemia, Surgical Clearance  Indications Comments: Pending hiatal hernia procedure  History: Heart Catheterization, Myocardial Perfusion Study  History Comments: 9/02 Heart Cath: EF=60%, N/O CAD 9/09 MPS: EF: 70%, mild ant. ishemia  Symptoms: Palpitations, Rapid HR    Nuclear Pre-Procedure Cardiac Risk Factors: Carotid Disease, Family History - CAD, Lipids Height (in): 65

## 2010-08-13 NOTE — Assessment & Plan Note (Addendum)
Summary: Cardiology Nuclear Testing  Nuclear Med Background Indications for Stress Test: Evaluation for Ischemia, Surgical Clearance  Indications Comments: Pending hiatal hernia procedure  History: Heart Catheterization, Myocardial Perfusion Study  History Comments: 9/02 Heart Cath: EF=60%, N/O CAD 9/09 MPS: EF: 70%, mild ant. ishemia  Symptoms: DOE, Palpitations, Rapid HR    Nuclear Pre-Procedure Cardiac Risk Factors: Carotid Disease, Family History - CAD, Lipids Caffeine/Decaff Intake: None NPO After: 8:00 PM Lungs: clear IV 0.9% NS with Angio Cath: 20g     IV Site: R Antecubital IV Started by: Bonnita Levan, RN Chest Size (in) 40     Cup Size C     Height (in): 65 Weight (lb): 150 BMI: 25.05  Nuclear Med Study 1 or 2 day study:  1 day     Stress Test Type:  Eugenie Birks Reading MD:  Dietrich Pates, MD     Referring MD:  J.Hochrein Resting Radionuclide:  Technetium 40m Tetrofosmin     Resting Radionuclide Dose:  10.7 mCi  Stress Radionuclide:  Technetium 33m Tetrofosmin     Stress Radionuclide Dose:  33 mCi   Stress Protocol  Max Systolic BP: 113 mm Hg Lexiscan: 0.4 mg   Stress Test Technologist:  Milana Na, EMT-P     Nuclear Technologist:  Domenic Polite, CNMT  Rest Procedure  Myocardial perfusion imaging was performed at rest 45 minutes following the intravenous administration of Technetium 74m Tetrofosmin.  Stress Procedure  The patient received IV Lexiscan 0.4 mg over 15-seconds.  Technetium 50m Tetrofosmin injected at 30-seconds.  There were no significant changes with infusion.  Quantitative spect images were obtained after a 45 minute delay.  QPS Raw Data Images:  Images were motion corrected.  Soft tissue (diaphragm, breast) surround heart. Stress Images:  Normal homogeneous uptake in all areas of the myocardium. Rest Images:  Normal homogeneous uptake in all areas of the myocardium. Transient Ischemic Dilatation:  1.10  (Normal <1.22)  Lung/Heart Ratio:   .31  (Normal <0.45)  Quantitative Gated Spect Images QGS EDV:  67 ml QGS ESV:  24 ml QGS EF:  64 %   Overall Impression  Exercise Capacity: Lexiscan with no exercise. BP Response: Normal blood pressure response. Clinical Symptoms: No chest pain ECG Impression: No significant ST segment change suggestive of ischemia. Overall Impression: Normal stress nuclear study.  Appended Document: Cardiology Nuclear Testing Normal nulcear study.  Appended Document: Cardiology Nuclear Testing pt aware of results

## 2010-08-22 NOTE — Miscellaneous (Signed)
Summary: Pravastatin 20  Clinical Lists Changes  Medications: Added new medication of PRAVASTATIN SODIUM 20 MG TABS (PRAVASTATIN SODIUM) once at bedtime - Signed Rx of PRAVASTATIN SODIUM 20 MG TABS (PRAVASTATIN SODIUM) once at bedtime;  #90 x 4;  Signed;  Entered by: Charolotte Capuchin, RN;  Authorized by: Rollene Rotunda, MD, [facility];  Method used: Electronically to [facility] [phone]*, [address] [place], [place]  [address], Ph: [phone], Fax: [phone]    Prescriptions: PRAVASTATIN SODIUM 20 MG TABS (PRAVASTATIN SODIUM) once at bedtime  #90 x 4   Entered by:   Charolotte Capuchin, RN   Authorized by:   Rollene Rotunda, MD, [facility]   Signed by:   Charolotte Capuchin, RN on 08/12/2010   Method used:   Electronically to        [facility] [phone]* (retail)       [address]       [place], [place]  [address]       Ph: [phone]       Fax: [phone]   RxID:   [id number]

## 2010-09-04 ENCOUNTER — Telehealth: Payer: Self-pay | Admitting: Cardiology

## 2010-09-04 NOTE — Telephone Encounter (Signed)
Pt came in Signed ROI, Faxed Stress,Labs over to [facility] @ [phone] 09/04/10/KM

## 2010-09-09 ENCOUNTER — Ambulatory Visit (INDEPENDENT_AMBULATORY_CARE_PROVIDER_SITE_OTHER): Payer: Medicare Other | Admitting: Family Medicine

## 2010-09-09 ENCOUNTER — Encounter: Payer: Self-pay | Admitting: Family Medicine

## 2010-09-09 VITALS — BP 140/80 | HR 95 | Temp 98.8°F | Wt 148.0 lb

## 2010-09-09 DIAGNOSIS — J40 Bronchitis, not specified as acute or chronic: Secondary | ICD-10-CM

## 2010-09-09 MED ORDER — CEPHALEXIN 500 MG PO CAPS: 500.0000 mg | ORAL_CAPSULE | Freq: Three times a day (TID) | ORAL | Status: AC

## 2010-09-09 MED ORDER — HYDROCODONE-HOMATROPINE 5-1.5 MG/5ML PO SYRP: 5.0000 mL | ORAL_SOLUTION | ORAL | Status: DC | PRN

## 2010-09-09 NOTE — Progress Notes (Signed)
  Subjective:    Patient ID: Rachael [name], female    DOB: 09-25-1930, 77 y.o.   MRN: [id number]  HPI Here for 3 days of low grade fevers and a dry cough. No SOB. She is scheduled for a laparoscopic Nissan fundiplication per Dr. Daphine Deutscher on 09-20-10.    Review of Systems  Constitutional: Positive for fever.  HENT: Negative.   Eyes: Negative.   Respiratory: Positive for cough.   Cardiovascular: Negative.        Objective:   Physical Exam  Constitutional: She appears well-developed and well-nourished.  HENT:  Head: Normocephalic and atraumatic.  Right Ear: External ear normal.  Left Ear: External ear normal.  Nose: Nose normal.  Mouth/Throat: Oropharynx is clear and moist. No oropharyngeal exudate.  Eyes: Conjunctivae are normal. Pupils are equal, round, and reactive to light.  Neck: No thyromegaly present.  Cardiovascular: Normal rate, regular rhythm, normal heart sounds and intact distal pulses.   Pulmonary/Chest: Effort normal and breath sounds normal. No respiratory distress. She has no wheezes. She has no rales. She exhibits no tenderness.  Lymphadenopathy:    She has no cervical adenopathy.          Assessment & Plan:  Treat the bronchitis. She should be able to have her surgery as planned.

## 2010-09-12 ENCOUNTER — Other Ambulatory Visit: Payer: Self-pay | Admitting: Surgery

## 2010-09-12 ENCOUNTER — Encounter (HOSPITAL_COMMUNITY): Payer: Medicare Other | Attending: Surgery

## 2010-09-12 ENCOUNTER — Telehealth: Payer: Self-pay | Admitting: Cardiology

## 2010-09-12 DIAGNOSIS — E079 Disorder of thyroid, unspecified: Secondary | ICD-10-CM | POA: Insufficient documentation

## 2010-09-12 DIAGNOSIS — Z79899 Other long term (current) drug therapy: Secondary | ICD-10-CM | POA: Insufficient documentation

## 2010-09-12 DIAGNOSIS — K449 Diaphragmatic hernia without obstruction or gangrene: Secondary | ICD-10-CM | POA: Insufficient documentation

## 2010-09-12 DIAGNOSIS — Z01812 Encounter for preprocedural laboratory examination: Secondary | ICD-10-CM | POA: Insufficient documentation

## 2010-09-12 DIAGNOSIS — K219 Gastro-esophageal reflux disease without esophagitis: Secondary | ICD-10-CM | POA: Insufficient documentation

## 2010-09-12 LAB — CBC
HCT: 46.9 % — ABNORMAL HIGH (ref 36.0–46.0)
MCV: 87 fL (ref 78.0–100.0)
Platelets: 358 10*3/uL (ref 150–400)
RBC: 5.39 MIL/uL — ABNORMAL HIGH (ref 3.87–5.11)
RDW: 13.5 % (ref 11.5–15.5)
WBC: 8.9 10*3/uL (ref 4.0–10.5)

## 2010-09-12 LAB — BASIC METABOLIC PANEL
BUN: 14 mg/dL (ref 6–23)
Chloride: 105 mEq/L (ref 96–112)
GFR calc non Af Amer: 56 mL/min — ABNORMAL LOW (ref >60)
Potassium: 5.2 mEq/L — ABNORMAL HIGH (ref 3.5–5.1)
Sodium: 141 mEq/L (ref 135–145)

## 2010-09-12 LAB — SURGICAL PCR SCREEN: Staphylococcus aureus: NEGATIVE

## 2010-09-12 NOTE — Telephone Encounter (Signed)
Faxed Stress, Carotid, EKG & OV to [facility] @ [facility] ([phone]).

## 2010-09-17 ENCOUNTER — Telehealth: Payer: Self-pay | Admitting: Family Medicine

## 2010-09-17 NOTE — Telephone Encounter (Signed)
Rx Done/phoned in.

## 2010-09-17 NOTE — Telephone Encounter (Signed)
Call in another 240 ml bottle of Hydromet syrup

## 2010-09-17 NOTE — Telephone Encounter (Signed)
Pt called and said that she is still not feeling well. Still has productive cough. Pt is sch to have stomach surgery on Friday. Pt is req a generic cough syrup or other cough med to be called in to [facility] on [address]. Pt req $4.00 med if possible.

## 2010-09-20 ENCOUNTER — Inpatient Hospital Stay (HOSPITAL_COMMUNITY)
Admission: RE | Admit: 2010-09-20 | Discharge: 2010-09-24 | DRG: 328 | Disposition: A | Discharge status: Home / Self Care | Payer: Medicare Other | Source: Ambulatory Visit | Attending: Surgery | Admitting: Surgery

## 2010-09-20 DIAGNOSIS — K449 Diaphragmatic hernia without obstruction or gangrene: Principal | ICD-10-CM | POA: Diagnosis present

## 2010-09-20 LAB — POTASSIUM: Potassium: 5.2 mEq/L — ABNORMAL HIGH (ref 3.5–5.1)

## 2010-09-21 ENCOUNTER — Inpatient Hospital Stay (HOSPITAL_COMMUNITY): Payer: Medicare Other

## 2010-09-21 HISTORY — PX: HIATAL HERNIA REPAIR: SHX195

## 2010-09-21 MED ORDER — IOHEXOL 300 MG/ML  SOLN
50.0000 mL | Freq: Once | INTRAMUSCULAR | Status: AC | PRN
  Administered 2010-09-21: 50 mL via ORAL

## 2010-09-23 ENCOUNTER — Inpatient Hospital Stay (HOSPITAL_COMMUNITY): Payer: Medicare Other

## 2010-09-24 LAB — CBC
HCT: 37.2 % (ref 36.0–46.0)
Hemoglobin: 11.7 g/dL — ABNORMAL LOW (ref 12.0–15.0)
MCH: 27.1 pg (ref 26.0–34.0)
MCHC: 31.5 g/dL (ref 30.0–36.0)
MCV: 86.3 fL (ref 78.0–100.0)
Platelets: 261 K/uL (ref 150–400)
RBC: 4.31 MIL/uL (ref 3.87–5.11)
RDW: 13.7 % (ref 11.5–15.5)
WBC: 7.7 K/uL (ref 4.0–10.5)

## 2010-09-26 NOTE — Discharge Summary (Signed)
  NAME:  Rachael [name], Rachael [name] NO.:  [id number]  MEDICAL RECORD NO.:  [id number]           PATIENT TYPE:  I  LOCATION:  1523                         FACILITY:  [facility]  PHYSICIAN:  Thornton Park. Daphine Deutscher, MD  DATE OF BIRTH:  1930-09-09  DATE OF ADMISSION:  09/20/2010 DATE OF DISCHARGE:  09/24/2010                              DISCHARGE SUMMARY   ADMITTING DIAGNOSIS:  Large hiatal hernia with intrathoracic stomach.  PROCEDURES:  Laparoscopic repair of hiatal hernia with a Cook patch and Nissen fundoplication.  Upper endoscopy.  COURSE IN HOSPITAL:  This 77 year old lady underwent this operation on Friday, September 20, 2010.  She did well.  On postoperative day #1 she had a swallow, which showed contrast going through her wrap.  Everything looked to be in good order.  She was given clear liquids and watched on subsequent two more days and was ready for discharge on Tuesday, Sep 24, 2010.  CONDITION ON DISCHARGE:  Condition good.  DISCHARGE MEDICATIONS:  She was given a prescription for Roxicet elixir to take for pain.  DIET:  She was advised to stay on clear liquids this week and go back on a pureed diet next week.  FOLLOWUP:  Follow up in the office in about 3 weeks.     Thornton Park Daphine Deutscher, MD     MBM/MEDQ  D:  09/24/2010  T:  09/24/2010  Job:  [id number]  Electronically Signed by Luretha Murphy MD on 09/26/2010 08:43:49 AM

## 2010-09-26 NOTE — Op Note (Signed)
NAME:  Rachael [name], Rachael [name] NO.:  [id number]  MEDICAL RECORD NO.:  [id number]           PATIENT TYPE:  I  LOCATION:  0001                         FACILITY:  [facility]  PHYSICIAN:  Thornton Park. Daphine Deutscher, MD  DATE OF BIRTH:  Jan 08, 1931  DATE OF PROCEDURE:  09/20/2010 DATE OF DISCHARGE:                              OPERATIVE REPORT   PREOPERATIVE DIAGNOSIS:  Large type 3 mixed hiatal hernia.  PROCEDURE:  Laparoscopic takedown and repair of large type 3 mixed hiatal hernia, closure of the diaphragm with pledgeted sutures of 2-0 Surgidac, four posteriorly and one anteriorly, Nissen fundoplication over a #50 bougie with three sutures, upper endoscopy by Dr. Ezzard Standing, application of [facility] biologic diaphragmatic patch sutured with five sutures anchoring it.  SURGEON:  Thornton Park. Daphine Deutscher, M.D.  ASSISTANT:  Sandria Bales. Ezzard Standing, M.D.  ANESTHESIA:  General endotracheal.  DESCRIPTION OF PROCEDURE:  Rachael [name] is a 77 year old lady taken to room 1 on Friday, September 20, 2010, and given general anesthesia.  The abdomen was prepped with PCMX and draped sterilely.  Access to the abdomen was achieved through the left upper quadrant with a 0-degrees 5- mm scope.  Additional 5-mm trocars were used and upsized to an 11 under the left rib and then another 11 lower on the right.  Through these we found the stomach to be basically up in the chest.  We were able to reduce this into the abdomen leaving a very large defect.  I used a harmonic scalpel to go around and excise the sac.  There was maybe a little bit of sac remnant left up in the chest, but most came down and was removed.  We got around what we thought was the EG junction with a Penrose drain and then got good length and Dr. Ezzard Standing went up and endoscoped her and we found the EG junction was going to be well below the diaphragm.  The vagus nerve posteriorly was visualized and was preserved.  Anteriorly we did not remove all of that little sac,  so I think we did not disturb it either.  With the Penrose drain around there, we retracted that and I began the closure posteriorly using pledgeted sutures of Surgidac and Endo Stitch held in place with tie knots.  Four such sutures were placed posteriorly and then one anteriorly and that kind of snugged this diaphragm down nicely.  Dr. Shireen Quan then passed a #50 lighted bougie into the stomach.  There was still a little bit of room in the diaphragm even with that in place.  We then brought around the fundus cardia and then invaginated the distal esophagus with the cardia.  Three sutures using the Endo Stitch taking purchases of the esophagus as well as the stomach held in place with tie knots were used to secure that.  To complete the closure of the diaphragm, I did suture a piece of Cook biological mesh up on the diaphragm, tacking it on either side of the diaphragm with the waist anteriorly to buttress that potential weak spot and then sutured it laterally.  There was one little area where I thought the cardia  had a little serosal tear, which I put a little stitch in, free stitch of 2-0 Vicryl with a free tie.  Tisseel was applied over the areas of the plication and over that area.  Everything looked good.  Minimal blood loss.  Nathanson retractor which was used to retract the liver was withdrawn.  All of the port sites were injected with 0.5% Marcaine with epinephrine.  The wounds were closed with 4-0 Vicryl, Benzoin and Steri- Strips.  The patient tolerated the procedure well and was taken to the recovery room in satisfactory condition.     Thornton Park Daphine Deutscher, MD     MBM/MEDQ  D:  09/20/2010  T:  09/20/2010  Job:  [id number]  cc:   Jeannett Senior A. Clent Ridges, MD [address] [place] [address]  Electronically Signed by Luretha Murphy MD on 09/26/2010 08:43:40 AM

## 2010-10-02 ENCOUNTER — Encounter: Payer: Self-pay | Admitting: Family Medicine

## 2010-10-02 ENCOUNTER — Ambulatory Visit (INDEPENDENT_AMBULATORY_CARE_PROVIDER_SITE_OTHER): Payer: Medicare Other | Admitting: Family Medicine

## 2010-10-02 VITALS — BP 142/86 | HR 84 | Temp 99.2°F | Wt 143.0 lb

## 2010-10-02 DIAGNOSIS — R300 Dysuria: Secondary | ICD-10-CM

## 2010-10-02 DIAGNOSIS — N390 Urinary tract infection, site not specified: Secondary | ICD-10-CM

## 2010-10-02 LAB — POCT URINALYSIS DIPSTICK
Nitrite, UA: NEGATIVE
Urobilinogen, UA: 1
pH, UA: 5.5

## 2010-10-02 MED ORDER — CIPROFLOXACIN HCL 500 MG PO TABS: 500.0000 mg | ORAL_TABLET | Freq: Two times a day (BID) | ORAL | Status: DC

## 2010-10-02 NOTE — Progress Notes (Signed)
  Subjective:    Patient ID: Rachael [name], female    DOB: 09-23-1930, 77 y.o.   MRN: [id number]  HPI Here to follow up a hospital stay from 09-20-10 to 09-24-10 for a laparoscopic hiatal hernia repair with a Cook patch and Nissan fundiplication per Dr. Luretha Murphy. This went very well, and she was sent home on a clear liquid diet. Now she has advanced this to a pureed diet, including mashed potatoes. Her pain has reduced to simple abdominal soreness. No SOB or trouble swallowing. BMs are normal. For the past 2 days however she has had fever to 101 degrees, urgency to urinate, and burning. No N or V. Drinking plenty of water.    Review of Systems  Constitutional: Positive for fever. Negative for diaphoresis and appetite change.  Respiratory: Negative.   Cardiovascular: Negative.   Gastrointestinal: Negative.   Genitourinary: Positive for dysuria and frequency.       Objective:   Physical Exam  Constitutional: She appears well-developed and well-nourished.  Cardiovascular: Normal rate, regular rhythm, normal heart sounds and intact distal pulses.   Pulmonary/Chest: Effort normal and breath sounds normal.  Abdominal: Soft. Bowel sounds are normal. She exhibits no distension and no mass. There is no rebound and no guarding.       Slightly tender in the epigastrium. The surgical wounds look good, no discharge or bleeding          Assessment & Plan:  Treat the UTI with Cipro. Await the culture results.

## 2010-10-04 LAB — URINE CULTURE

## 2010-10-08 ENCOUNTER — Telehealth: Payer: Self-pay | Admitting: *Deleted

## 2010-10-08 NOTE — Assessment & Plan Note (Signed)
[facility]                         GASTROENTEROLOGY OFFICE NOTE   NAME:[name], [name]                    MRN:          [id number]  DATE:08/30/2007                            DOB:          09-02-1930    The patient is a 77 year old white female who has had rectal pain which  has bothered her off and on for several years, more so recently.  It is  associated with rectal fullness and urge to have a bowel movement, but  any efforts to have a bowel movement do not result in any evacuation.  She had prior hemorrhoidectomy in the past and colon resection in 1964  for some gynecological emergency.  Colonoscopy in May of 2007 did not  detect any anastomosis, but she did have moderately severe to severe  diverticulosis of the sigmoid colon.  She still had first grade  hemorrhoids in spite of prior hemorrhoidectomy.  The patient denies  being constipated.  She denies rectal bleeding.  There is no family  history of colon cancer.  She has severe degenerative joint disease of  the lumbosacral spine for which she takes ibuprofen and most recently  diclofenac 50 mg once or twice a day.  The anti-inflammatories have  improved the rectal pain which is described as burning.  It happens when  she sits and usually goes away when she lays down.  She describes her  stools being rather thready and decreased in caliber.   MEDICATIONS:  1. Diclofenac 50 mg one p.o. b.i.d.  2. Pepcid AC q.a.m.  3. Ibuprofen 800 mg p.r.n.  4. Anusol C suppositories p.r.n.   PAST MEDICAL HISTORY:  Significant for angina, arthritis, hemorrhoids.   PAST SURGICAL HISTORY:  Colon resection in 1964, hysterectomy.   FAMILY HISTORY:  Negative for colon cancer.   SOCIAL HISTORY:  Divorced with four children.  She works as a Engineer, maintenance.  She does not smoke or does not drink alcohol.   REVIEW OF SYSTEMS:  Positive for frequent cough, arthritic complaints,  back pain, severe  fatigue.   PHYSICAL EXAMINATION:  VITAL SIGNS:  Blood pressure 112/78, pulse 80,  and weight 162 pounds.  GENERAL:  She was alert and oriented in no acute distress.  HEENT:  Sclerae anicteric.  NECK:  Supple with no adenopathy.  LUNGS:  Clear to auscultation.  There was some tenderness in both sacral  iliac joints in the pelvis.  HEART:  Quiet precordium.  Normal S1 and normal S2.  ABDOMEN:  Soft with normal active bowel sounds.  Tenderness in the right  lower quadrant.  No palpable mass.  Normal active bowel sounds.  Left  lower and upper quadrants were normal.  RECTAL:  Anoscopic examination shows normal perianal area with no  prolapse.  Rectal tone was normal.  On digital examination her coccygeal  bone was quite tender.  Pressure on it resulted in severe pain.  Rectal  ampulla was spacious with soft and hemoccult negative stool.  I could  not appreciate any internal masses or fullness.  The patient was not  impacted.   IMPRESSION:  1.  A 77 year old white female with several problems pertaining to her      rectum.  The rectal burning is most likely coccydynia from      arthritic process in her tail bone.  2. Incomplete evacuation with urge to defecate, may be related to      rectocele.  3. Decreased caliber of the stools may be a result of an insufficient      fiber intake as well as due to  moderately severe to severe      diverticular disease of the left colon.  As of last colonoscopy in      July 2007 she did not have any significant obstruction and      clinically she still does not sound obstructed.   PLAN:  1. Continue Diclofenac 50 mg twice a day, but patient refuses to      extend this.  Instead she may continue ibuprofen 800 mg t.i.d.  2. Benefiber 3 grams, samples given.  She needs to stay on it on daily      basis to improve the caliber of her stools.  3. No need for repeat colonoscopy.  If the symptoms continue, we may      consider doing a flexible  sigmoidoscopy.  4. Continue Anusol HC suppositories until completed.     Hedwig Morton. Juanda Chance, MD  Electronically Signed    DMB/MedQ  DD: 08/30/2007  DT: 08/30/2007  Job #: [id number]   cc:   Jeannett Senior A. Clent Ridges, MD

## 2010-10-08 NOTE — Telephone Encounter (Signed)
LMOM for Pt to call Re: lab results

## 2010-10-08 NOTE — Assessment & Plan Note (Signed)
[facility]                            CARDIOLOGY OFFICE NOTE   NAME:Rachael [name], Rachael [name]                    MRN:          [id number]  DATE:01/24/2008                            DOB:          1930-11-03    PRIMARY CARE PHYSICIAN:  Jeannett Senior A. Clent Ridges, MD   REASON FOR PRESENTATION:  Evaluate the patient with coronary artery  disease and chest pain.   HISTORY OF PRESENT ILLNESS:  The patient is a 77 years old.  It has been  a couple of years since she saw me.  She has had nonobstructive coronary  artery disease on catheterization in 2002.  This is described below.  She is somewhat inactive.  She does still working and walks on her job,  but otherwise is relatively sedentary.  She has some right knee pain.  She says when she does things like getting up and down stairs she will  get short of breath.  This has been relatively chronic.  She gets  fatigued when she does a lot of activity.  She does not have any PND or  orthopnea.  She recently was treated for possible bronchitis and had  some Augmentin.  She said that she developed some chest soreness.  She  was having discomfort in her upper chest.  There was no associated  nausea, vomiting, or diaphoresis.  She has not had any discomfort  radiating up into her jaw.  She has had no arm discomfort.  She has had  some soreness to cough.  She had a low-grade fever but was not producing  anything with her cough and has not been having any swelling.  She has  had no palpitation, presyncope, or syncope.   PAST MEDICAL HISTORY:  1. Coronary arteries (catheterization in 2002, 75% proximal LAD      stenosis, 60% mid stenosis, 40% distal stenosis, right coronary      artery had appropriate 40% stenosis, and 50% stenosis in the mid      segment, the EF is 60%).  2. Mild iron deficiency anemia.  3. Hyperlipidemia.  4. Back injury.  5. Hiatal hernia.  6. Bowel surgery (She could not elaborate).  7. Foot surgery.  8. An  0-39% bilateral carotid artery stenosis.   ALLERGIES/INTOLERANCES:  SULFA.   MEDICATIONS:  Ibuprofen 800 mg daily (She stopped taking all of her  other medications because of cost).   REVIEW OF SYSTEMS:  As stated in the HPI, and otherwise, negative for  all other systems.   PHYSICAL EXAMINATION:  GENERAL:  The patient is in no distress.  VITAL SIGNS:  Blood pressure 116/72, heart rate 65 and regular, weight  150 pounds, and body mass index 25.  HEENT:  Eyes are unremarkable.  Pupils equal, round, and reactive to  light.  Fundi not visualized.  Oral mucosa unremarkable.  NECK:  No jugular venous distention at 45 degrees.  Carotid upstroke  brisk and symmetric.  No bruits or thyromegaly.  LYMPHATICS:  No cervical, axillary, or inguinal adenopathy.  LUNGS:  Clear to auscultation bilaterally.  BACK:  No costovertebral angle tenderness.  CHEST:  Unremarkable.  HEART:  PMI not displaced or sustained and S1 and S2 within normal  limits.  No S3, no S4, no clicks, no rubs, and no murmurs.  ABDOMEN:  Flat, positive bowel sounds, and normal in frequency and  pitch.  No bruits, rebound, guarding, or midline pulsatile mass.  No  hepatomegaly and no splenomegaly.  SKIN:  No rashes and no nodules.  EXTREMITIES:  A 2+ pulses throughout.  No edema, cyanosis, or clubbing.  NEURO:  Oriented to person, place, and time.  Cranial nerves II-XII  grossly intact.  Motor grossly intact.   EKG:  Sinus rhythm, rate 65, short PR interval, axis is within normal  limits, and no acute ST wave changes.   ASSESSMENT/PLAN:  1. Chest discomfort.  The patient has chest comfort, it is somewhat      atypical.  However, she had fairly advanced nonobstructive coronary      artery disease 7 years ago.  She would not be able to walk on a      treadmill because of her knee pain.  She needs a stress test.      Therefore, she will have an adenosine Cardiolite.  Further      evaluation will be based on these results.  2.  Carotid artery stenosis.  The patient had some mild carotid artery      stenosis, but it has been several years since her Doppler.  I will      go ahead and repeat this.  3. Dyslipidemia.  The patient's last LDL that I see was 149.  She came      off of her pravastatin.  I have convinced her to restart this.  She      is going to get a fasting lipid profile.  We will follow up with a      goal LDL less than 100 and HDL greater than 40.  4. Risk reduction.  I have encouraged her to start taking her aspirin      again.  5. Hypothyroidism.  She was on Synthroid at one point but stopped      taking this.  I have encouraged her to follow with Dr. Clent Ridges for      reinitiation of this.  6. Followup.  I will see the patient at least yearly or sooner if she      has any increasing problems or abnormal stress test results.     Rollene Rotunda, MD, [facility]  Electronically Signed    JH/MedQ  DD: 01/24/2008  DT: 01/25/2008  Job #: [id number]   cc:   Jeannett Senior A. Clent Ridges, MD

## 2010-10-08 NOTE — Telephone Encounter (Signed)
Message copied by Burnard Leigh on Tue Oct 08, 2010 12:48 PM ------      Message from: Dwaine Deter      Created: Tue Oct 08, 2010  8:42 AM       Her culture grew a common bacteria that should respond to the Cipro she is taking

## 2010-10-09 NOTE — Telephone Encounter (Signed)
Pt called back and said that she was not quite awake when nurse called about lab results. Pt is req for the nurse to call back with lab info.

## 2010-10-09 NOTE — Telephone Encounter (Signed)
patient  Is aware 

## 2010-10-11 NOTE — Discharge Summary (Signed)
. [facility]  Patient:    Rachael [name], BRAMBLE Visit Number: [id number] MRN: [id number]          Service Type: MED Location: [phone] Attending Physician:  Rollene Rotunda Dictated by:   Tereso Newcomer, P.A. Admit Date:  02/16/2001 Disc. Date: 02/19/01   CC:         Winn Jock. Smitty Cords, M.D.   Discharge Summary  DATE OF BIRTH:  24-Feb-1931  REASON FOR ADMISSION:  Unstable angina.  DISCHARGE DIAGNOSES: 1. Coronary artery disease to be treated medically. 2. Hyperlipidemia. 3. Mild iron-deficiency anemia noted this admission.  PROCEDURES:  Cardiac catheterization by Dr. Simona Huh on February 18, 2001, revealing left main short, LAD ostial, 75%, 50% proximal, 60% mid, 40% distal circumflex 40% mid, 40% distal, RCA ectatic in proximal segment with 40% stenosis followed by 50% stenosis.  He had 60% by left ventriculogram, no MR.  HISTORY OF PRESENT ILLNESS:  This 77 year old female with no prior cardiac history presented to the emergency room on February 16, 2001, with complaints of chest pain.  She had been participating in water aerobics for about two weeks. She denied any prior history of chest pain.  Her symptoms on the day of admission seemed different from her previous symptoms that she had with GERD. She described left-sided, initially sharp chest pain that radiated to her left neck, left upper extremity and fingers.  The pain occurred while she was in the pool.  She had no diaphoresis or nausea.  She had a total of three sublingual nitroglycerin with improvement in her symptoms.  PHYSICAL EXAMINATION:  VITAL SIGNS:  On initial exam in the emergency room, her blood pressure was 126/72, pulse 77, respirations 20.  NECK:  Without bruits.  HEART:  Regular rate and rhythm without murmur.  LUNGS:  Clear to auscultation bilaterally.  ABDOMEN:  Soft and nontender.  EXTREMITIES:  Without edema.  LABORATORY DATA:  EKG showed  normal sinus rhythm, heart rate 72, 0.5 mm ST depression in lead II, otherwise no specific ST-T wave changes.  Chest x-ray: No acute disease, positive hiatal hernia.  HOSPITAL COURSE:  The patient was admitted and placed on IV nitroglycerin, heparin, aspirin, and Lopressor.  It was felt that she was suffering from unstable angina and that she would need cardiac catheterization to further evaluate her pain.  She ruled out for MI by enzymes.  er hemoglobin and hematocrit were noted to be slightly abnormal with a hemoglobin of 11.4 and a hematocrit of 34.3.  Iron studies were ordered.  The patient had a low-normal iron of 44 mcg/dl, normal TIBC at 440 mcg/dl, but a low percent saturation at 14%.  Therefore, she was started on iron therapy. Her lipid profile returned revealing a total cholesterol of 186, triglycerides 71, HDL 50, and LDL 122.  She was placed on Pravachol 20 mg q.h.s.  She remained stable without any further chest pain and went for a cardiac catheterization on February 18, 2001.  Dr. Diona Browner prerformed the procedure, and she tolerated the procedure well and had no immediate complications.  Dr. Diona Browner noted that he was concerned about the close proximity of the LAD stenosis to the left main in terms of percutaneous coronary intervention. Therefore, he received the films with Dr. Juanda Chance.  Dr. Antoine Poche saw the patient on September 26 and noted that Dr. Juanda Chance suggested a Cardiolite prior to thinking about revascularization.  Therefore, the patient underwent an adenosine Cardiolite on February 19, 2001.  This showed no ischemia and an EF of 56%.  Therefore, the patients heparin was discontinued, and she was ambulated through the halls.  At the time of this dictation, the plan was to discharge the patient to home if she ambulated through the halls without chest pain or shortness of breath.  She would need close followup with Dr. Antoine Poche in the office within the next week.   She would also need followup with her primary care physician, Dr. Smitty Cords, in one to two weeks for her mild iron-deficiency anemia.  She would be sent home on 1. Protonix 40 mg daily in place of Pepcid due to her history of GERD and diagnosis of mild iron-deficiency anemia.  LABORATORY DATA:  White blood cell count 7800, hemoglobin 10.9, hematocrit 33.1, platelet count 351,000, MCV 80.2, RDW 13.9.  INR 1.2.  Sodium 141, potassium 3.7, chloride 108, CO2 26, glucose 106, BUN 8, creatinine 0.8, total bilirubin 0.4, alkaline phosphatase 63, AST 18, ALT 15, total protein 5.8, albumin 3.1, calcium 8.8.  CK-MB and troponin I negative x 2.  Lipid profile as noted above.  DISCHARGE MEDICATIONS: 1. Coated aspirin 325 mg q.d. 2. Lopressor 25 mg b.i.d. 3. Protonix 40 mg q.d. 4. Imdur 30 mg q.d. 5. Pravachol 20 mg q.h.s. 6. Iron sulfate 325 mg b.i.d. 7. Nitroglycerin p.r.n. chest pain. 8. Tylenol p.r.n. pain. 9. Vicodin b.i.d. p.r.n. pain.  ACTIVITY:  As tolerated.  DIET:  Low fat, low sodium.  DISCHARGE INSTRUCTIONS:  The patient is to call our office for any groin swelling, bleeding, or bruising.  She has been advised to stop taking Pepcid and Celebrex for now.  Protonix has replaced Pepcid due to her mild iron-deficiency anemia.  She has been asked to see Dr. Smitty Cords to follow up on that.  As noted above, her CAD is being treated medically, and she is having close followup with Dr. Antoine Poche within a week for that.  FOLLOWUP:  She will follow up with Dr. Antoine Poche on Monday, October 7, at 10:45 a.m.  She will see Dr. Smitty Cords in one to two weeks, and she has been asked to call for an appointment. Dictated by:   Tereso Newcomer, P.A. Attending Physician:  Rollene Rotunda DD:  02/19/01 TD:  02/19/01 Job: [id number]

## 2010-10-11 NOTE — Cardiovascular Report (Signed)
. [facility]  Patient:    Rachael [name], Rachael [name] Visit Number: [id number] MRN: [id number]          Service Type: MED Location: [phone] Attending Physician:  Rollene Rotunda Dictated by:   Jonelle Sidle, M.D. Proc. Date: 02/18/01 Admit Date:  02/16/2001   CC:         Fayrene Fearing C. Smitty Cords, M.D.   Cardiac Catheterization  DATE OF BIRTH:  1930-06-19  INDICATIONS:  The patient is a 77 year old woman with no prior cardiac history who presents with recent onset exertional chest discomfort noted during a rehabilitation program that was started subsequent to a fractured left hip. The patient is referred for cardiac catheterization to define her coronary anatomy.  PROCEDURES: 1. Left heart catheterization. 2. Left ventriculography. 3. Selective coronary angiography.  CARDIOLOGIST: Jonelle Sidle, M.D.  DESCRIPTION OF PROCEDURE:  After informed consent was obtained, the patient was taken to the cardiac catheterization lab where she was prepped and draped in the usual sterile fashion.  The area about the right femoral artery was anesthetized with 1% lidocaine, and a 6-French sheath was placed in the right femoral artery via the modified Seldinger technique.  An angled pigtail catheter was used to record left ventricular and aortic pressures.  A left ventriculogram was also performed in the RAO projection.  Following this, selective coronary angiography was performed using JL4 and JR4 catheters. The procedure was tolerated well without obvious complications.  HEMODYNAMICS:  Left ventricle 127/11 mmHg (preangiogrpahy), aorta 127/70 mmHg.  ANGIOGRAPHIC FINDINGS: 1. The left main is a short vessel and has minor lumina irregularities. 2. The left anterior descending is a medium caliber vessel that has a 75%    ostial stenosis, 50% proximal stenosis, 60% stenosis at the mid vessel    at the takeoff of a diagonal branch, and a 40% more distal  stenosis.  A    large septal perforator is noted. 3. The circumflex coronary artery is a medium caliber vessel that has a    20% mid vessel stenosis and a 40% more distal stenosis.  Essentially, the    vessel is in the distribution of a large marginal branch without much    in the A-V groove proper. 4. The right coronary artery has an ectatic proximal segment with a 40%    stenosis.  This section of ectasia is followed by a 50% mid vessel    stenosis.  The posterior descending branch is supplied by the right    coronary artery.  LEFT VENTRICULOGRAPHY:  Left ventriculography reveals normal left ventricular contraction with an ejection fraction of 60%.  There is no significant mitral regurgitation noted.  DIAGNOSES: 1. Single-vessel obstructive coronary artery disease as described above. 2. Normal left ventricular contraction with no significant mitral    regurgitation.  RECOMMENDATIONS:  Will review films with Dr. Juanda Chance.  I am concerned about the close proximity of the ostial LAD stenosis to the left main in terms of its favorability for percutaneous coronary intervention.  The patient may actually require a surgical approach.  Will follow up with further recommendations. Dictated by:   Jonelle Sidle, M.D. Attending Physician:  Rollene Rotunda DD:  02/18/01 TD:  02/18/01 Job: [id number]

## 2010-10-22 ENCOUNTER — Other Ambulatory Visit: Payer: Medicare Other | Admitting: *Deleted

## 2010-11-19 ENCOUNTER — Other Ambulatory Visit: Payer: Self-pay | Admitting: Family Medicine

## 2010-12-31 ENCOUNTER — Other Ambulatory Visit: Payer: Self-pay | Admitting: Family Medicine

## 2011-02-24 ENCOUNTER — Ambulatory Visit (INDEPENDENT_AMBULATORY_CARE_PROVIDER_SITE_OTHER): Payer: Medicare Other

## 2011-02-24 ENCOUNTER — Ambulatory Visit: Payer: Medicare Other | Admitting: Internal Medicine

## 2011-02-24 ENCOUNTER — Inpatient Hospital Stay (INDEPENDENT_AMBULATORY_CARE_PROVIDER_SITE_OTHER)
Admission: RE | Admit: 2011-02-24 | Discharge: 2011-02-24 | Disposition: A | Discharge status: Home / Self Care | Payer: Medicare Other | Source: Ambulatory Visit | Attending: Family Medicine | Admitting: Family Medicine

## 2011-02-24 DIAGNOSIS — M79609 Pain in unspecified limb: Secondary | ICD-10-CM

## 2011-02-26 ENCOUNTER — Telehealth: Payer: Self-pay | Admitting: Family Medicine

## 2011-02-26 NOTE — Telephone Encounter (Signed)
Pt was seen in the ER and was referred to an orthopedic and would like to have recommendations  Where she would go please contact.

## 2011-02-26 NOTE — Telephone Encounter (Signed)
Pls advise.  

## 2011-02-27 NOTE — Telephone Encounter (Signed)
Spoke with pt

## 2011-02-27 NOTE — Telephone Encounter (Signed)
Have her make an OV for me to evaluate this  

## 2011-02-28 ENCOUNTER — Ambulatory Visit (INDEPENDENT_AMBULATORY_CARE_PROVIDER_SITE_OTHER): Payer: Medicare Other | Admitting: Family Medicine

## 2011-02-28 ENCOUNTER — Encounter: Payer: Self-pay | Admitting: Family Medicine

## 2011-02-28 VITALS — BP 130/84 | HR 100 | Temp 98.3°F

## 2011-02-28 DIAGNOSIS — M25473 Effusion, unspecified ankle: Secondary | ICD-10-CM

## 2011-02-28 DIAGNOSIS — M109 Gout, unspecified: Secondary | ICD-10-CM

## 2011-02-28 DIAGNOSIS — M25579 Pain in unspecified ankle and joints of unspecified foot: Secondary | ICD-10-CM

## 2011-02-28 LAB — URIC ACID: Uric Acid, Serum: 6.1 mg/dL (ref 2.4–7.0)

## 2011-02-28 MED ORDER — HYDROCODONE-ACETAMINOPHEN 10-660 MG PO TABS: 1.0000 | ORAL_TABLET | Freq: Four times a day (QID) | ORAL | Status: DC | PRN

## 2011-02-28 MED ORDER — PREDNISONE (PAK) 10 MG PO TABS: ORAL_TABLET | ORAL | Status: DC

## 2011-02-28 MED ORDER — METHYLPREDNISOLONE ACETATE 80 MG/ML IJ SUSP
120.0000 mg | Freq: Once | INTRAMUSCULAR | Status: AC
  Administered 2011-02-28: 120 mg via INTRAMUSCULAR

## 2011-02-28 NOTE — Progress Notes (Signed)
  Subjective:    Patient ID: Rachael [name], female    DOB: March 30, 1931, 77 y.o.   MRN: [id number]  HPI Here for intermittent right ankle pain. She fell and twisted this ankle in November 2011, and Xrays at that time revealed no fractures. This seemed to heal for the most part, although she has had some pain in the ankle ever since. Then several weeks ago the ankle suddenly swelled up and became extremely painful. She related that she ate an all you can eat seafood buffet with a lot of shellfish around this time. She saw the ER recently and had Xrays again, and these revealed only soft tissue swelling. Using Vicodin for pain and walking with a rolling walker. The ankle still hurts a lot.    Review of Systems  Constitutional: Negative.   Musculoskeletal: Positive for joint swelling and arthralgias.       Objective:   Physical Exam  Constitutional: She appears well-developed and well-nourished.  Musculoskeletal:       The entire right ankle is swollen, though not red. The medial ankle is warm and is quite tender. ROM is limited by pain.           Assessment & Plan:  This is probably gout at the site of previous trauma. Given a steroid shot today to be followed by an oral taper pack. Use Vicodin prn . Get a uric acid level today

## 2011-03-04 ENCOUNTER — Telehealth: Payer: Self-pay | Admitting: Family Medicine

## 2011-03-04 NOTE — Telephone Encounter (Signed)
Pt requesting to be contacted with results from appt on 02/28/11

## 2011-03-05 ENCOUNTER — Telehealth: Payer: Self-pay | Admitting: Family Medicine

## 2011-03-05 NOTE — Telephone Encounter (Signed)
Pt is aware of results. 

## 2011-03-05 NOTE — Telephone Encounter (Signed)
Spoke with patient, lab results given.  Advised pt per Dr. Clent Ridges she does not need a f/u appt this week to see him.  Pt is inquiring is she has any diet restrictions(i.e limit seafood and/or salt)

## 2011-03-05 NOTE — Telephone Encounter (Signed)
Just limit shellfish (crab, oysters, shrimp, etc)

## 2011-03-05 NOTE — Telephone Encounter (Signed)
Message copied by Baldemar Friday on Wed Mar 05, 2011 11:23 AM ------      Message from: Gershon Crane A      Created: Sun Mar 02, 2011  5:58 PM       Normal. I still think she has gout, but this means she will not need a daily medication for it

## 2011-03-06 NOTE — Telephone Encounter (Signed)
I went over info about the shellfish and now she wants to know if she needs to be on Allopurinol?

## 2011-03-07 NOTE — Telephone Encounter (Signed)
No, she does not need Allopurinol since her uric acid level is not high

## 2011-03-07 NOTE — Telephone Encounter (Signed)
Spoke with pt

## 2011-03-14 ENCOUNTER — Other Ambulatory Visit: Payer: Self-pay | Admitting: Family Medicine

## 2011-03-14 ENCOUNTER — Ambulatory Visit (INDEPENDENT_AMBULATORY_CARE_PROVIDER_SITE_OTHER): Payer: Medicare Other | Admitting: Family Medicine

## 2011-03-14 ENCOUNTER — Encounter: Payer: Self-pay | Admitting: Family Medicine

## 2011-03-14 VITALS — BP 114/78 | HR 91 | Temp 98.1°F | Wt 143.0 lb

## 2011-03-14 DIAGNOSIS — M25579 Pain in unspecified ankle and joints of unspecified foot: Secondary | ICD-10-CM

## 2011-03-14 MED ORDER — COLCHICINE 0.6 MG PO TABS: 0.6000 mg | ORAL_TABLET | Freq: Two times a day (BID) | ORAL | Status: DC | PRN

## 2011-03-14 NOTE — Telephone Encounter (Signed)
Pt wants to know if she can try Allopurinol? She talked with pharmacy and this medication is cheaper than the other.

## 2011-03-14 NOTE — Telephone Encounter (Addendum)
Pt was given rx for colchicine cost is 239.00 with coupon. Pt stated she cannot afford. Pt would like nurse to return her call today.

## 2011-03-14 NOTE — Telephone Encounter (Signed)
Spoke with pt

## 2011-03-14 NOTE — Telephone Encounter (Signed)
Pt is aware nurse will call her back °

## 2011-03-14 NOTE — Telephone Encounter (Signed)
I suggested she see a Podiatrist today but she did not want to. I do not know what to do at this point without blindly trying expensive medications. Please call her and insist that we refer her to a foot specialist

## 2011-03-14 NOTE — Progress Notes (Signed)
  Subjective:    Patient ID: Rachael [name], female    DOB: November 17, 1930, 77 y.o.   MRN: [id number]  HPI Here to follow up on pain in the right foot and ankle. At our last visit we felt this represented gout and she was given a Prednisone taper. Her uric acid level was normal. The steroids have helped the swelling and pain a good deal, but it is still bothering her.    Review of Systems  Constitutional: Negative.   Musculoskeletal: Positive for joint swelling and arthralgias.       Objective:   Physical Exam  Constitutional: She appears well-developed and well-nourished.       Walks with a rolling walker  Musculoskeletal:       Tender around the left lateral malleolus with some mild edema.           Assessment & Plan:  This still seems to be gout, so we will try Colcrys bid .

## 2011-03-14 NOTE — Telephone Encounter (Signed)
NO we cannot use Allopurinol now because if this is an acute gout attack that would make it worse. Plus, we do not really know if this is gout or not. That is why I want her to see a foot specialist so they can draw a sample of fluid out to be analyzed

## 2011-03-17 ENCOUNTER — Ambulatory Visit: Payer: Medicare Other | Admitting: Family Medicine

## 2011-03-18 ENCOUNTER — Telehealth: Payer: Self-pay | Admitting: Family Medicine

## 2011-03-18 DIAGNOSIS — M25579 Pain in unspecified ankle and joints of unspecified foot: Secondary | ICD-10-CM

## 2011-03-18 NOTE — Telephone Encounter (Signed)
Spoke with pt

## 2011-03-18 NOTE — Telephone Encounter (Signed)
Tell her I referred her to Rheumatology. Camelia Eng will call her with appt info

## 2011-03-18 NOTE — Telephone Encounter (Signed)
Pt requesting a referral for her gout . Pt requesting you contact her

## 2011-04-08 ENCOUNTER — Other Ambulatory Visit: Payer: Self-pay | Admitting: Family Medicine

## 2011-06-19 ENCOUNTER — Encounter: Payer: Self-pay | Admitting: Family Medicine

## 2011-06-19 ENCOUNTER — Ambulatory Visit (INDEPENDENT_AMBULATORY_CARE_PROVIDER_SITE_OTHER): Payer: Medicare Other | Admitting: Family Medicine

## 2011-06-19 VITALS — BP 112/78 | HR 95 | Temp 97.8°F | Wt 146.0 lb

## 2011-06-19 DIAGNOSIS — L98499 Non-pressure chronic ulcer of skin of other sites with unspecified severity: Secondary | ICD-10-CM

## 2011-06-19 DIAGNOSIS — E039 Hypothyroidism, unspecified: Secondary | ICD-10-CM

## 2011-06-19 DIAGNOSIS — M109 Gout, unspecified: Secondary | ICD-10-CM

## 2011-06-19 DIAGNOSIS — E785 Hyperlipidemia, unspecified: Secondary | ICD-10-CM | POA: Diagnosis not present

## 2011-06-19 LAB — CBC WITH DIFFERENTIAL/PLATELET
Basophils Absolute: 0.1 10*3/uL (ref 0.0–0.1)
Eosinophils Absolute: 0.2 10*3/uL (ref 0.0–0.7)
Eosinophils Relative: 1.8 % (ref 0.0–5.0)
HCT: 45.3 % (ref 36.0–46.0)
Lymphs Abs: 2.6 10*3/uL (ref 0.7–4.0)
MCHC: 32.8 g/dL (ref 30.0–36.0)
MCV: 90.8 fl (ref 78.0–100.0)
Monocytes Absolute: 0.5 10*3/uL (ref 0.1–1.0)
Neutrophils Relative %: 68 % (ref 43.0–77.0)
Platelets: 345 10*3/uL (ref 150.0–400.0)
RDW: 15.3 % — ABNORMAL HIGH (ref 11.5–14.6)
WBC: 10.4 10*3/uL (ref 4.5–10.5)

## 2011-06-19 LAB — HEPATIC FUNCTION PANEL
AST: 19 U/L (ref 0–37)
Bilirubin, Direct: 0 mg/dL (ref 0.0–0.3)
Total Bilirubin: 0.8 mg/dL (ref 0.3–1.2)

## 2011-06-19 LAB — LIPID PANEL
HDL: 49.7 mg/dL (ref >39.00)
Total CHOL/HDL Ratio: 4
VLDL: 38 mg/dL (ref 0.0–40.0)

## 2011-06-19 LAB — POCT URINALYSIS DIPSTICK
Bilirubin, UA: NEGATIVE
Ketones, UA: NEGATIVE
Leukocytes, UA: NEGATIVE
Spec Grav, UA: 1.02
pH, UA: 5

## 2011-06-19 LAB — BASIC METABOLIC PANEL
BUN: 13 mg/dL (ref 6–23)
GFR: 65.04 mL/min (ref >60.00)
Potassium: 4.5 mEq/L (ref 3.5–5.1)
Sodium: 142 mEq/L (ref 135–145)

## 2011-06-19 LAB — URIC ACID: Uric Acid, Serum: 4.6 mg/dL (ref 2.4–7.0)

## 2011-06-19 MED ORDER — ALLOPURINOL 100 MG PO TABS: 100.0000 mg | ORAL_TABLET | Freq: Every day | ORAL | Status: DC

## 2011-06-19 MED ORDER — CEPHALEXIN 500 MG PO CAPS: 500.0000 mg | ORAL_CAPSULE | Freq: Three times a day (TID) | ORAL | Status: DC

## 2011-06-19 NOTE — Progress Notes (Signed)
  Subjective:    Patient ID: Rachael [name], female    DOB: 09-Jul-1930, 78 y.o.   MRN: [id number]  HPI Here for one month of a painful spot on the right 3rd toe. She has been dealing with chronic gouty arthropathy in the right ankle, and we sent her to see Dr. Dierdre Forth several months ago. He injected the ankle with Kenalog and gave her an oral prednisone taper. This has been helpful for the ankle pain. Then this toe started to bother her. No trauma.    Review of Systems  Constitutional: Negative.   Musculoskeletal: Positive for arthralgias.       Objective:   Physical Exam  Constitutional: She appears well-developed and well-nourished.       Gait is normal   Musculoskeletal:       The right medial ankle remains mildly swollen but not tender or warm. The tip of the right 3rd toe has a large callous, and the area is tender          Assessment & Plan:  Cover for cellulitis with keflex. Refer to Podiatry to remove the callous

## 2011-06-20 LAB — LDL CHOLESTEROL, DIRECT: Direct LDL: 136.4 mg/dL

## 2011-06-25 ENCOUNTER — Encounter: Payer: Self-pay | Admitting: Family Medicine

## 2011-06-25 DIAGNOSIS — M7750 Other enthesopathy of unspecified foot: Secondary | ICD-10-CM | POA: Diagnosis not present

## 2011-06-25 DIAGNOSIS — M2040 Other hammer toe(s) (acquired), unspecified foot: Secondary | ICD-10-CM | POA: Diagnosis not present

## 2011-06-25 DIAGNOSIS — L84 Corns and callosities: Secondary | ICD-10-CM | POA: Diagnosis not present

## 2011-06-25 NOTE — Progress Notes (Signed)
Quick Note:  Left voice message and put a copy of results in mail. ______ 

## 2011-07-03 DIAGNOSIS — M7750 Other enthesopathy of unspecified foot: Secondary | ICD-10-CM | POA: Diagnosis not present

## 2011-07-05 ENCOUNTER — Other Ambulatory Visit: Payer: Self-pay | Admitting: Family Medicine

## 2011-08-14 DIAGNOSIS — M19049 Primary osteoarthritis, unspecified hand: Secondary | ICD-10-CM | POA: Diagnosis not present

## 2011-08-14 DIAGNOSIS — M1710 Unilateral primary osteoarthritis, unspecified knee: Secondary | ICD-10-CM | POA: Diagnosis not present

## 2011-08-15 ENCOUNTER — Telehealth: Payer: Self-pay | Admitting: Family Medicine

## 2011-08-15 MED ORDER — HYDROCODONE-ACETAMINOPHEN 10-660 MG PO TABS: 1.0000 | ORAL_TABLET | Freq: Four times a day (QID) | ORAL | Status: DC | PRN

## 2011-08-15 MED ORDER — ALLOPURINOL 100 MG PO TABS: 100.0000 mg | ORAL_TABLET | Freq: Every day | ORAL | Status: DC

## 2011-08-15 NOTE — Telephone Encounter (Signed)
I sent scripts to [facility] pharmacy and then had to cancel them. I resent to [facility].

## 2011-08-15 NOTE — Telephone Encounter (Signed)
Call in Allopurinol #30 with 11 rf, also Vicodin #120 with 5 rf

## 2011-08-15 NOTE — Telephone Encounter (Signed)
Pt called req script for allopurinol (ZYLOPRIM) 100 MG tablet #30, Hydrocodone-Acetaminophen (VICODIN HP) 10-660 MG TABS, with refills to [facility] on [address].

## 2011-08-21 ENCOUNTER — Telehealth: Payer: Self-pay | Admitting: Cardiology

## 2011-08-21 NOTE — Telephone Encounter (Signed)
error 

## 2011-09-01 ENCOUNTER — Encounter: Payer: Medicare Other | Admitting: Physician Assistant

## 2011-12-04 ENCOUNTER — Ambulatory Visit: Payer: Medicare Other | Admitting: Cardiology

## 2012-01-16 ENCOUNTER — Telehealth: Payer: Self-pay | Admitting: Family Medicine

## 2012-01-16 MED ORDER — RANITIDINE HCL 150 MG PO TABS: 150.0000 mg | ORAL_TABLET | Freq: Two times a day (BID) | ORAL | Status: DC

## 2012-01-16 NOTE — Telephone Encounter (Signed)
I sent script e-scribe. 

## 2012-01-16 NOTE — Telephone Encounter (Signed)
Done, in your box

## 2012-01-16 NOTE — Telephone Encounter (Signed)
The note is ready for pick up and I spoke with pt.

## 2012-01-16 NOTE — Telephone Encounter (Signed)
Pt requesting refill on ranitidine (ZANTAC) 150 MG tablet   [facility] [address]

## 2012-01-16 NOTE — Telephone Encounter (Signed)
Pt will be flying in the near future and is requesting a letter from the doctor stating that she will need a wheelchair for get through the airport and that she has difficulty climbing steps and will need assistance

## 2012-02-16 ENCOUNTER — Encounter: Payer: Self-pay | Admitting: Family Medicine

## 2012-02-16 ENCOUNTER — Other Ambulatory Visit: Payer: Self-pay | Admitting: Family Medicine

## 2012-02-16 ENCOUNTER — Ambulatory Visit (INDEPENDENT_AMBULATORY_CARE_PROVIDER_SITE_OTHER): Payer: Medicare Other | Admitting: Family Medicine

## 2012-02-16 VITALS — BP 112/70 | HR 88 | Temp 98.3°F | Wt 152.0 lb

## 2012-02-16 DIAGNOSIS — S9030XA Contusion of unspecified foot, initial encounter: Secondary | ICD-10-CM

## 2012-02-16 DIAGNOSIS — I2510 Atherosclerotic heart disease of native coronary artery without angina pectoris: Secondary | ICD-10-CM | POA: Diagnosis not present

## 2012-02-16 DIAGNOSIS — M1990 Unspecified osteoarthritis, unspecified site: Secondary | ICD-10-CM

## 2012-02-16 DIAGNOSIS — M109 Gout, unspecified: Secondary | ICD-10-CM

## 2012-02-16 DIAGNOSIS — R350 Frequency of micturition: Secondary | ICD-10-CM | POA: Diagnosis not present

## 2012-02-16 LAB — POCT URINALYSIS DIPSTICK
Blood, UA: NEGATIVE
Nitrite, UA: NEGATIVE
Spec Grav, UA: >=1.03
Urobilinogen, UA: 0.2
pH, UA: 5

## 2012-02-16 MED ORDER — NITROGLYCERIN 0.3 MG SL SUBL: 0.3000 mg | SUBLINGUAL_TABLET | SUBLINGUAL | Status: DC | PRN

## 2012-02-16 MED ORDER — CIPROFLOXACIN HCL 500 MG PO TABS: 500.0000 mg | ORAL_TABLET | Freq: Two times a day (BID) | ORAL | Status: AC

## 2012-02-16 MED ORDER — IBUPROFEN 800 MG PO TABS: 800.0000 mg | ORAL_TABLET | Freq: Three times a day (TID) | ORAL | Status: DC | PRN
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
## 2012-02-17 NOTE — Progress Notes (Signed)
Quick Note:  I spoke with pt ______ 

## 2012-02-18 ENCOUNTER — Telehealth: Payer: Self-pay | Admitting: Cardiology

## 2012-02-18 LAB — URINE CULTURE: Colony Count: 2000

## 2012-02-18 NOTE — Progress Notes (Signed)
Quick Note:  I spoke with pt ______ 

## 2012-02-18 NOTE — Telephone Encounter (Signed)
Rachael [name] states she has been having left sided chest pain and fast irregular heart rate x 3 days.  She states it was constant pain without relief until last night when it stopped 1-2 hours after taking 2 nitro.  She states she woke up without pain but states her chest feels sore like it has been in pain.  She has also had sob for three days which stopped last night also.

## 2012-02-18 NOTE — Telephone Encounter (Signed)
plz return call to patient [phone]   Patient having pain on left side of heart, SOB, rapid heart beat, 2 nitros taken last night 10pm. Patient is very concerned

## 2012-02-18 NOTE — Telephone Encounter (Signed)
F/u   Pt called back about carotid test

## 2012-02-18 NOTE — Telephone Encounter (Signed)
Pt to be seen tomorrow per Dr Shirlee Latch.  No PA or NP appts available so pt was scheduled with Dr Antoine Poche at 12:15pm.  She was notified of appt date and time and told to go to the ER if symptoms returned before appt.  She agrees.

## 2012-02-19 ENCOUNTER — Ambulatory Visit (INDEPENDENT_AMBULATORY_CARE_PROVIDER_SITE_OTHER): Payer: Medicare Other | Admitting: Cardiology

## 2012-02-19 ENCOUNTER — Encounter: Payer: Self-pay | Admitting: Cardiology

## 2012-02-19 ENCOUNTER — Encounter (HOSPITAL_COMMUNITY): Payer: Self-pay | Admitting: Cardiology

## 2012-02-19 VITALS — BP 125/85 | HR 82 | Ht 65.0 in | Wt 150.1 lb

## 2012-02-19 DIAGNOSIS — R079 Chest pain, unspecified: Secondary | ICD-10-CM

## 2012-02-19 NOTE — Patient Instructions (Addendum)
Continue current medications as listed.  Your physician has requested that you have a lexiscan myoview. For further information please visit [email or web address]. Please follow instruction sheet, as given.  Follow up in 1 year with Dr Hochrein.  You will receive a letter in the mail 2 months before you are due.  Please call us when you receive this letter to schedule your follow up appointment.  

## 2012-02-19 NOTE — Progress Notes (Signed)
HPI The patient presents for followup of her nonobstructive coronary disease. I saw her last in March of 2012. She was preop for a hiatal hernia repair. Stress perfusion study at that time demonstrated a well preserved ejection fraction and no evidence of ischemia or infarct. She is considering now having right knee surgery. Because of the this and some chest discomfort she scheduled followup.  She reports she had chest discomfort starting slightly on Monday night. It came on again Tuesday night and lasted most of the night until she took 2 sublingual nitroglycerin. She said it was sharp. It was left upper chest. There was no radiation. There was no associated symptoms. She hasn't had any more of this for the last 2 days or so. She denies any new shortness of breath, PND or orthopnea. She's not complaining of palpitations that she had frequently. She's had no presyncope or syncope. However, she's very limited in her activities with her knee pain.  Allergies  Allergen Reactions  . Meperidine Hcl   . Sulfonamide Derivatives     Current Outpatient Prescriptions  Medication Sig Dispense Refill  . allopurinol (ZYLOPRIM) 100 MG tablet Take 1 tablet (100 mg total) by mouth daily.  30 tablet  11  . ciprofloxacin (CIPRO) 500 MG tablet Take 1 tablet (500 mg total) by mouth 2 (two) times daily.  20 tablet  0  . Hydrocodone-Acetaminophen (VICODIN HP) 10-660 MG TABS Take 1 tablet by mouth every 6 (six) hours as needed.  120 each  5  . ibuprofen (ADVIL,MOTRIN) 800 MG tablet Take 1 tablet (800 mg total) by mouth every 8 (eight) hours as needed for pain.  90 tablet  5  . levothyroxine (SYNTHROID, LEVOTHROID) 100 MCG tablet TAKE ONE TABLET BY MOUTH EVERY DAY  30 tablet  11  . nitroGLYCERIN (NITROSTAT) 0.3 MG SL tablet Place 1 tablet (0.3 mg total) under the tongue every 5 (five) minutes as needed for chest pain.  50 tablet  11  . ranitidine (ZANTAC) 150 MG tablet Take 1 tablet (150 mg total) by mouth 2 (two)  times daily.  60 tablet  6    Past Medical History  Diagnosis Date  . GERD (gastroesophageal reflux disease)   . Gout     has seen Dr. Alben Deeds    Past Surgical History  Procedure Date  . Hiatal hernia repair 09-21-10    per Dr. Luretha Murphy, lap [employer]     ROS:  As stated in the HPI and negative for all other systems.  PHYSICAL EXAM BP 125/85  Pulse 82  Ht 5\' 5"  (1.651 m)  Wt 150 lb 1.9 oz (68.094 kg)  BMI 24.98 kg/m2 GENERAL:  Well appearing HEENT:  Pupils equal round and reactive, fundi not visualized, oral mucosa unremarkable NECK:  No jugular venous distention, waveform within normal limits, carotid upstroke brisk and symmetric, no bruits, no thyromegaly LYMPHATICS:  No cervical, inguinal adenopathy LUNGS:  Clear to auscultation bilaterally BACK:  No CVA tenderness CHEST:  Unremarkable HEART:  PMI not displaced or sustained,S1 and S2 within normal limits, no S3, no S4, no clicks, no rubs, no murmurs ABD:  Flat, positive bowel sounds normal in frequency in pitch, no bruits, no rebound, no guarding, no midline pulsatile mass, no hepatomegaly, no splenomegaly EXT:  2 plus pulses throughout, no edema, no cyanosis no clubbing SKIN:  No rashes no nodules NEURO:  Cranial nerves II through XII grossly intact, motor grossly intact throughout PSYCH:  Cognitively intact, oriented to person  place and time   EKG:  Sinus rhythm, rate 82, axis within normal limits, intervals within normal limits, no acute. 02/19/2012   ASSESSMENT AND PLAN   CORONARY ARTERY DISEASE The patient's chest pain was somewhat atypical. However, she has nonobstructive disease as documented on cath in 2002. As she is considering having knee surgery and with this discomfort she needs repeat stress testing. However, she would not be a little walk on the treadmill. She will have a [employer].   CAROTID ARTERY DISEASE  The patient has mild nonobstructive carotid stenosis.  She is due to have  follow up carotid Dopplers next year.

## 2012-02-26 ENCOUNTER — Ambulatory Visit (HOSPITAL_COMMUNITY): Payer: Medicare Other | Attending: Cardiology | Admitting: Radiology

## 2012-02-26 VITALS — BP 126/77 | HR 75 | Ht 65.0 in | Wt 152.0 lb

## 2012-02-26 DIAGNOSIS — I2510 Atherosclerotic heart disease of native coronary artery without angina pectoris: Secondary | ICD-10-CM | POA: Insufficient documentation

## 2012-02-26 DIAGNOSIS — R079 Chest pain, unspecified: Secondary | ICD-10-CM

## 2012-02-26 DIAGNOSIS — R42 Dizziness and giddiness: Secondary | ICD-10-CM | POA: Diagnosis not present

## 2012-02-26 DIAGNOSIS — R000 Tachycardia, unspecified: Secondary | ICD-10-CM | POA: Diagnosis not present

## 2012-02-26 MED ORDER — TECHNETIUM TC 99M SESTAMIBI GENERIC - CARDIOLITE
11.0000 | Freq: Once | INTRAVENOUS | Status: AC | PRN
  Administered 2012-02-26: 11 via INTRAVENOUS

## 2012-02-26 MED ORDER — TECHNETIUM TC 99M SESTAMIBI GENERIC - CARDIOLITE
33.0000 | Freq: Once | INTRAVENOUS | Status: AC | PRN
  Administered 2012-02-26: 33 via INTRAVENOUS

## 2012-02-26 MED ORDER — REGADENOSON 0.4 MG/5ML IV SOLN
0.4000 mg | Freq: Once | INTRAVENOUS | Status: AC
  Administered 2012-02-26: 0.4 mg via INTRAVENOUS

## 2012-02-26 NOTE — Progress Notes (Signed)
[facility] SITE 3 NUCLEAR MED [id number] [place] [address] [phone]  Cardiology Nuclear Med Study  Rachael [name] is a 78 y.o. female     MRN : [id number]     DOB: January 18, 1931  Procedure Date: 02/26/2012  Nuclear Med Background Indication for Stress Test:  Evaluation for Ischemia and Pending Clearance for Right Knee Surgery by Dr. Salvatore Marvel History:  '02 Cath:n/o CAD, EF=60%; '12 MPS:No ischemia, EF=56% Cardiac Risk Factors: Carotid Disease and Lipids  Symptoms:  Chest Pain/Upper (R) Shoulder (last episode of chest discomfort was last week), Dizziness, Fatigue, Nausea/Vomiting and Rapid HR    Nuclear Pre-Procedure Caffeine/Decaff Intake:  None NPO After: 7:00pm   Lungs:  Clear. O2 Sat: 96% on room air. IV 0.9% NS with Angio Cath:  22g  IV Site: R Forearm  IV Started by:  Stanton Kidney, EMT-P  Chest Size (in):  42 Cup Size: C  Height: 5\' 5"  (1.651 m)  Weight:  152 lb (68.947 kg)  BMI:  Body mass index is 25.29 kg/(m^2). Tech Comments:  NA    Nuclear Med Study 1 or 2 day study: 1 day  Stress Test Type:  Lexiscan  Reading MD: Marca Ancona, MD  Order Authorizing Provider:  Rollene Rotunda, MD  Resting Radionuclide: Technetium 39m Sestamibi  Resting Radionuclide Dose: 11.0 mCi   Stress Radionuclide:  Technetium 42m Sestamibi  Stress Radionuclide Dose: 33.0 mCi           Stress Protocol Rest HR: 75 Stress HR: 95  Rest BP: 126/77 Stress BP: 124/76  Exercise Time (min): n/a METS: n/a   Predicted Max HR: 141 bpm % Max HR: 67.38 bpm Rate Pressure Product: 62952   Dose of Adenosine (mg):  n/a Dose of Lexiscan: 0.4 mg  Dose of Atropine (mg): n/a Dose of Dobutamine: n/a   Stress Test Technologist: Smiley Houseman, CMA-N  Nuclear Technologist:  Domenic Polite, CNMT     Rest Procedure:  Myocardial perfusion imaging was performed at rest 45 minutes following the intravenous administration of Technetium 31m Sestamibi.  Rest ECG: No  acute changes.  Stress Procedure:  The patient received IV Lexiscan 0.4 mg over 15-seconds.  Technetium 54m Sestamibi was injected at 30-seconds.  There were no significant changes with Lexiscan.  Quantitative spect images were obtained after a 45 minute delay.  Stress ECG: No significant change from baseline ECG  QPS Raw Data Images:  Normal; no motion artifact; normal heart/lung ratio. Stress Images:  Normal homogeneous uptake in all areas of the myocardium. Rest Images:  Normal homogeneous uptake in all areas of the myocardium. Subtraction (SDS):  There is no evidence of scar or ischemia. Transient Ischemic Dilatation (Normal <1.22):  1.04 Lung/Heart Ratio (Normal <0.45):  0.36  Quantitative Gated Spect Images QGS EDV:  59 ml QGS ESV:  17 ml  Impression Exercise Capacity:  Lexiscan with no exercise. BP Response:  Normal blood pressure response. Clinical Symptoms:  Nausea ECG Impression:  No significant ST segment change suggestive of ischemia. Comparison with Prior Nuclear Study: No significant change from previous study  Overall Impression:  Normal stress nuclear study.  LV Ejection Fraction: 72%.  LV Wall Motion:  NL LV Function; NL Wall Motion  Marca Ancona 02/26/2012

## 2012-02-27 NOTE — Progress Notes (Signed)
Quick Note:  I spoke with pt to give results and she said that now she has a cold, no fever, just coughing. ______

## 2012-03-01 ENCOUNTER — Telehealth (INDEPENDENT_AMBULATORY_CARE_PROVIDER_SITE_OTHER): Payer: Self-pay | Admitting: General Surgery

## 2012-03-01 NOTE — Telephone Encounter (Signed)
Patient called in today c/o reflux after eating meals with nausea sometimes but not always...patient stated this has been going on for 3months now and I offered pt the first available appt with you 03/15/12 which was only 2weeks away...patient accepted appt and was put on a cancellation list...patient was okay with this

## 2012-03-09 DIAGNOSIS — M25569 Pain in unspecified knee: Secondary | ICD-10-CM | POA: Diagnosis not present

## 2012-03-10 ENCOUNTER — Ambulatory Visit (INDEPENDENT_AMBULATORY_CARE_PROVIDER_SITE_OTHER): Payer: Medicare Other | Admitting: Family Medicine

## 2012-03-10 ENCOUNTER — Encounter: Payer: Self-pay | Admitting: Family Medicine

## 2012-03-10 VITALS — BP 110/74 | HR 84 | Temp 98.5°F | Wt 154.0 lb

## 2012-03-10 DIAGNOSIS — J40 Bronchitis, not specified as acute or chronic: Secondary | ICD-10-CM

## 2012-03-10 MED ORDER — METHYLPREDNISOLONE ACETATE 80 MG/ML IJ SUSP
80.0000 mg | Freq: Once | INTRAMUSCULAR | Status: AC
  Administered 2012-03-10: 80 mg via INTRAMUSCULAR

## 2012-03-10 MED ORDER — HYDROCODONE-HOMATROPINE 5-1.5 MG/5ML PO SYRP: 5.0000 mL | ORAL_SOLUTION | ORAL | Status: AC | PRN

## 2012-03-10 MED ORDER — CEPHALEXIN 500 MG PO CAPS: 500.0000 mg | ORAL_CAPSULE | Freq: Three times a day (TID) | ORAL | Status: AC

## 2012-03-10 NOTE — Progress Notes (Signed)
  Subjective:    Patient ID: Rachael [name], female    DOB: [birth year], 78 y.o.   MRN: [id number]  HPI Here for 2 things. First she needs clearance for a right total knee arthroplasty on 04-19-12 per Dr. Thurston Hole. She had a cardiac workup a few weeks ago with Dr. Antoine Poche including a stress test, and all this turned out to be clear. Now for the past 3 days she has had chest tightness and coughing up clear sputum. No chest pains or fever.    Review of Systems  Constitutional: Negative.   HENT: Negative.   Eyes: Negative.   Respiratory: Positive for cough and chest tightness. Negative for shortness of breath and wheezing.   Cardiovascular: Negative.        Objective:   Physical Exam  Constitutional: She appears well-developed and well-nourished.  Neck: Neck supple. No thyromegaly present.  Cardiovascular: Normal rate, regular rhythm, normal heart sounds and intact distal pulses.   Pulmonary/Chest: Effort normal and breath sounds normal. No respiratory distress. She has no wheezes. She has no rales.  Lymphadenopathy:    She has no cervical adenopathy.          Assessment & Plan:  Treat the bronchitis as above. She should be fine by the time of her surgery. She was cleared for surgery.

## 2012-03-10 NOTE — Addendum Note (Signed)
Addended by: Aniceto Boss A on: 03/10/2012 01:00 PM   Modules accepted: Orders

## 2012-03-15 ENCOUNTER — Encounter: Payer: Self-pay | Admitting: *Deleted

## 2012-03-15 ENCOUNTER — Encounter (INDEPENDENT_AMBULATORY_CARE_PROVIDER_SITE_OTHER): Payer: Self-pay | Admitting: Surgery

## 2012-03-15 ENCOUNTER — Ambulatory Visit (INDEPENDENT_AMBULATORY_CARE_PROVIDER_SITE_OTHER): Payer: Medicare Other | Admitting: Surgery

## 2012-03-15 VITALS — BP 124/88 | HR 76 | Temp 99.1°F | Ht 65.0 in | Wt 151.4 lb

## 2012-03-15 DIAGNOSIS — K219 Gastro-esophageal reflux disease without esophagitis: Secondary | ICD-10-CM | POA: Diagnosis not present

## 2012-03-15 NOTE — Patient Instructions (Addendum)
Thanks for your patience.  If you need further assistance after leaving the office, please call our office and speak with a [facility] nurse.  [phone].  If you want to leave a message for Dr. Donyetta Ogletree, please call his office phone at [phone]. 

## 2012-03-15 NOTE — Progress Notes (Signed)
[name] 78 y.o.  Body mass index is 25.19 kg/(m^2).  Patient Active Problem List  Diagnosis  . HYPOTHYROIDISM  . HYPERLIPIDEMIA  . ANEMIA-NOS  . DEPRESSION  . CONJUNCTIVITIS  . CORONARY ARTERY DISEASE  . ACUTE BRONCHITIS  . ALLERGIC RHINITIS  . GERD  . DIVERTICULITIS OF COLON  . UNSPEC SYMPTOM ASSOC W/FEMALE GENITAL ORGANS  . OSTEOARTHRITIS  . ANKLE PAIN, RIGHT  . LOW BACK PAIN, CHRONIC  . BUNION, LEFT FOOT  . FOOT PAIN  . FATIGUE  . CHEST WALL PAIN, ANTERIOR  . HYPERGLYCEMIA  . HIATAL HERNIA  . CAROTID ARTERY DISEASE  . ANGINA, HX OF    Allergies  Allergen Reactions  . Meperidine Hcl   . Sulfonamide Derivatives     Past Surgical History  Procedure Date  . Hiatal hernia repair 09-21-10    per Dr. Luretha Murphy, lap [employer]   . Hernia repair    Nelwyn Salisbury, MD 1. GERD (gastroesophageal reflux disease)     Two months ago the patient began having recurrent reflux symptoms.  She has heartburn and frank reflux.   She needs an UGI to assess and I will see her back after that to review with her.   Matt B. Daphine Deutscher, MD, [facility] [phone] beeper [phone]  03/15/2012 3:58 PM

## 2012-03-17 ENCOUNTER — Encounter (INDEPENDENT_AMBULATORY_CARE_PROVIDER_SITE_OTHER): Payer: Medicare Other | Admitting: Ophthalmology

## 2012-03-17 DIAGNOSIS — H401490 Capsular glaucoma with pseudoexfoliation of lens, unspecified eye, stage unspecified: Secondary | ICD-10-CM

## 2012-03-17 DIAGNOSIS — H35439 Paving stone degeneration of retina, unspecified eye: Secondary | ICD-10-CM

## 2012-03-17 DIAGNOSIS — H43819 Vitreous degeneration, unspecified eye: Secondary | ICD-10-CM

## 2012-03-17 DIAGNOSIS — H2510 Age-related nuclear cataract, unspecified eye: Secondary | ICD-10-CM

## 2012-03-18 ENCOUNTER — Telehealth: Payer: Self-pay | Admitting: Family Medicine

## 2012-03-18 NOTE — Telephone Encounter (Signed)
Pt called and said that cough meds and cephalexin are not working. Pt req to get a rx for Amoxicillin or diff abx. Pls call in to [facility] on [address] and [address]. Pls call in asap.

## 2012-03-18 NOTE — Telephone Encounter (Signed)
Spoke with pt- informed dr. Clent Ridges out of office until tomorrow - please advise - pt very worried that she wont have meds for the weekend and she doesn't feel any better - I explained that as soon as he addresses request I will call her.

## 2012-03-19 ENCOUNTER — Ambulatory Visit
Admission: RE | Admit: 2012-03-19 | Discharge: 2012-03-19 | Disposition: A | Discharge status: Home / Self Care | Payer: Medicare Other | Source: Ambulatory Visit | Attending: Surgery | Admitting: Surgery

## 2012-03-19 DIAGNOSIS — K219 Gastro-esophageal reflux disease without esophagitis: Secondary | ICD-10-CM | POA: Diagnosis not present

## 2012-03-19 DIAGNOSIS — K449 Diaphragmatic hernia without obstruction or gangrene: Secondary | ICD-10-CM | POA: Diagnosis not present

## 2012-03-19 MED ORDER — AZITHROMYCIN 1 G PO PACK: 1.0000 | PACK | Freq: Once | ORAL | Status: DC

## 2012-03-19 NOTE — Telephone Encounter (Signed)
Call in a Zpack  ?

## 2012-03-19 NOTE — Telephone Encounter (Signed)
rx sent to [facility]

## 2012-03-19 NOTE — Telephone Encounter (Signed)
Pt is aware waiting on MD °

## 2012-03-22 ENCOUNTER — Telehealth: Payer: Self-pay | Admitting: Family Medicine

## 2012-03-22 MED ORDER — AMOXICILLIN 875 MG PO TABS: 875.0000 mg | ORAL_TABLET | Freq: Two times a day (BID) | ORAL | Status: DC

## 2012-03-22 NOTE — Telephone Encounter (Addendum)
Pt called and said that she took the 1 packet  azithromycin (ZITHROMAX) 1 G powder last night, but pt still feels really bad and has cough. Pt doesn't feel that this is enough abx to get her well. Pt req to get Amoxicillin. Pt said do not leave a vm when you call.  [facility] on [address].

## 2012-03-22 NOTE — Telephone Encounter (Signed)
I sent script e-scribe and spoke with pt. 

## 2012-03-22 NOTE — Telephone Encounter (Signed)
Call in Amoxicillin 875 bid for 10 days  

## 2012-03-31 ENCOUNTER — Ambulatory Visit (INDEPENDENT_AMBULATORY_CARE_PROVIDER_SITE_OTHER): Payer: Medicare Other | Admitting: Surgery

## 2012-03-31 ENCOUNTER — Encounter (INDEPENDENT_AMBULATORY_CARE_PROVIDER_SITE_OTHER): Payer: Self-pay | Admitting: Surgery

## 2012-03-31 VITALS — BP 122/74 | HR 76 | Temp 98.3°F | Resp 16 | Ht 65.5 in | Wt 148.0 lb

## 2012-03-31 DIAGNOSIS — K219 Gastro-esophageal reflux disease without esophagitis: Secondary | ICD-10-CM | POA: Diagnosis not present

## 2012-03-31 MED ORDER — PANTOPRAZOLE SODIUM 20 MG PO TBEC: 20.0000 mg | DELAYED_RELEASE_TABLET | Freq: Every day | ORAL | Status: DC

## 2012-03-31 NOTE — Patient Instructions (Signed)
Diet for Gastroesophageal Reflux Disease, Adult  Reflux (acid reflux) is when acid from your stomach flows up into the esophagus. When acid comes in contact with the esophagus, the acid causes irritation and soreness (inflammation) in the esophagus. When reflux happens often or so severely that it causes damage to the esophagus, it is called gastroesophageal reflux disease (GERD). Nutrition therapy can help ease the discomfort of GERD.  FOODS OR DRINKS TO AVOID OR LIMIT   Smoking or chewing tobacco. Nicotine is one of the most potent stimulants to acid production in the gastrointestinal tract.   Caffeinated and decaffeinated coffee and black tea.   Regular or low-calorie carbonated beverages or energy drinks (caffeine-free carbonated beverages are allowed).    Strong spices, such as black pepper, white pepper, red pepper, cayenne, curry powder, and chili powder.   Peppermint or spearmint.   Chocolate.   High-fat foods, including meats and fried foods. Extra added fats including oils, butter, salad dressings, and nuts. Limit these to less than 8 tsp per day.   Fruits and vegetables if they are not tolerated, such as citrus fruits or tomatoes.   Alcohol.   Any food that seems to aggravate your condition.  If you have questions regarding your diet, call your caregiver or a registered dietitian.  OTHER THINGS THAT MAY HELP GERD INCLUDE:    Eating your meals slowly, in a relaxed setting.   Eating 5 to 6 small meals per day instead of 3 large meals.   Eliminating food for a period of time if it causes distress.   Not lying down until 3 hours after eating a meal.   Keeping the head of your bed raised 6 to 9 inches (15 to 23 cm) by using a foam wedge or blocks under the legs of the bed. Lying flat may make symptoms worse.   Being physically active. Weight loss may be helpful in reducing reflux in overweight or obese adults.   Wear loose fitting clothing  EXAMPLE MEAL PLAN  This meal plan is approximately  2,000 calories based on ChooseMyPlate.gov meal planning guidelines.  Breakfast    cup cooked oatmeal.   1 cup strawberries.   1 cup low-fat milk.   1 oz almonds.  Snack   1 cup cucumber slices.   6 oz yogurt (made from low-fat or fat-free milk).  Lunch   2 slice whole-wheat bread.   2 oz sliced turkey.   2 tsp mayonnaise.   1 cup blueberries.   1 cup snap peas.  Snack   6 whole-wheat crackers.   1 oz string cheese.  Dinner    cup brown rice.   1 cup mixed veggies.   1 tsp olive oil.   3 oz grilled fish.  Document Released: 05/12/2005 Document Revised: 08/04/2011 Document Reviewed: 03/28/2011  [employer] Patient Information 2013 [employer].

## 2012-03-31 NOTE — Progress Notes (Signed)
DATE OF PROCEDURE: 09/20/2010  DATE OF DISCHARGE:  OPERATIVE REPORT  PREOPERATIVE DIAGNOSIS: Large type 3 mixed hiatal hernia.  PROCEDURE: Laparoscopic takedown and repair of large type 3 mixed  hiatal hernia, closure of the diaphragm with pledgeted sutures of 2-0  Surgidac, four posteriorly and one anteriorly, Nissen fundoplication  over a #50 bougie with three sutures, upper endoscopy by Dr. Ezzard Standing,  application of [facility] biologic diaphragmatic patch sutured with five  sutures anchoring it.  SURGEON: [place]. Daphine Deutscher, M.D.  ASSISTANT: Sandria Bales. Ezzard Standing, M.D.  Upper GI report 03/19/12 IMPRESSION:  1. Small hiatal hernia.  2. Changes of prior gastric fundoplication.  3. Mild to moderate gastroesophageal reflux.  4. Tertiary contractions in the distal esophagus.   UGI reviewed. She has a small HH above her wrap.  This occurred in [place] after she vomited.  Hadassah Pais is doing a total knee on her later this month.  I will place her on Protonix and see her back in January.  I drew her pics of her current anatomy and will discuss surgical intervention in January if necessary.  Hopefully, the protonix will control her symptoms.  Return January

## 2012-04-06 ENCOUNTER — Encounter (HOSPITAL_COMMUNITY): Payer: Self-pay

## 2012-04-07 ENCOUNTER — Encounter: Payer: Self-pay | Admitting: Cardiology

## 2012-04-07 ENCOUNTER — Ambulatory Visit (INDEPENDENT_AMBULATORY_CARE_PROVIDER_SITE_OTHER): Payer: Medicare Other | Admitting: Cardiology

## 2012-04-07 ENCOUNTER — Other Ambulatory Visit (HOSPITAL_COMMUNITY): Payer: Medicare Other

## 2012-04-07 VITALS — BP 134/80 | HR 74 | Ht 65.0 in | Wt 148.0 lb

## 2012-04-07 DIAGNOSIS — I6529 Occlusion and stenosis of unspecified carotid artery: Secondary | ICD-10-CM | POA: Diagnosis not present

## 2012-04-07 DIAGNOSIS — I471 Supraventricular tachycardia, unspecified: Secondary | ICD-10-CM

## 2012-04-07 DIAGNOSIS — M25569 Pain in unspecified knee: Secondary | ICD-10-CM | POA: Diagnosis not present

## 2012-04-07 DIAGNOSIS — I499 Cardiac arrhythmia, unspecified: Secondary | ICD-10-CM | POA: Diagnosis not present

## 2012-04-07 DIAGNOSIS — R42 Dizziness and giddiness: Secondary | ICD-10-CM | POA: Diagnosis not present

## 2012-04-07 DIAGNOSIS — R000 Tachycardia, unspecified: Secondary | ICD-10-CM | POA: Diagnosis not present

## 2012-04-07 HISTORY — DX: Supraventricular tachycardia, unspecified: I47.10

## 2012-04-07 HISTORY — DX: Supraventricular tachycardia: I47.1

## 2012-04-07 NOTE — Patient Instructions (Addendum)
The current medical regimen is effective;  continue present plan and medications.  Your physician has recommended that you wear an event monitor for 21 days. Event monitors are medical devices that record the heart's electrical activity. Doctors most often [place] these monitors to diagnose arrhythmias. Arrhythmias are problems with the speed or rhythm of the heartbeat. The monitor is a small, portable device. You can wear one while you do your normal daily activities. This is usually used to diagnose what is causing palpitations/syncope (passing out).  Your physician has requested that you have a carotid duplex the same day your event monitor is placed. This test is an ultrasound of the carotid arteries in your neck. It looks at blood flow through these arteries that supply the brain with blood. Allow one hour for this exam. There are no restrictions or special instructions.  Follow up after the monitor and carotid doppler are completed with a PA or NP.

## 2012-04-07 NOTE — Progress Notes (Signed)
HPI The patient presents for followup of SVT.  She was at the orthopedic office today and was reported to have a heart rate of 190. No rhythm strips are available or EKGs. By the time EMS arrived she had apparently converted to regular rhythm. She was added to the schedule. I had last seen her preoperatively prior to possible knee surgery. She had a stress perfusion study which demonstrated an EF of 72% with no evidence of ischemia or infarct.   She says that she's actually been having this for quite a while. However, she thinks it's increased frequency and intensity. She says it happens sporadically. However, typically lasts only about 5 minutes. She breathes quickly and it will abate.  She gets a little lightheaded but she's not had any frank syncope. She doesn't get any chest discomfort with it, neck or arm discomfort. She doesn't have any new shortness of breath, PND or orthopnea. She has had some atypical chest discomfort as described previously. When she was at the doctor's office name was called and she stood up. She felt anxious. That's when her symptoms started.  Allergies  Allergen Reactions  . Meperidine Hcl Other (See Comments)    Unknown   . Sulfonamide Derivatives Other (See Comments)    Happened about 15 years ago    Current Outpatient Prescriptions  Medication Sig Dispense Refill  . allopurinol (ZYLOPRIM) 100 MG tablet Take 1 tablet (100 mg total) by mouth daily.  30 tablet  11  . Hydrocodone-Acetaminophen (VICODIN HP) 10-660 MG TABS Take 1 tablet by mouth every 6 (six) hours as needed.  120 each  5  . ibuprofen (ADVIL,MOTRIN) 800 MG tablet Take 1 tablet (800 mg total) by mouth every 8 (eight) hours as needed for pain.  90 tablet  5  . levothyroxine (SYNTHROID, LEVOTHROID) 100 MCG tablet TAKE ONE TABLET BY MOUTH EVERY DAY  30 tablet  11  . nitroGLYCERIN (NITROSTAT) 0.3 MG SL tablet Place 1 tablet (0.3 mg total) under the tongue every 5 (five) minutes as needed for chest pain.   50 tablet  11  . pantoprazole (PROTONIX) 20 MG tablet Take 1 tablet (20 mg total) by mouth daily.  60 tablet  3    Past Medical History  Diagnosis Date  . GERD (gastroesophageal reflux disease)   . Gout     has seen Dr. Alben Deeds  . CAD (coronary artery disease)     sees Dr. Antoine Poche, normal Myoview stress test 02-26-12   . Hypothyroidism   . Hyperglycemia   . Depression   . Anemia   . Diverticulitis of colon   . Arthritis     Past Surgical History  Procedure Date  . Hiatal hernia repair 09-21-10    per Dr. Luretha Murphy, lap [employer]   . Hernia repair     ROS:  As stated in the HPI and negative for all other systems.  PHYSICAL EXAM BP 134/80  Pulse 74  Ht 5\' 5"  (1.651 m)  Wt 148 lb (67.132 kg)  BMI 24.63 kg/m2 GENERAL:  Well appearing HEENT:  Pupils equal round and reactive, fundi not visualized, oral mucosa unremarkable NECK:  No jugular venous distention, waveform within normal limits, carotid upstroke brisk and symmetric, no bruits, no thyromegaly LYMPHATICS:  No cervical, inguinal adenopathy LUNGS:  Clear to auscultation bilaterally BACK:  No CVA tenderness CHEST:  Unremarkable HEART:  PMI not displaced or sustained,S1 and S2 within normal limits, no S3, no S4, no clicks, no rubs, no  murmurs ABD:  Flat, positive bowel sounds normal in frequency in pitch, no bruits, no rebound, no guarding, no midline pulsatile mass, no hepatomegaly, no splenomegaly EXT:  2 plus pulses throughout, no edema, no cyanosis no clubbing   EKG:  Sinus rhythm, rate 94, axis within normal limits, intervals within normal limits, no acute. 04/07/2012   ASSESSMENT AND PLAN  TACHYCARDIA The patient will have a 21 day event monitor. We reviewed vagel maneuvers that she could use. If she has further symptoms between now and then that need treatment I might empirically start a beta blocker.  CORONARY ARTERY DISEASE She had a negative stress perfusion study. No further cardiovascular  testing is suggested.   CAROTID ARTERY DISEASE  The patient has mild nonobstructive carotid stenosis.  She is due to have follow up carotid Dopplers when she comes back for her event monitor

## 2012-04-09 ENCOUNTER — Encounter (HOSPITAL_COMMUNITY)
Admission: RE | Admit: 2012-04-09 | Discharge: 2012-04-09 | Payer: Medicare Other | Source: Ambulatory Visit | Attending: Orthopedic Surgery | Admitting: Orthopedic Surgery

## 2012-04-09 NOTE — Pre-Procedure Instructions (Signed)
[address] BRISTOL OSENTOSKI  04/09/2012   Your procedure is scheduled on:  Monday, November 25th.  Report to [facility] at 6:30AM.  Call this number if you have problems the morning of surgery: [phone]   Remember: Nothing to eat or drink after Midnight.    Take these medicines the morning of surgery with A SIP OF WATER:  Allopurinol (Zyloprim), Levothyroxine (Synthyroid) and Pantoprazole (Protonix).  May take Hydrocodone-Acetaminophen (Vicodin) if needed.                 Stop taking Advil Monday, Nov18th.   Do not wear jewelry, make-up or nail polish.  Do not wear lotions, powders, or perfumes. You may wear deodorant.  Do not shave 48 hours prior to surgery. Men may shave face and neck.  Do not bring valuables to the hospital.  Contacts, dentures or bridgework may not be worn into surgery.  Leave suitcase in the car. After surgery it may be brought to your room.  For patients admitted to the hospital, checkout time is 11:00 AM the day of discharge.   Patients discharged the day of surgery will not be allowed to drive home.  Name and phone number of your driver: NA  Special Instructions: Shower using CHG 2 nights before surgery and the night before surgery.  If you shower the day of surgery use CHG.  Use special wash - you have one bottle of CHG for all showers.  You should use approximately 1/3 of the bottle for each shower.   Please read over the following fact sheets that you were given: Pain Booklet, Coughing and Deep Breathing, Blood Transfusion Information and Surgical Site Infection Prevention

## 2012-04-12 ENCOUNTER — Encounter (INDEPENDENT_AMBULATORY_CARE_PROVIDER_SITE_OTHER): Payer: Medicare Other

## 2012-04-12 ENCOUNTER — Telehealth: Payer: Self-pay | Admitting: *Deleted

## 2012-04-12 DIAGNOSIS — R42 Dizziness and giddiness: Secondary | ICD-10-CM

## 2012-04-12 DIAGNOSIS — I6529 Occlusion and stenosis of unspecified carotid artery: Secondary | ICD-10-CM | POA: Diagnosis not present

## 2012-04-12 NOTE — Telephone Encounter (Signed)
Patient is in for a carotic Duplex and heart monitor today. Pt C/O to the front desk registrar that she has pain on left upper chest near  the base of her neck on  left side, same pain as when she was seen by Dr. Antoine Poche on 04/07/12. Pt is aware that Dr. Antoine Poche order these tests because of her symptoms. Pt verbalized understanding.

## 2012-04-16 ENCOUNTER — Encounter: Payer: Self-pay | Admitting: *Deleted

## 2012-04-19 ENCOUNTER — Inpatient Hospital Stay (HOSPITAL_COMMUNITY): Admission: RE | Admit: 2012-04-19 | Payer: Medicare Other | Source: Ambulatory Visit | Admitting: Orthopedic Surgery

## 2012-04-19 ENCOUNTER — Encounter (HOSPITAL_COMMUNITY): Admission: RE | Payer: Self-pay | Source: Ambulatory Visit

## 2012-04-19 SURGERY — ARTHROPLASTY, KNEE, TOTAL
Anesthesia: General | Laterality: Right

## 2012-05-28 ENCOUNTER — Ambulatory Visit: Payer: Medicare Other | Admitting: Nurse Practitioner

## 2012-06-01 ENCOUNTER — Ambulatory Visit: Payer: Medicare Other | Admitting: Nurse Practitioner

## 2012-06-02 ENCOUNTER — Ambulatory Visit (INDEPENDENT_AMBULATORY_CARE_PROVIDER_SITE_OTHER): Payer: Medicare Other | Admitting: Nurse Practitioner

## 2012-06-02 ENCOUNTER — Encounter: Payer: Self-pay | Admitting: Nurse Practitioner

## 2012-06-02 ENCOUNTER — Other Ambulatory Visit: Payer: Self-pay | Admitting: *Deleted

## 2012-06-02 VITALS — BP 126/84 | HR 72 | Ht 65.0 in | Wt 156.8 lb

## 2012-06-02 DIAGNOSIS — I471 Supraventricular tachycardia, unspecified: Secondary | ICD-10-CM

## 2012-06-02 NOTE — Progress Notes (Signed)
[name] Date of Birth: 10/15/1930 Medical Record #[id number]  History of Present Illness: Ms. Rachael [name] is seen today for a follow up visit. She is seen for Dr. Antoine Poche. She has a history of CAD reported with negative Myoview this past October. Other issues as listed. She was planning on having TKR per Dr. Thurston Hole and had been cleared per her report. She then had a spell of tachycardia (documentation just shows sinus rhythm noted) and her surgery was postponed. She was referred for a 21 day event monitor and carotid dopplers.  She comes in today. She is doing ok. She says she has a localized pain in her left chest that she can feel when she presses on it. No exertional symptoms whatsoever. Not short of breath. Very limited by her knee. She says she will still have some fast heart beating that is associated with eating. She says she has been told in the past that her thyroid medicine needs adjustment but she is not sure if that has happened. She says she is followed by Dr. Clent Ridges. Last TSH I see was ok.   Her carotids showed 0 - 39% bilaterally. Her event monitor looks ok to me. Maximum heart rate just 111. No significant arrhythmia noted.   Current Outpatient Prescriptions on File Prior to Visit  Medication Sig Dispense Refill  . allopurinol (ZYLOPRIM) 100 MG tablet Take 1 tablet (100 mg total) by mouth daily.  30 tablet  11  . Hydrocodone-Acetaminophen (VICODIN HP) 10-660 MG TABS Take 1 tablet by mouth every 6 (six) hours as needed.  120 each  5  . ibuprofen (ADVIL,MOTRIN) 800 MG tablet Take 1 tablet (800 mg total) by mouth every 8 (eight) hours as needed for pain.  90 tablet  5  . levothyroxine (SYNTHROID, LEVOTHROID) 100 MCG tablet TAKE ONE TABLET BY MOUTH EVERY DAY  30 tablet  11  . nitroGLYCERIN (NITROSTAT) 0.3 MG SL tablet Place 1 tablet (0.3 mg total) under the tongue every 5 (five) minutes as needed for chest pain.  50 tablet  11  . pantoprazole (PROTONIX) 20 MG tablet Take 1 tablet  (20 mg total) by mouth daily.  60 tablet  3    Allergies  Allergen Reactions  . Meperidine Hcl Other (See Comments)    Unknown   . Sulfonamide Derivatives Other (See Comments)    Happened about 15 years ago    Past Medical History  Diagnosis Date  . GERD (gastroesophageal reflux disease)   . Gout     has seen Dr. Alben Deeds  . CAD (coronary artery disease)     sees Dr. Antoine Poche, normal Myoview stress test 02-26-12   . Hypothyroidism   . Hyperglycemia   . Depression   . Anemia   . Diverticulitis of colon   . Arthritis     Past Surgical History  Procedure Date  . Hiatal hernia repair 09-21-10    per Dr. Luretha Murphy, lap [employer]   . Hernia repair     History  Smoking status  . Never Smoker   Smokeless tobacco  . Never Used    History  Alcohol Use No    No family history on file.  Review of Systems: The review of systems is per the HPI.  All other systems were reviewed and are negative.  Physical Exam: BP 126/84  Pulse 72  Ht 5\' 5"  (1.651 m)  Wt 156 lb 12.8 oz (71.124 kg)  BMI 26.09 kg/m2 Patient is very pleasant and  in no acute distress. She is a poor historian. Skin is warm and dry. Color is normal.  HEENT is unremarkable. Normocephalic/atraumatic. PERRL. Sclera are nonicteric. Neck is supple. No masses. No JVD. Lungs are clear. Cardiac exam shows a regular rate and rhythm. Abdomen is soft. Extremities are without edema. Gait and ROM are intact but her right knee has gross deformity noted. No gross neurologic deficits noted.   LABORATORY DATA:  Lab Results  Component Value Date   WBC 10.4 06/19/2011   HGB 14.8 06/19/2011   HCT 45.3 06/19/2011   PLT 345.0 06/19/2011   GLUCOSE 77 06/19/2011   CHOL 215* 06/19/2011   TRIG 190.0* 06/19/2011   HDL 49.70 06/19/2011   LDLDIRECT 136.4 06/19/2011   LDLCALC 114* 08/08/2010   ALT 8 06/19/2011   AST 19 06/19/2011   NA 142 06/19/2011   K 4.5 06/19/2011   CL 109 06/19/2011   CREATININE 0.9 06/19/2011   BUN 13 06/19/2011    CO2 25 06/19/2011   TSH 0.44 06/19/2011   HGBA1C 6.2 08/23/2009     Assessment / Plan:  1. Tachycardia - no significant arrhythmia noted by the event monitor. I see no indication to change her medicines.   2. Carotid disease - 0 to 39% noted bilaterally  3. Chest pain - normal Myoview back in October. What she describes today is musculoskeletal in origin.   4. OA of the right knee - I think she is an acceptable candidate for knee surgery.   I will have her see Dr. Antoine Poche in about 6 months. She is going to get in touch with Dr. Thurston Hole about her knee surgery. She is going to talk with Dr. Clent Ridges about her thyroid medicine.   Patient is agreeable to this plan and will call if any problems develop in the interim.

## 2012-06-02 NOTE — Patient Instructions (Addendum)
Follow up with Dr. Clent Ridges about your labs and your dose of thyroid medicine  See Dr. Antoine Poche in about 6 months  Ok to reschedule your knee surgery with Dr. Thurston Hole.  Call the [facility] office at [phone] if you have any questions, problems or concerns.

## 2012-06-03 ENCOUNTER — Other Ambulatory Visit: Payer: Self-pay | Admitting: Physician Assistant

## 2012-06-03 ENCOUNTER — Telehealth: Payer: Self-pay | Admitting: Family Medicine

## 2012-06-03 ENCOUNTER — Ambulatory Visit (HOSPITAL_COMMUNITY)
Admission: RE | Admit: 2012-06-03 | Discharge: 2012-06-03 | Disposition: A | Discharge status: Home / Self Care | Payer: Medicare Other | Source: Ambulatory Visit | Attending: Physician Assistant | Admitting: Physician Assistant

## 2012-06-03 ENCOUNTER — Encounter (HOSPITAL_COMMUNITY): Payer: Self-pay | Admitting: Pharmacy Technician

## 2012-06-03 ENCOUNTER — Encounter: Payer: Self-pay | Admitting: Physician Assistant

## 2012-06-03 ENCOUNTER — Telehealth (INDEPENDENT_AMBULATORY_CARE_PROVIDER_SITE_OTHER): Payer: Self-pay

## 2012-06-03 ENCOUNTER — Encounter (HOSPITAL_COMMUNITY): Payer: Self-pay

## 2012-06-03 ENCOUNTER — Encounter (HOSPITAL_COMMUNITY)
Admission: RE | Admit: 2012-06-03 | Discharge: 2012-06-03 | Disposition: A | Discharge status: Home / Self Care | Payer: Medicare Other | Source: Ambulatory Visit | Attending: Orthopedic Surgery | Admitting: Orthopedic Surgery

## 2012-06-03 DIAGNOSIS — K449 Diaphragmatic hernia without obstruction or gangrene: Secondary | ICD-10-CM | POA: Insufficient documentation

## 2012-06-03 DIAGNOSIS — E039 Hypothyroidism, unspecified: Secondary | ICD-10-CM | POA: Diagnosis not present

## 2012-06-03 DIAGNOSIS — Z01812 Encounter for preprocedural laboratory examination: Secondary | ICD-10-CM | POA: Insufficient documentation

## 2012-06-03 DIAGNOSIS — I2510 Atherosclerotic heart disease of native coronary artery without angina pectoris: Secondary | ICD-10-CM | POA: Insufficient documentation

## 2012-06-03 DIAGNOSIS — M1710 Unilateral primary osteoarthritis, unspecified knee: Secondary | ICD-10-CM | POA: Insufficient documentation

## 2012-06-03 DIAGNOSIS — M1990 Unspecified osteoarthritis, unspecified site: Secondary | ICD-10-CM | POA: Insufficient documentation

## 2012-06-03 DIAGNOSIS — R739 Hyperglycemia, unspecified: Secondary | ICD-10-CM

## 2012-06-03 DIAGNOSIS — K219 Gastro-esophageal reflux disease without esophagitis: Secondary | ICD-10-CM | POA: Insufficient documentation

## 2012-06-03 DIAGNOSIS — Z01818 Encounter for other preprocedural examination: Secondary | ICD-10-CM | POA: Diagnosis not present

## 2012-06-03 DIAGNOSIS — F32A Depression, unspecified: Secondary | ICD-10-CM

## 2012-06-03 DIAGNOSIS — M25569 Pain in unspecified knee: Secondary | ICD-10-CM | POA: Diagnosis not present

## 2012-06-03 DIAGNOSIS — F329 Major depressive disorder, single episode, unspecified: Secondary | ICD-10-CM

## 2012-06-03 DIAGNOSIS — M1711 Unilateral primary osteoarthritis, right knee: Secondary | ICD-10-CM

## 2012-06-03 DIAGNOSIS — D649 Anemia, unspecified: Secondary | ICD-10-CM

## 2012-06-03 DIAGNOSIS — K5732 Diverticulitis of large intestine without perforation or abscess without bleeding: Secondary | ICD-10-CM

## 2012-06-03 DIAGNOSIS — M109 Gout, unspecified: Secondary | ICD-10-CM

## 2012-06-03 HISTORY — DX: Anxiety disorder, unspecified: F41.9

## 2012-06-03 HISTORY — DX: Dizziness and giddiness: R42

## 2012-06-03 HISTORY — DX: Personal history of other diseases of the digestive system: Z87.19

## 2012-06-03 HISTORY — DX: Shortness of breath: R06.02

## 2012-06-03 LAB — COMPREHENSIVE METABOLIC PANEL
AST: 22 U/L (ref 0–37)
Albumin: 4.1 g/dL (ref 3.5–5.2)
BUN: 13 mg/dL (ref 6–23)
Calcium: 9.6 mg/dL (ref 8.4–10.5)
Creatinine, Ser: 0.81 mg/dL (ref 0.50–1.10)
Total Protein: 7.8 g/dL (ref 6.0–8.3)

## 2012-06-03 LAB — CBC WITH DIFFERENTIAL/PLATELET
Basophils Absolute: 0.1 10*3/uL (ref 0.0–0.1)
Basophils Relative: 1 % (ref 0–1)
Eosinophils Absolute: 0.3 10*3/uL (ref 0.0–0.7)
Eosinophils Relative: 3 % (ref 0–5)
HCT: 47.4 % — ABNORMAL HIGH (ref 36.0–46.0)
MCH: 28.6 pg (ref 26.0–34.0)
MCHC: 32.3 g/dL (ref 30.0–36.0)
MCV: 88.6 fL (ref 78.0–100.0)
Monocytes Absolute: 0.6 10*3/uL (ref 0.1–1.0)
Neutro Abs: 4.8 10*3/uL (ref 1.7–7.7)
RDW: 14 % (ref 11.5–15.5)

## 2012-06-03 LAB — URINALYSIS, ROUTINE W REFLEX MICROSCOPIC
Bilirubin Urine: NEGATIVE
Glucose, UA: NEGATIVE mg/dL
Ketones, ur: NEGATIVE mg/dL
Leukocytes, UA: NEGATIVE
Nitrite: NEGATIVE
Protein, ur: NEGATIVE mg/dL

## 2012-06-03 LAB — PROTIME-INR
INR: 1.08 (ref 0.00–1.49)
Prothrombin Time: 13.9 seconds (ref 11.6–15.2)

## 2012-06-03 LAB — TYPE AND SCREEN: ABO/RH(D): A POS

## 2012-06-03 LAB — APTT: aPTT: 31 seconds (ref 24–37)

## 2012-06-03 LAB — ABO/RH: ABO/RH(D): A POS

## 2012-06-03 NOTE — Telephone Encounter (Signed)
Dr. Sherene Sires PA called stating that patient is coming in our office tomorrow a.m. At 9:30am and the Md would like to have Thyroid panel for the total knee on Monday to make sure she is ok. Surgery was cancelled in November because patient came in NSVT. Patient has been cleared by cardiology no cause found. They want to make sure she is not hyperthyroid prior to Anethesia on Monday. Call results to Dr. Thurston Hole [phone] please run STAT because surgery is Monday.

## 2012-06-03 NOTE — Pre-Procedure Instructions (Signed)
Rachael [name]  06/03/2012   Your procedure is scheduled on:  Monday, January 13th  Report to [facility] at 1000 AM.  Call this number if you have problems the morning of surgery: [phone]   Remember:   Do not eat food or drink liquids after midnight.    Take these medicines the morning of surgery with A SIP OF WATER: allopurinol, synthroid, protonix, vicodin if needed   Do not wear jewelry, make-up or nail polish.  Do not wear lotions, powders, or perfumes. You may not wear deodorant.  Do not shave 48 hours prior to surgery.   Do not bring valuables to the hospital.  Contacts, dentures or bridgework may not be worn into surgery.  Leave suitcase in the car. After surgery it may be brought to your room.  For patients admitted to the hospital, checkout time is 11:00 AM the day of discharge.   Patients discharged the day of surgery will not be allowed to drive home.   Special Instructions: Shower using CHG 2 nights before surgery and the night before surgery.  If you shower the day of surgery use CHG.  Use special wash - you have one bottle of CHG for all showers.  You should use approximately 1/3 of the bottle for each shower.   Please read over the following fact sheets that you were given: Pain Booklet, Coughing and Deep Breathing, Blood Transfusion Information, MRSA Information and Surgical Site Infection Prevention

## 2012-06-03 NOTE — Telephone Encounter (Signed)
Rachael [name] called stating the pt is going to get scheduled for surgery for her knee replacement on Monday.  She thought the pt was having her hernia repaired by Dr Daphine Deutscher January 23rd and wants to know if it's urgent or not.  I looked at her chart and she is only scheduled to see him on 1/30 to discuss surgery.  They will proceed with scheduling the knee replacement.

## 2012-06-03 NOTE — H&P (Signed)
TOTAL KNEE ADMISSION H&P  Patient is being admitted for right total knee arthroplasty.  Subjective:  Chief Complaint:right knee pain.  HPI: Rachael [name], 79 y.o. female, has a history of pain and functional disability in the right knee due to arthritis and has failed non-surgical conservative treatments for greater than 12 weeks to includeNSAID's and/or analgesics, corticosteriod injections, flexibility and strengthening excercises, supervised PT with diminished ADL's post treatment, use of assistive devices and activity modification.  Onset of symptoms was gradual, starting 8 years ago with gradually worsening course since that time. The patient noted no past surgery on the right knee(s).  Patient currently rates pain in the right knee(s) at 8 out of 10 with activity. Patient has night pain, worsening of pain with activity and weight bearing, pain that interferes with activities of daily living, crepitus and joint swelling.  Patient has evidence of subchondral sclerosis, joint subluxation and joint space narrowing by imaging studies.  There is no active infection.  Patient Active Problem List   Diagnosis Date Noted  . CAD (coronary artery disease)   . Hypothyroidism   . Hyperglycemia   . Depression   . Anemia   . Diverticulitis of colon   . Arthritis   . Gout   . Right knee DJD   . Supraventricular tachycardia 04/07/2012  . CAROTID ARTERY DISEASE 07/31/2010  . ANGINA, HX OF 07/30/2010  . HIATAL HERNIA 06/17/2010  . ANKLE PAIN, RIGHT 05/01/2010  . CONJUNCTIVITIS 02/08/2010  . BUNION, LEFT FOOT 08/23/2009  . DIVERTICULITIS OF COLON 08/21/2009  . UNSPEC SYMPTOM ASSOC W/FEMALE GENITAL ORGANS 12/05/2008  . ALLERGIC RHINITIS 08/15/2008  . CHEST WALL PAIN, ANTERIOR 08/15/2008  . HYPERGLYCEMIA 06/21/2008  . ACUTE BRONCHITIS 01/07/2008  . OSTEOARTHRITIS 11/25/2007  . FOOT PAIN 11/25/2007  . HYPOTHYROIDISM 04/27/2007  . HYPERLIPIDEMIA 04/27/2007  . ANEMIA-NOS 04/27/2007  .  DEPRESSION 04/27/2007  . CORONARY ARTERY DISEASE 04/27/2007  . GERD 04/27/2007  . LOW BACK PAIN, CHRONIC 04/27/2007  . FATIGUE 04/27/2007   Past Medical History  Diagnosis Date  . GERD (gastroesophageal reflux disease)   . Gout     has seen Dr. James Beekman  . CAD (coronary artery disease)     sees Dr. Hochrein, normal Myoview stress test 02-26-12   . Hypothyroidism   . Hyperglycemia   . Depression   . Anemia   . Diverticulitis of colon   . Arthritis   . Supraventricular tachycardia 04/07/12    In Dr [facility]'s office  . Right knee DJD     Past Surgical History  Procedure Date  . Hiatal hernia repair 09-21-10    per Dr. Matthew Martin, lap [employer]   . Hernia repair      (Not in a hospital admission) Allergies  Allergen Reactions  . Meperidine Hcl Other (See Comments)    Unknown   . Sulfonamide Derivatives Other (See Comments)    Happened about 15 years ago    History  Substance Use Topics  . Smoking status: Never Smoker   . Smokeless tobacco: Never Used  . Alcohol Use: No    Family History  Problem Relation Age of Onset  . Diabetes Mellitus II    . Heart disease       Review of Systems  Constitutional: Negative.   HENT: Negative.   Eyes: Negative.   Respiratory: Negative.   Cardiovascular: Negative.   Gastrointestinal: Negative.   Genitourinary: Negative.   Musculoskeletal:       Right knee pain and valus    Skin: Negative.   Neurological: Negative.   Endo/Heme/Allergies: Negative.   Psychiatric/Behavioral: Negative.     Objective:  Physical Exam  Constitutional: She is oriented to person, place, and time. She appears well-developed and well-nourished.  HENT:  Head: Normocephalic and atraumatic.  Mouth/Throat: Oropharynx is clear and moist.  Eyes: EOM are normal. Pupils are equal, round, and reactive to light.  Neck: Neck supple.  Cardiovascular: Normal rate and regular rhythm.   Respiratory: Effort normal.  GI: Soft.  Musculoskeletal:        Examination of her right knee reveals significant valgus deformity, 1+ synovitis diffuse pain, range of motion -5 to 120 degrees knee is stable with normal patella tracking. Exam of the left knee reveals moderate valgus deformity with diffuse pain, range of motion from -5 to 125 degrees knee is stable with normal patella tracking. Vascular exam: pulses 2+ and symmetric.  Neurological: She is alert and oriented to person, place, and time.  Skin: Skin is warm and dry.  Psychiatric: She has a normal mood and affect. Her behavior is normal. Judgment and thought content normal.    Vital signs in last 24 hours: Last recorded: 01/09 1100   BP: 138/81 Pulse: 78  Temp: 97.7 F (36.5 C)    Height: 5' 5" (1.651 m) SpO2: 95  Weight: 70.761 kg (156 lb)     Labs:   Estimated Body mass index is 25.96 kg/(m^2) as calculated from the following:   Height as of this encounter: 5' 5"(1.651 m).   Weight as of this encounter: 156 lb(70.761 kg).   Imaging Review Plain radiographs demonstrate severe degenerative joint disease of the right knee(s). The overall alignment issignificant valgus. The bone quality appears to be adequate for age and reported activity level.  Assessment/Plan:  End stage arthritis, right knee  Patient Active Problem List  Diagnosis  . HYPOTHYROIDISM  . HYPERLIPIDEMIA  . ANEMIA-NOS  . DEPRESSION  . CONJUNCTIVITIS  . CORONARY ARTERY DISEASE  . ACUTE BRONCHITIS  . ALLERGIC RHINITIS  . GERD  . DIVERTICULITIS OF COLON  . UNSPEC SYMPTOM ASSOC W/FEMALE GENITAL ORGANS  . OSTEOARTHRITIS  . ANKLE PAIN, RIGHT  . LOW BACK PAIN, CHRONIC  . BUNION, LEFT FOOT  . FOOT PAIN  . FATIGUE  . CHEST WALL PAIN, ANTERIOR  . HYPERGLYCEMIA  . HIATAL HERNIA  . CAROTID ARTERY DISEASE  . ANGINA, HX OF  . Supraventricular tachycardia  . CAD (coronary artery disease)  . Hypothyroidism  . Hyperglycemia  . Depression  . Anemia  . Diverticulitis of colon  . Arthritis  . Gout  . Right  knee DJD    On 04-07-12 this 79 year-old female came in to discuss knee pain and to discuss a total knee replacement.   Height: 5?5.  Weight: 148 pounds.  Respirations: 20.  Temperature: 98.4.  Blood pressure: 123/83.  Pulse: 191.  She was evaluated by Dr. Becky Bassett, who felt she was in SVT.  I spoke to Dr. Hochrein's nurse who referred us to the emergency room.  EMS was called.  By the time EMS arrived her pulse was normal sinus rhythm and was back down to 91.  I once again spoke to Dr. Hochrein's nurse, she agreed to work Rachael [name] in to their schedule today.  Rachael [name] has been setup for Holter monitoring for the next 21 days.  She underwent the holter monitoring and has since been cleared for a total knee by cardiology   The patient history, physical examination,   clinical judgment of the provider and imaging studies are consistent with end stage degenerative joint disease of the right knee(s) and total knee arthroplasty is deemed medically necessary. The treatment options including medical management, injection therapy arthroscopy and arthroplasty were discussed at length. The risks and benefits of total knee arthroplasty were presented and reviewed. The risks due to aseptic loosening, infection, stiffness, patella tracking problems, thromboembolic complications and other imponderables were discussed. The patient acknowledged the explanation, agreed to proceed with the plan and consent was signed. Patient is being admitted for inpatient treatment for surgery, pain control, PT, OT, prophylactic antibiotics, VTE prophylaxis, progressive ambulation and ADL's and discharge planning. The patient is planning to be discharged to skilled nursing facility  Jody Aguinaga A. Katilyn Miltenberger, PA-C Physician Assistant [facility] [phone]  06/03/2012, 12:10 PM 

## 2012-06-03 NOTE — Progress Notes (Signed)
Primary Physician - Dr. Abran Cantor Cardiologist - Dr. Antoine Poche EKG sept 2013, stress test oct 2013 in epic

## 2012-06-04 ENCOUNTER — Ambulatory Visit (INDEPENDENT_AMBULATORY_CARE_PROVIDER_SITE_OTHER): Payer: Medicare Other | Admitting: Family Medicine

## 2012-06-04 ENCOUNTER — Encounter: Payer: Self-pay | Admitting: Family Medicine

## 2012-06-04 VITALS — BP 138/86 | HR 93 | Temp 98.0°F | Wt 155.0 lb

## 2012-06-04 DIAGNOSIS — D649 Anemia, unspecified: Secondary | ICD-10-CM

## 2012-06-04 DIAGNOSIS — I6529 Occlusion and stenosis of unspecified carotid artery: Secondary | ICD-10-CM | POA: Diagnosis not present

## 2012-06-04 DIAGNOSIS — M1990 Unspecified osteoarthritis, unspecified site: Secondary | ICD-10-CM

## 2012-06-04 DIAGNOSIS — E039 Hypothyroidism, unspecified: Secondary | ICD-10-CM

## 2012-06-04 DIAGNOSIS — I2510 Atherosclerotic heart disease of native coronary artery without angina pectoris: Secondary | ICD-10-CM

## 2012-06-04 LAB — URINE CULTURE
Colony Count: NO GROWTH
Culture: NO GROWTH

## 2012-06-04 LAB — T4, FREE: Free T4: 1.06 ng/dL (ref 0.60–1.60)

## 2012-06-04 LAB — TSH: TSH: 1.55 u[IU]/mL (ref 0.35–5.50)

## 2012-06-04 NOTE — Telephone Encounter (Signed)
We will do so

## 2012-06-04 NOTE — Progress Notes (Signed)
Patient notified of new arrival time. Verbalized understanding

## 2012-06-04 NOTE — Progress Notes (Signed)
  Subjective:    Patient ID: Rachael [name], female    DOB: 04/30/1931, 79 y.o.   MRN: [id number]  HPI Here to recheck her thyroid status prior to a right TKR planned for next Monday per Dr. Thurston Hole. She recently had a brief bout of PSVT which resolved on its own, and no etiology was found. She has seen Cardiology and they have cleared her for surgery. She saw Lynnae Sandhoff on 06-02-12 for this. Dr. Thurston Hole wants [place] to make sure her thyroid levels are okay. Other than her joint pains she feels fine. Her last TSH a year ago was within range. Her weight is stable (this was 154 last October).    Review of Systems  Constitutional: Negative.   Respiratory: Negative.   Cardiovascular: Negative.        Objective:   Physical Exam  Constitutional: She appears well-developed and well-nourished.  Neck: No thyromegaly present.  Cardiovascular: Normal rate, regular rhythm, normal heart sounds and intact distal pulses.   Pulmonary/Chest: Effort normal and breath sounds normal.  Lymphadenopathy:    She has no cervical adenopathy.          Assessment & Plan:  We will draw a stat thyroid panel today.

## 2012-06-06 MED ORDER — CEFAZOLIN SODIUM-DEXTROSE 2-3 GM-% IV SOLR: 2.0000 g | INTRAVENOUS | Status: DC

## 2012-06-07 ENCOUNTER — Inpatient Hospital Stay (HOSPITAL_COMMUNITY)
Admission: RE | Admit: 2012-06-07 | Discharge: 2012-06-10 | DRG: 470 | Disposition: A | Discharge status: Skilled Nursing Facility | Payer: Medicare Other | Source: Ambulatory Visit | Attending: Orthopedic Surgery | Admitting: Orthopedic Surgery

## 2012-06-07 ENCOUNTER — Encounter (HOSPITAL_COMMUNITY): Payer: Self-pay | Admitting: *Deleted

## 2012-06-07 ENCOUNTER — Encounter (HOSPITAL_COMMUNITY)
Admission: RE | Disposition: A | Discharge status: Skilled Nursing Facility | Payer: Self-pay | Source: Ambulatory Visit | Attending: Orthopedic Surgery

## 2012-06-07 ENCOUNTER — Encounter (HOSPITAL_COMMUNITY): Payer: Self-pay | Admitting: Certified Registered"

## 2012-06-07 ENCOUNTER — Encounter (HOSPITAL_COMMUNITY): Payer: Self-pay | Admitting: Vascular Surgery

## 2012-06-07 ENCOUNTER — Inpatient Hospital Stay (HOSPITAL_COMMUNITY): Payer: Medicare Other | Admitting: Certified Registered"

## 2012-06-07 DIAGNOSIS — M1710 Unilateral primary osteoarthritis, unspecified knee: Secondary | ICD-10-CM | POA: Diagnosis not present

## 2012-06-07 DIAGNOSIS — R000 Tachycardia, unspecified: Secondary | ICD-10-CM | POA: Diagnosis not present

## 2012-06-07 DIAGNOSIS — Z471 Aftercare following joint replacement surgery: Secondary | ICD-10-CM | POA: Diagnosis not present

## 2012-06-07 DIAGNOSIS — M109 Gout, unspecified: Secondary | ICD-10-CM | POA: Diagnosis present

## 2012-06-07 DIAGNOSIS — Z79899 Other long term (current) drug therapy: Secondary | ICD-10-CM | POA: Diagnosis not present

## 2012-06-07 DIAGNOSIS — G8929 Other chronic pain: Secondary | ICD-10-CM | POA: Diagnosis present

## 2012-06-07 DIAGNOSIS — E039 Hypothyroidism, unspecified: Secondary | ICD-10-CM | POA: Diagnosis present

## 2012-06-07 DIAGNOSIS — D62 Acute posthemorrhagic anemia: Secondary | ICD-10-CM | POA: Diagnosis not present

## 2012-06-07 DIAGNOSIS — F329 Major depressive disorder, single episode, unspecified: Secondary | ICD-10-CM | POA: Diagnosis present

## 2012-06-07 DIAGNOSIS — Z96659 Presence of unspecified artificial knee joint: Secondary | ICD-10-CM | POA: Diagnosis not present

## 2012-06-07 DIAGNOSIS — M6281 Muscle weakness (generalized): Secondary | ICD-10-CM | POA: Diagnosis not present

## 2012-06-07 DIAGNOSIS — M545 Low back pain, unspecified: Secondary | ICD-10-CM | POA: Diagnosis present

## 2012-06-07 DIAGNOSIS — Z7901 Long term (current) use of anticoagulants: Secondary | ICD-10-CM | POA: Diagnosis not present

## 2012-06-07 DIAGNOSIS — E785 Hyperlipidemia, unspecified: Secondary | ICD-10-CM | POA: Diagnosis present

## 2012-06-07 DIAGNOSIS — K219 Gastro-esophageal reflux disease without esophagitis: Secondary | ICD-10-CM | POA: Diagnosis not present

## 2012-06-07 DIAGNOSIS — M1711 Unilateral primary osteoarthritis, right knee: Secondary | ICD-10-CM | POA: Diagnosis present

## 2012-06-07 DIAGNOSIS — F3289 Other specified depressive episodes: Secondary | ICD-10-CM | POA: Diagnosis present

## 2012-06-07 DIAGNOSIS — I2510 Atherosclerotic heart disease of native coronary artery without angina pectoris: Secondary | ICD-10-CM | POA: Diagnosis present

## 2012-06-07 DIAGNOSIS — I471 Supraventricular tachycardia, unspecified: Secondary | ICD-10-CM | POA: Diagnosis present

## 2012-06-07 DIAGNOSIS — G8918 Other acute postprocedural pain: Secondary | ICD-10-CM | POA: Diagnosis not present

## 2012-06-07 DIAGNOSIS — D649 Anemia, unspecified: Secondary | ICD-10-CM | POA: Diagnosis present

## 2012-06-07 DIAGNOSIS — K2100 Gastro-esophageal reflux disease with esophagitis, without bleeding: Secondary | ICD-10-CM | POA: Diagnosis not present

## 2012-06-07 DIAGNOSIS — R269 Unspecified abnormalities of gait and mobility: Secondary | ICD-10-CM | POA: Diagnosis not present

## 2012-06-07 DIAGNOSIS — M1990 Unspecified osteoarthritis, unspecified site: Secondary | ICD-10-CM | POA: Diagnosis not present

## 2012-06-07 DIAGNOSIS — R071 Chest pain on breathing: Secondary | ICD-10-CM | POA: Diagnosis present

## 2012-06-07 HISTORY — PX: TOTAL KNEE ARTHROPLASTY: SHX125

## 2012-06-07 SURGERY — ARTHROPLASTY, KNEE, TOTAL
Anesthesia: General | Site: Knee | Laterality: Right | Wound class: Clean

## 2012-06-07 MED ORDER — LIDOCAINE HCL (CARDIAC) 20 MG/ML IV SOLN
INTRAVENOUS | Status: DC | PRN
  Administered 2012-06-07: 40 mg via INTRAVENOUS

## 2012-06-07 MED ORDER — HYDROMORPHONE HCL PF 1 MG/ML IJ SOLN: 0.5000 mg | INTRAMUSCULAR | Status: DC | PRN

## 2012-06-07 MED ORDER — CHLORHEXIDINE GLUCONATE 4 % EX LIQD: 60.0000 mL | Freq: Once | CUTANEOUS | Status: DC

## 2012-06-07 MED ORDER — FAMOTIDINE 20 MG PO TABS
20.0000 mg | ORAL_TABLET | Freq: Two times a day (BID) | ORAL | Status: DC
  Administered 2012-06-07: 22:00:00 via ORAL
  Administered 2012-06-08 – 2012-06-10 (×5): 20 mg via ORAL
  Filled 2012-06-07 (×7): qty 1

## 2012-06-07 MED ORDER — MIDAZOLAM HCL 5 MG/5ML IJ SOLN
INTRAMUSCULAR | Status: DC | PRN
  Administered 2012-06-07: 1 mg via INTRAVENOUS

## 2012-06-07 MED ORDER — DIPHENHYDRAMINE HCL 12.5 MG/5ML PO ELIX
12.5000 mg | ORAL_SOLUTION | ORAL | Status: DC | PRN
  Administered 2012-06-09: 25 mg via ORAL
  Filled 2012-06-07: qty 10

## 2012-06-07 MED ORDER — HYDROMORPHONE HCL PF 1 MG/ML IJ SOLN
INTRAMUSCULAR | Status: AC
  Administered 2012-06-07: 0.5 mg via INTRAVENOUS
  Filled 2012-06-07: qty 1

## 2012-06-07 MED ORDER — CEFUROXIME SODIUM 1.5 G IJ SOLR
INTRAMUSCULAR | Status: AC
  Filled 2012-06-07: qty 1.5

## 2012-06-07 MED ORDER — ACETAMINOPHEN 650 MG RE SUPP: 650.0000 mg | Freq: Four times a day (QID) | RECTAL | Status: DC | PRN

## 2012-06-07 MED ORDER — CEFUROXIME SODIUM 1.5 G IJ SOLR
INTRAMUSCULAR | Status: DC | PRN
  Administered 2012-06-07: 1.5 g

## 2012-06-07 MED ORDER — SODIUM CHLORIDE 0.9 % IR SOLN
Status: DC | PRN
  Administered 2012-06-07: 1000 mL
  Administered 2012-06-07: 3000 mL

## 2012-06-07 MED ORDER — BUPIVACAINE-EPINEPHRINE PF 0.5-1:200000 % IJ SOLN
INTRAMUSCULAR | Status: DC | PRN
  Administered 2012-06-07: 20 mL

## 2012-06-07 MED ORDER — OXYCODONE HCL 5 MG PO TABS: 5.0000 mg | ORAL_TABLET | Freq: Once | ORAL | Status: DC | PRN

## 2012-06-07 MED ORDER — PHENOL 1.4 % MT LIQD: 1.0000 | OROMUCOSAL | Status: DC | PRN

## 2012-06-07 MED ORDER — HYDROMORPHONE HCL PF 1 MG/ML IJ SOLN
0.2500 mg | INTRAMUSCULAR | Status: DC | PRN
  Administered 2012-06-07 (×3): 0.5 mg via INTRAVENOUS

## 2012-06-07 MED ORDER — DEXAMETHASONE SODIUM PHOSPHATE 10 MG/ML IJ SOLN
10.0000 mg | Freq: Three times a day (TID) | INTRAMUSCULAR | Status: AC
  Filled 2012-06-07 (×3): qty 1

## 2012-06-07 MED ORDER — ROCURONIUM BROMIDE 100 MG/10ML IV SOLN
INTRAVENOUS | Status: DC | PRN
  Administered 2012-06-07: 40 mg via INTRAVENOUS

## 2012-06-07 MED ORDER — RIVAROXABAN 10 MG PO TABS
10.0000 mg | ORAL_TABLET | Freq: Every day | ORAL | Status: DC
  Administered 2012-06-08 – 2012-06-10 (×3): 10 mg via ORAL
  Filled 2012-06-07 (×4): qty 1

## 2012-06-07 MED ORDER — LACTATED RINGERS IV SOLN
INTRAVENOUS | Status: DC
  Administered 2012-06-07 (×2): via INTRAVENOUS

## 2012-06-07 MED ORDER — OXYCODONE HCL 5 MG PO TABS
5.0000 mg | ORAL_TABLET | ORAL | Status: DC | PRN
  Administered 2012-06-07 – 2012-06-10 (×10): 10 mg via ORAL
  Filled 2012-06-07 (×2): qty 2
  Filled 2012-06-07: qty 1
  Filled 2012-06-07 (×2): qty 2
  Filled 2012-06-07: qty 1
  Filled 2012-06-07 (×5): qty 2

## 2012-06-07 MED ORDER — BUPIVACAINE-EPINEPHRINE PF 0.5-1:200000 % IJ SOLN
INTRAMUSCULAR | Status: DC | PRN
  Administered 2012-06-07: 30 mL

## 2012-06-07 MED ORDER — ACETAMINOPHEN 10 MG/ML IV SOLN
1000.0000 mg | Freq: Four times a day (QID) | INTRAVENOUS | Status: AC
  Administered 2012-06-08 (×3): 1000 mg via INTRAVENOUS
  Filled 2012-06-07 (×4): qty 100

## 2012-06-07 MED ORDER — PANTOPRAZOLE SODIUM 20 MG PO TBEC
20.0000 mg | DELAYED_RELEASE_TABLET | Freq: Every day | ORAL | Status: DC
  Administered 2012-06-07 – 2012-06-10 (×4): 20 mg via ORAL
  Filled 2012-06-07 (×4): qty 1

## 2012-06-07 MED ORDER — CEFAZOLIN SODIUM-DEXTROSE 2-3 GM-% IV SOLR
INTRAVENOUS | Status: AC
  Administered 2012-06-07: 2 g via INTRAVENOUS
  Filled 2012-06-07: qty 50

## 2012-06-07 MED ORDER — LEVOTHYROXINE SODIUM 100 MCG PO TABS
100.0000 ug | ORAL_TABLET | Freq: Every day | ORAL | Status: DC
  Administered 2012-06-07 – 2012-06-10 (×4): 100 ug via ORAL
  Filled 2012-06-07 (×5): qty 1

## 2012-06-07 MED ORDER — BISACODYL 5 MG PO TBEC: 10.0000 mg | DELAYED_RELEASE_TABLET | Freq: Every day | ORAL | Status: DC | PRN

## 2012-06-07 MED ORDER — ALUM & MAG HYDROXIDE-SIMETH 200-200-20 MG/5ML PO SUSP: 30.0000 mL | ORAL | Status: DC | PRN

## 2012-06-07 MED ORDER — ACETAMINOPHEN 325 MG PO TABS: 650.0000 mg | ORAL_TABLET | Freq: Four times a day (QID) | ORAL | Status: DC | PRN

## 2012-06-07 MED ORDER — NITROGLYCERIN 0.3 MG SL SUBL: 0.3000 mg | SUBLINGUAL_TABLET | SUBLINGUAL | Status: DC | PRN

## 2012-06-07 MED ORDER — POTASSIUM CHLORIDE IN NACL 20-0.9 MEQ/L-% IV SOLN
INTRAVENOUS | Status: DC
  Administered 2012-06-07: 100 mL/h via INTRAVENOUS
  Administered 2012-06-08: 01:00:00 via INTRAVENOUS
  Filled 2012-06-07 (×9): qty 1000

## 2012-06-07 MED ORDER — NITROGLYCERIN 0.4 MG SL SUBL: 0.4000 mg | SUBLINGUAL_TABLET | SUBLINGUAL | Status: DC | PRN

## 2012-06-07 MED ORDER — OXYCODONE HCL 5 MG/5ML PO SOLN: 5.0000 mg | Freq: Once | ORAL | Status: DC | PRN

## 2012-06-07 MED ORDER — ONDANSETRON HCL 4 MG/2ML IJ SOLN
4.0000 mg | Freq: Four times a day (QID) | INTRAMUSCULAR | Status: DC | PRN
  Administered 2012-06-07: 4 mg via INTRAVENOUS
  Filled 2012-06-07: qty 2

## 2012-06-07 MED ORDER — ACETAMINOPHEN 10 MG/ML IV SOLN
INTRAVENOUS | Status: AC
  Administered 2012-06-07: 1000 mg via INTRAVENOUS
  Filled 2012-06-07: qty 100

## 2012-06-07 MED ORDER — ALLOPURINOL 100 MG PO TABS
100.0000 mg | ORAL_TABLET | Freq: Every day | ORAL | Status: DC
  Administered 2012-06-08 – 2012-06-10 (×3): 100 mg via ORAL
  Filled 2012-06-07 (×3): qty 1

## 2012-06-07 MED ORDER — ZOLPIDEM TARTRATE 5 MG PO TABS: 5.0000 mg | ORAL_TABLET | Freq: Every evening | ORAL | Status: DC | PRN

## 2012-06-07 MED ORDER — PROPOFOL 10 MG/ML IV BOLUS
INTRAVENOUS | Status: DC | PRN
  Administered 2012-06-07: 110 mg via INTRAVENOUS

## 2012-06-07 MED ORDER — METOCLOPRAMIDE HCL 5 MG/ML IJ SOLN
5.0000 mg | Freq: Three times a day (TID) | INTRAMUSCULAR | Status: DC | PRN
Start: 2012-06-07 — End: 2012-06-10

## 2012-06-07 MED ORDER — ACETAMINOPHEN 10 MG/ML IV SOLN
1000.0000 mg | Freq: Four times a day (QID) | INTRAVENOUS | Status: DC
  Filled 2012-06-07 (×3): qty 100

## 2012-06-07 MED ORDER — ONDANSETRON HCL 4 MG/2ML IJ SOLN
INTRAMUSCULAR | Status: DC | PRN
  Administered 2012-06-07: 4 mg via INTRAVENOUS

## 2012-06-07 MED ORDER — RANITIDINE HCL 150 MG PO TABS: 150.0000 mg | ORAL_TABLET | Freq: Two times a day (BID) | ORAL | Status: DC

## 2012-06-07 MED ORDER — MENTHOL 3 MG MT LOZG: 1.0000 | LOZENGE | OROMUCOSAL | Status: DC | PRN

## 2012-06-07 MED ORDER — METOCLOPRAMIDE HCL 10 MG PO TABS
5.0000 mg | ORAL_TABLET | Freq: Three times a day (TID) | ORAL | Status: DC | PRN
Start: 2012-06-07 — End: 2012-06-10

## 2012-06-07 MED ORDER — ONDANSETRON HCL 4 MG PO TABS
4.0000 mg | ORAL_TABLET | Freq: Four times a day (QID) | ORAL | Status: DC | PRN
  Administered 2012-06-08: 4 mg via ORAL
  Filled 2012-06-07: qty 1

## 2012-06-07 MED ORDER — DEXAMETHASONE 4 MG PO TABS
10.0000 mg | ORAL_TABLET | Freq: Three times a day (TID) | ORAL | Status: AC
  Administered 2012-06-07 – 2012-06-08 (×3): 10 mg via ORAL
  Filled 2012-06-07 (×3): qty 1

## 2012-06-07 MED ORDER — CEFAZOLIN SODIUM-DEXTROSE 2-3 GM-% IV SOLR
2.0000 g | Freq: Four times a day (QID) | INTRAVENOUS | Status: AC
  Administered 2012-06-07 (×2): 2 g via INTRAVENOUS
  Filled 2012-06-07 (×3): qty 50

## 2012-06-07 MED ORDER — POVIDONE-IODINE 7.5 % EX SOLN
Freq: Once | CUTANEOUS | Status: DC
Start: 2012-06-07 — End: 2012-06-07

## 2012-06-07 MED ORDER — DOCUSATE SODIUM 100 MG PO CAPS
100.0000 mg | ORAL_CAPSULE | Freq: Two times a day (BID) | ORAL | Status: DC
  Administered 2012-06-07 – 2012-06-10 (×6): 100 mg via ORAL
  Filled 2012-06-07 (×6): qty 1

## 2012-06-07 MED ORDER — FENTANYL CITRATE 0.05 MG/ML IJ SOLN
INTRAMUSCULAR | Status: DC | PRN
  Administered 2012-06-07 (×2): 50 ug via INTRAVENOUS
  Administered 2012-06-07 (×3): 100 ug via INTRAVENOUS

## 2012-06-07 SURGICAL SUPPLY — 68 items
BANDAGE ELASTIC 6 VELCRO ST LF (GAUZE/BANDAGES/DRESSINGS) ×2 IMPLANT
BANDAGE ESMARK 6X9 LF (GAUZE/BANDAGES/DRESSINGS) ×1 IMPLANT
BLADE SAGITTAL 25.0X1.19X90 (BLADE) ×2 IMPLANT
BLADE SAW SGTL 11.0X1.19X90.0M (BLADE) IMPLANT
BLADE SAW SGTL 13.0X1.19X90.0M (BLADE) ×2 IMPLANT
BLADE SURG 10 STRL SS (BLADE) ×4 IMPLANT
BNDG ELASTIC 6X15 VLCR STRL LF (GAUZE/BANDAGES/DRESSINGS) ×2 IMPLANT
BNDG ESMARK 6X9 LF (GAUZE/BANDAGES/DRESSINGS) ×2
BOWL SMART MIX CTS (DISPOSABLE) ×2 IMPLANT
CEMENT HV SMART SET (Cement) ×4 IMPLANT
CLOTH BEACON ORANGE TIMEOUT ST (SAFETY) ×2 IMPLANT
CLSR STERI-STRIP ANTIMIC 1/2X4 (GAUZE/BANDAGES/DRESSINGS) ×2 IMPLANT
COVER BACK TABLE 24X17X13 BIG (DRAPES) IMPLANT
COVER PROBE W GEL 5X96 (DRAPES) IMPLANT
COVER SURGICAL LIGHT HANDLE (MISCELLANEOUS) ×2 IMPLANT
CUFF TOURNIQUET SINGLE 34IN LL (TOURNIQUET CUFF) ×2 IMPLANT
CUFF TOURNIQUET SINGLE 44IN (TOURNIQUET CUFF) IMPLANT
DRAPE EXTREMITY T 121X128X90 (DRAPE) ×2 IMPLANT
DRAPE INCISE IOBAN 66X45 STRL (DRAPES) ×2 IMPLANT
DRAPE PROXIMA HALF (DRAPES) ×2 IMPLANT
DRAPE U-SHAPE 47X51 STRL (DRAPES) ×2 IMPLANT
DRSG ADAPTIC 3X8 NADH LF (GAUZE/BANDAGES/DRESSINGS) ×2 IMPLANT
DRSG PAD ABDOMINAL 8X10 ST (GAUZE/BANDAGES/DRESSINGS) ×4 IMPLANT
DURAPREP 26ML APPLICATOR (WOUND CARE) ×2 IMPLANT
ELECT CAUTERY BLADE 6.4 (BLADE) ×2 IMPLANT
ELECT REM PT RETURN 9FT ADLT (ELECTROSURGICAL) ×2
ELECTRODE REM PT RTRN 9FT ADLT (ELECTROSURGICAL) ×1 IMPLANT
EVACUATOR 1/8 PVC DRAIN (DRAIN) IMPLANT
FACESHIELD LNG OPTICON STERILE (SAFETY) ×2 IMPLANT
GLOVE BIO SURGEON STRL SZ7 (GLOVE) ×6 IMPLANT
GLOVE BIOGEL PI IND STRL 7.0 (GLOVE) ×4 IMPLANT
GLOVE BIOGEL PI IND STRL 7.5 (GLOVE) ×2 IMPLANT
GLOVE BIOGEL PI INDICATOR 7.0 (GLOVE) ×4
GLOVE BIOGEL PI INDICATOR 7.5 (GLOVE) ×2
GLOVE SS BIOGEL STRL SZ 7.5 (GLOVE) ×2 IMPLANT
GLOVE SUPERSENSE BIOGEL SZ 7.5 (GLOVE) ×2
GOWN PREVENTION PLUS XLARGE (GOWN DISPOSABLE) ×6 IMPLANT
GOWN STRL NON-REIN LRG LVL3 (GOWN DISPOSABLE) ×4 IMPLANT
HANDPIECE INTERPULSE COAX TIP (DISPOSABLE) ×1
HOOD PEEL AWAY FACE SHEILD DIS (HOOD) ×6 IMPLANT
IMMOBILIZER KNEE 22 UNIV (SOFTGOODS) ×2 IMPLANT
INSERT CUSHION PRONEVIEW LG (MISCELLANEOUS) ×2 IMPLANT
KIT BASIN OR (CUSTOM PROCEDURE TRAY) ×2 IMPLANT
KIT ROOM TURNOVER OR (KITS) ×2 IMPLANT
MANIFOLD NEPTUNE II (INSTRUMENTS) ×2 IMPLANT
NS IRRIG 1000ML POUR BTL (IV SOLUTION) ×2 IMPLANT
PACK TOTAL JOINT (CUSTOM PROCEDURE TRAY) ×2 IMPLANT
PAD ARMBOARD 7.5X6 YLW CONV (MISCELLANEOUS) ×4 IMPLANT
PAD CAST 4YDX4 CTTN HI CHSV (CAST SUPPLIES) ×1 IMPLANT
PADDING CAST COTTON 4X4 STRL (CAST SUPPLIES) ×1
PADDING CAST COTTON 6X4 STRL (CAST SUPPLIES) ×2 IMPLANT
POSITIONER HEAD PRONE TRACH (MISCELLANEOUS) ×2 IMPLANT
RUBBERBAND STERILE (MISCELLANEOUS) ×2 IMPLANT
SET HNDPC FAN SPRY TIP SCT (DISPOSABLE) ×1 IMPLANT
SPONGE GAUZE 4X4 12PLY (GAUZE/BANDAGES/DRESSINGS) ×2 IMPLANT
STRIP CLOSURE SKIN 1/2X4 (GAUZE/BANDAGES/DRESSINGS) ×2 IMPLANT
SUCTION FRAZIER TIP 10 FR DISP (SUCTIONS) ×2 IMPLANT
SUT ETHIBOND NAB CT1 #1 30IN (SUTURE) IMPLANT
SUT MNCRL AB 3-0 PS2 18 (SUTURE) ×2 IMPLANT
SUT VIC AB 0 CT1 27 (SUTURE) ×3
SUT VIC AB 0 CT1 27XBRD ANBCTR (SUTURE) ×3 IMPLANT
SUT VIC AB 2-0 CT1 27 (SUTURE) ×2
SUT VIC AB 2-0 CT1 TAPERPNT 27 (SUTURE) ×2 IMPLANT
SYR 30ML SLIP (SYRINGE) ×2 IMPLANT
TOWEL OR 17X24 6PK STRL BLUE (TOWEL DISPOSABLE) ×2 IMPLANT
TOWEL OR 17X26 10 PK STRL BLUE (TOWEL DISPOSABLE) ×2 IMPLANT
TRAY FOLEY CATH 14FR (SET/KITS/TRAYS/PACK) ×2 IMPLANT
WATER STERILE IRR 1000ML POUR (IV SOLUTION) ×6 IMPLANT

## 2012-06-07 NOTE — Interval H&P Note (Signed)
History and Physical Interval Note:  06/07/2012 9:11 AM  Rachael [name]  has presented today for surgery, with the diagnosis of Right total knee replacement  The various methods of treatment have been discussed with the patient and family. After consideration of risks, benefits and other options for treatment, the patient has consented to  Total knee replacement on the RIGHT as a surgical intervention .  The patient's history has been reviewed, patient examined, no change in status, stable for surgery.  I have reviewed the patient's chart and labs.  Questions were answered to the patient's satisfaction.     Salvatore Marvel A

## 2012-06-07 NOTE — Op Note (Signed)
MRN:     [id number] DOB/AGE:    Dec 19, 1930 / 79 y.o.       OPERATIVE REPORT    DATE OF PROCEDURE:  06/07/2012       PREOPERATIVE DIAGNOSIS:   Right total knee replacement      Estimated Body mass index is 25.96 kg/(m^2) as calculated from the following:   Height as of 06/03/12: 5\' 5" (1.651 m).   Weight as of 06/03/12: 156 lb(70.761 kg).                                                        POSTOPERATIVE DIAGNOSIS:   Right total knee replacement                                                                      PROCEDURE:  Procedure(s): TOTAL KNEE ARTHROPLASTY Using Depuy Sigma RP implants #2.5 Femur, #4Tibia, 15mm sigma RP bearing, 35 Patella     SURGEON: Leshea Jaggers A    ASSISTANT:  Kirstin Shepperson PA-C   (Present and scrubbed throughout the case, critical for assistance with exposure, retraction, instrumentation, and closure.)         ANESTHESIA: GET with Femoral Nerve Block  DRAINS: foley, 2 medium hemovac in knee   TOURNIQUET TIME:   COMPLICATIONS:  None     SPECIMENS: None   INDICATIONS FOR PROCEDURE: The patient has  Right total knee replacement, varus deformities, XR shows bone on bone arthritis. Patient has failed all conservative measures including anti-inflammatory medicines, narcotics, attempts at  exercise and weight loss, cortisone injections and viscosupplementation.  Risks and benefits of surgery have been discussed, questions answered.   DESCRIPTION OF PROCEDURE: The patient identified by armband, received  right femoral nerve block and IV antibiotics, in the holding area at [facility]. Patient taken to the operating room, appropriate anesthetic  monitors were attached General endotracheal anesthesia induced with  the patient in supine position, Foley catheter was inserted. Tourniquet  applied high to the operative thigh. Lateral post and foot positioner  applied to the table, the lower extremity was then prepped and draped  in usual sterile  fashion from the ankle to the tourniquet. Time-out procedure was performed. The limb was wrapped with an Esmarch bandage and the tourniquet inflated to 365 mmHg. We began the operation by making the anterior midline incision starting at handbreadth above the patella going over the patella 1 cm medial to and  4 cm distal to the tibial tubercle. Small bleeders in the skin and the  subcutaneous tissue identified and cauterized. Transverse retinaculum was incised and reflected medially and a medial parapatellar arthrotomy was accomplished. the patella was everted and theprepatellar fat pad resected. The superficial medial collateral  ligament was then elevated from anterior to posterior along the proximal  flare of the tibia and anterior half of the menisci resected. The knee was hyperflexed exposing bone on bone arthritis. Peripheral and notch osteophytes as well as the cruciate ligaments were then resected. We continued to  work our way around posteriorly along the proximal tibia, and externally  rotated the tibia subluxing it out from underneath the femur. A McHale  retractor was placed through the notch and a lateral Hohmann retractor  placed, and we then drilled through the proximal tibia in line with the  axis of the tibia followed by an intramedullary guide rod and 2-degree  posterior slope cutting guide. The tibial cutting guide was pinned into place  allowing resection of 5 mm of bone medially and about 1 mm of bone  laterally because of her valgus deformity. Satisfied with the tibial resection, we then  entered the distal femur 2 mm anterior to the PCL origin with the  intramedullary guide rod and applied the distal femoral cutting guide  set at 10mm, with 5 degrees of valgus. This was pinned along the  epicondylar axis. At this point, the distal femoral cut was accomplished without difficulty. We then sized for a #2.5 femoral component and pinned the guide in 3 degrees of external rotation.The  chamfer cutting guide was pinned into place. The anterior, posterior, and chamfer cuts were accomplished without difficulty followed by  the Sigma RP box cutting guide and the box cut. We also removed posterior osteophytes from the posterior femoral condyles. At this  time, the knee was brought into full extension. We checked our  extension and flexion gaps and found them symmetric at 15mm.  The patella thickness measured at 21 mm. We set the cutting guide at 12 and removed the posterior 9.5-10 mm  of the patella sized for 35 button and drilled the lollipop. The knee  was then once again hyperflexed exposing the proximal tibia. We sized for a #4 tibial base plate, applied the smokestack and the conical reamer followed by the the Delta fin keel punch. We then hammered into place the Sigma RP trial femoral component, inserted a 15-mm trial bearing, trial patellar button, and took the knee through range of motion from 0-130 degrees. No thumb pressure was required for patellar  Tracking. There was excellent balancing noted with no need for a posterolateral release.  At this point, all trial components were removed, a double batch of DePuy HV cement with 1500 mg of Zinacef was mixed and applied to all bony metallic mating surfaces except for the posterior condyles of the femur itself. In order, we  hammered into place the tibial tray and removed excess cement, the femoral component and removed excess cement, a 15-mm Sigma RP bearing  was inserted, and the knee brought to full extension with compression.  The patellar button was clamped into place, and excess cement  removed. While the cement cured the wound was irrigated out with normal saline solution pulse lavage, and medium Hemovac drains were placed.. Ligament stability and patellar tracking were checked and found to be excellent. The tourniquet was then released and hemostasis was obtained with cautery. The parapatellar arthrotomy was closed with  #1  ethibond suture. The subcutaneous tissue with 0 and 2-0 undyed  Vicryl suture, and 4-0 Monocryl.. A dressing of Xeroform,  4 x 4, dressing sponges, Webril, and Ace wrap applied. Needle and sponge count were correct times 2.The patient awakened, extubated, and taken to recovery room without difficulty. Vascular status was normal, pulses 2+ and symmetric.   Daijon Wenke A 06/07/2012, 11:04 AM

## 2012-06-07 NOTE — Progress Notes (Signed)
Physical Therapy Evaluation Patient Details Name: Rachael [name] MRN: [id number] DOB: 09-20-1930 Today's Date: 06/07/2012 Time: 1478-2956 PT Time Calculation (min): 33 min  PT Assessment / Plan / Recommendation Clinical Impression  Pt is 79 yo female s/p right TKA who was able to transfer bed to chair with RW and +2 tot A, pt 70%, on POD 0. Pt was nauseous and light-headed after transfer, VSS, nsg notified. Recommend SNF for rehab. PT will follow acutely to progress ROM and strength of the right knee as well as safe mobility. Recommend RW for d/c.     PT Assessment  Patient needs continued PT services    Follow Up Recommendations  SNF    Does the patient have the potential to tolerate intense rehabilitation      Barriers to Discharge Decreased caregiver support plans to go to SNF short term    Equipment Recommendations  Rolling walker with 5" wheels    Recommendations for Other Services OT consult   Frequency 7X/week    Precautions / Restrictions Precautions Precautions: Knee Precaution Comments: reviewed proper positioning Required Braces or Orthoses: Knee Immobilizer - Right Knee Immobilizer - Right: Discontinue once straight leg raise with < 10 degree lag Restrictions Weight Bearing Restrictions: Yes RLE Weight Bearing: Weight bearing as tolerated   Pertinent Vitals/Pain O2 sats 95% on 2L O2, HR 71 bpm, BP 139/65 No c/o pain yet      Mobility  Bed Mobility Bed Mobility: Rolling Right;Right Sidelying to Sit Rolling Right: 4: Min assist Right Sidelying to Sit: 4: Min assist Details for Bed Mobility Assistance: vc's for rolling and min A to RLE Transfers Transfers: Sit to Stand;Stand to Sit Sit to Stand: 1: +2 Total assist;From bed;With upper extremity assist Sit to Stand: Patient Percentage: 70% Stand to Sit: 1: +2 Total assist;To chair/3-in-1;With upper extremity assist Stand to Sit: Patient Percentage: 70% Details for Transfer Assistance: vc's for hand  placement, pt not familiar with use of RW. +2 assist for safety as pt began feeling nauseous and vomited sitting EOB. Nursing notified.  Ambulation/Gait Ambulation/Gait Assistance: 1: +2 Total assist Ambulation/Gait: Patient Percentage: 70% Ambulation Distance (Feet): 2 Feet Assistive device: Rolling walker Ambulation/Gait Assistance Details: pt ambulated bed to chair with vc's to sequence. KI on and necessary as right knee was felt to be buckling inside KI. Pt with sufficient strength bilateral UE's and LLE to hold self up despite buckling. vc's to slide right LE out before sitting but pt needing assist to do so.  Gait Pattern: Step-to pattern;Decreased weight shift to right;Decreased stance time - right Stairs: No Wheelchair Mobility Wheelchair Mobility: No    Shoulder Instructions     Exercises Total Joint Exercises Ankle Circles/Pumps: AROM;20 reps;Seated;Both Quad Sets:  (educated on exercises but pt nauseous, did not complete)   PT Diagnosis: Difficulty walking;Abnormality of gait  PT Problem List: Decreased strength;Decreased range of motion;Decreased activity tolerance;Decreased balance;Decreased mobility;Decreased knowledge of use of DME;Decreased knowledge of precautions PT Treatment Interventions: DME instruction;Gait training;Functional mobility training;Therapeutic activities;Therapeutic exercise;Balance training;Neuromuscular re-education;Patient/family education   PT Goals Acute Rehab PT Goals PT Goal Formulation: With patient/family Time For Goal Achievement: 06/14/12 Potential to Achieve Goals: Good Pt will go Supine/Side to Sit: with supervision PT Goal: Supine/Side to Sit - Progress: Goal set today Pt will go Sit to Supine/Side: with supervision PT Goal: Sit to Supine/Side - Progress: Goal set today Pt will go Sit to Stand: with supervision PT Goal: Sit to Stand - Progress: Goal set today Pt will go Stand  to Sit: with supervision PT Goal: Stand to Sit - Progress:  Goal set today Pt will Ambulate: >150 feet;with supervision;with rolling walker PT Goal: Ambulate - Progress: Goal set today Pt will Perform Home Exercise Program: with supervision, verbal cues required/provided PT Goal: Perform Home Exercise Program - Progress: Goal set today  Visit Information  Last PT Received On: 06/07/12 Assistance Needed: +1    Subjective Data  Subjective: I will do whatever I need to do to get home fast Patient Stated Goal: eventual return home but pt agreeable to [facility] first   Prior Functioning  Home Living Lives With: Daughter Available Help at Discharge: Skilled Nursing Facility Type of Home: House Home Adaptive Equipment: None Additional Comments: pt's daughter works during the day Prior Function Level of Independence: Independent Able to Take Stairs?: Yes Driving: Yes Vocation: Retired Musician: No difficulties    Cognition  Overall Cognitive Status: Appears within functional limits for tasks assessed/performed Arousal/Alertness: Awake/alert Orientation Level: Appears intact for tasks assessed Behavior During Session: [facility] for tasks performed    Extremity/Trunk Assessment Right Upper Extremity Assessment RUE ROM/Strength/Tone: [facility] for tasks assessed Left Upper Extremity Assessment LUE ROM/Strength/Tone: WFL for tasks assessed Right Lower Extremity Assessment RLE ROM/Strength/Tone: Deficits RLE ROM/Strength/Tone Deficits: hip flex 2/5, knee ext 0/5 (nerve block in affect) RLE Sensation: WFL - Light Touch Left Lower Extremity Assessment LLE ROM/Strength/Tone: Within functional levels LLE Sensation: WFL - Light Touch;WFL - Proprioception LLE Coordination: WFL - gross motor Trunk Assessment Trunk Assessment: Normal   Balance Balance Balance Assessed: No  End of Session PT - End of Session Equipment Utilized During Treatment: Gait belt;Right knee immobilizer Activity Tolerance: Other (comment) (limited by  nausea) Patient left: in chair;with call bell/phone within reach;with family/visitor present Nurse Communication: Other (comment) (nausea) CPM Right Knee CPM Right Knee: Off Right Knee Flexion (Degrees): 60  Right Knee Extension (Degrees): 0   GP   Lyanne Co, PT  Acute Rehab Services  [phone]   Lyanne Co 06/07/2012, 4:16 PM

## 2012-06-07 NOTE — Anesthesia Preprocedure Evaluation (Addendum)
Anesthesia Evaluation  Patient identified by MRN, date of birth, ID band Patient awake    Reviewed: Allergy & Precautions, H&P , NPO status , Patient's Chart, lab work & pertinent test results  Airway Mallampati: II TM Distance: >3 FB Neck ROM: Full    Dental  (+) Chipped, Teeth Intact and Dental Advisory Given   Pulmonary shortness of breath,  breath sounds clear to auscultation        Cardiovascular + CAD and + Peripheral Vascular Disease Rhythm:Regular Rate:Normal     Neuro/Psych PSYCHIATRIC DISORDERS Anxiety Depression    GI/Hepatic hiatal hernia, GERD-  ,  Endo/Other  Hypothyroidism   Renal/GU      Musculoskeletal   Abdominal   Peds  Hematology   Anesthesia Other Findings   Reproductive/Obstetrics                          Anesthesia Physical Anesthesia Plan  ASA: III  Anesthesia Plan: General and Regional   Post-op Pain Management:    Induction: Intravenous  Airway Management Planned: Oral ETT  Additional Equipment:   Intra-op Plan:   Post-operative Plan: Extubation in OR  Informed Consent: I have reviewed the patients History and Physical, chart, labs and discussed the procedure including the risks, benefits and alternatives for the proposed anesthesia with the patient or authorized representative who has indicated his/her understanding and acceptance.   Dental advisory given  Plan Discussed with: CRNA, Anesthesiologist and Surgeon  Anesthesia Plan Comments:        Anesthesia Quick Evaluation

## 2012-06-07 NOTE — OR Nursing (Signed)
The consent and procedure varifies a right Total knee replacement however the Time-out entries reflect left knee but the correct site is right knee.

## 2012-06-07 NOTE — Progress Notes (Signed)
UR COMPLETED  

## 2012-06-07 NOTE — Anesthesia Postprocedure Evaluation (Signed)
  Anesthesia Post-op Note  Patient: Rachael [name]  Procedure(s) Performed: Procedure(s) (LRB) with comments: TOTAL KNEE ARTHROPLASTY (Right)  Patient Location: PACU  Anesthesia Type:GA combined with regional for post-op pain  Level of Consciousness: awake and alert   Airway and Oxygen Therapy: Patient Spontanous Breathing and Patient connected to nasal cannula oxygen  Post-op Pain: moderate  Post-op Assessment: Post-op Vital signs reviewed, Patient's Cardiovascular Status Stable, Respiratory Function Stable and Patent Airway  Post-op Vital Signs: Reviewed and stable  Complications: No apparent anesthesia complications

## 2012-06-07 NOTE — H&P (View-Only) (Signed)
TOTAL KNEE ADMISSION H&P  Patient is being admitted for right total knee arthroplasty.  Subjective:  Chief Complaint:right knee pain.  HPI: Rachael [name], 79 y.o. female, has a history of pain and functional disability in the right knee due to arthritis and has failed non-surgical conservative treatments for greater than 12 weeks to includeNSAID's and/or analgesics, corticosteriod injections, flexibility and strengthening excercises, supervised PT with diminished ADL's post treatment, use of assistive devices and activity modification.  Onset of symptoms was gradual, starting 8 years ago with gradually worsening course since that time. The patient noted no past surgery on the right knee(s).  Patient currently rates pain in the right knee(s) at 8 out of 10 with activity. Patient has night pain, worsening of pain with activity and weight bearing, pain that interferes with activities of daily living, crepitus and joint swelling.  Patient has evidence of subchondral sclerosis, joint subluxation and joint space narrowing by imaging studies.  There is no active infection.  Patient Active Problem List   Diagnosis Date Noted  . CAD (coronary artery disease)   . Hypothyroidism   . Hyperglycemia   . Depression   . Anemia   . Diverticulitis of colon   . Arthritis   . Gout   . Right knee DJD   . Supraventricular tachycardia 04/07/2012  . CAROTID ARTERY DISEASE 07/31/2010  . ANGINA, HX OF 07/30/2010  . HIATAL HERNIA 06/17/2010  . ANKLE PAIN, RIGHT 05/01/2010  . CONJUNCTIVITIS 02/08/2010  . BUNION, LEFT FOOT 08/23/2009  . DIVERTICULITIS OF COLON 08/21/2009  . UNSPEC SYMPTOM ASSOC W/FEMALE GENITAL ORGANS 12/05/2008  . ALLERGIC RHINITIS 08/15/2008  . CHEST WALL PAIN, ANTERIOR 08/15/2008  . HYPERGLYCEMIA 06/21/2008  . ACUTE BRONCHITIS 01/07/2008  . OSTEOARTHRITIS 11/25/2007  . FOOT PAIN 11/25/2007  . HYPOTHYROIDISM 04/27/2007  . HYPERLIPIDEMIA 04/27/2007  . ANEMIA-NOS 04/27/2007  .  DEPRESSION 04/27/2007  . CORONARY ARTERY DISEASE 04/27/2007  . GERD 04/27/2007  . LOW BACK PAIN, CHRONIC 04/27/2007  . FATIGUE 04/27/2007   Past Medical History  Diagnosis Date  . GERD (gastroesophageal reflux disease)   . Gout     has seen Dr. Alben Deeds  . CAD (coronary artery disease)     sees Dr. Antoine Poche, normal Myoview stress test 02-26-12   . Hypothyroidism   . Hyperglycemia   . Depression   . Anemia   . Diverticulitis of colon   . Arthritis   . Supraventricular tachycardia 04/07/12    In Dr Sherene Sires office  . Right knee DJD     Past Surgical History  Procedure Date  . Hiatal hernia repair 09-21-10    per Dr. Luretha Murphy, lap [employer]   . Hernia repair      (Not in a hospital admission) Allergies  Allergen Reactions  . Meperidine Hcl Other (See Comments)    Unknown   . Sulfonamide Derivatives Other (See Comments)    Happened about 15 years ago    History  Substance Use Topics  . Smoking status: Never Smoker   . Smokeless tobacco: Never Used  . Alcohol Use: No    Family History  Problem Relation Age of Onset  . Diabetes Mellitus II    . Heart disease       Review of Systems  Constitutional: Negative.   HENT: Negative.   Eyes: Negative.   Respiratory: Negative.   Cardiovascular: Negative.   Gastrointestinal: Negative.   Genitourinary: Negative.   Musculoskeletal:       Right knee pain and valus  Skin: Negative.   Neurological: Negative.   Endo/Heme/Allergies: Negative.   Psychiatric/Behavioral: Negative.     Objective:  Physical Exam  Constitutional: She is oriented to person, place, and time. She appears well-developed and well-nourished.  HENT:  Head: Normocephalic and atraumatic.  Mouth/Throat: Oropharynx is clear and moist.  Eyes: EOM are normal. Pupils are equal, round, and reactive to light.  Neck: Neck supple.  Cardiovascular: Normal rate and regular rhythm.   Respiratory: Effort normal.  GI: Soft.  Musculoskeletal:        Examination of her right knee reveals significant valgus deformity, 1+ synovitis diffuse pain, range of motion -5 to 120 degrees knee is stable with normal patella tracking. Exam of the left knee reveals moderate valgus deformity with diffuse pain, range of motion from -5 to 125 degrees knee is stable with normal patella tracking. Vascular exam: pulses 2+ and symmetric.  Neurological: She is alert and oriented to person, place, and time.  Skin: Skin is warm and dry.  Psychiatric: She has a normal mood and affect. Her behavior is normal. Judgment and thought content normal.    Vital signs in last 24 hours: Last recorded: 01/09 1100   BP: 138/81 Pulse: 78  Temp: 97.7 F (36.5 C)    Height: 5\' 5"  (1.651 m) SpO2: 95  Weight: 70.761 kg (156 lb)     Labs:   Estimated Body mass index is 25.96 kg/(m^2) as calculated from the following:   Height as of this encounter: 5\' 5" (1.651 m).   Weight as of this encounter: 156 lb(70.761 kg).   Imaging Review Plain radiographs demonstrate severe degenerative joint disease of the right knee(s). The overall alignment issignificant valgus. The bone quality appears to be adequate for age and reported activity level.  Assessment/Plan:  End stage arthritis, right knee  Patient Active Problem List  Diagnosis  . HYPOTHYROIDISM  . HYPERLIPIDEMIA  . ANEMIA-NOS  . DEPRESSION  . CONJUNCTIVITIS  . CORONARY ARTERY DISEASE  . ACUTE BRONCHITIS  . ALLERGIC RHINITIS  . GERD  . DIVERTICULITIS OF COLON  . UNSPEC SYMPTOM ASSOC W/FEMALE GENITAL ORGANS  . OSTEOARTHRITIS  . ANKLE PAIN, RIGHT  . LOW BACK PAIN, CHRONIC  . BUNION, LEFT FOOT  . FOOT PAIN  . FATIGUE  . CHEST WALL PAIN, ANTERIOR  . HYPERGLYCEMIA  . HIATAL HERNIA  . CAROTID ARTERY DISEASE  . ANGINA, HX OF  . Supraventricular tachycardia  . CAD (coronary artery disease)  . Hypothyroidism  . Hyperglycemia  . Depression  . Anemia  . Diverticulitis of colon  . Arthritis  . Gout  . Right  knee DJD    On 04-07-12 this 79 year-old female came in to discuss knee pain and to discuss a total knee replacement.   Height: 5?5.  Weight: 148 pounds.  Respirations: 20.  Temperature: 98.4.  Blood pressure: 123/83.  Pulse: 191.  She was evaluated by Dr. Lendon Colonel, who felt she was in SVT.  I spoke to Dr. Jenene Slicker nurse who referred [place] to the emergency room.  EMS was called.  By the time EMS arrived her pulse was normal sinus rhythm and was back down to 91.  I once again spoke to Dr. Jenene Slicker nurse, she agreed to work Rachael [name] in to their schedule today.  Rachael [name] has been setup for Holter monitoring for the next 21 days.  She underwent the holter monitoring and has since been cleared for a total knee by cardiology   The patient history, physical examination,  clinical judgment of the provider and imaging studies are consistent with end stage degenerative joint disease of the right knee(s) and total knee arthroplasty is deemed medically necessary. The treatment options including medical management, injection therapy arthroscopy and arthroplasty were discussed at length. The risks and benefits of total knee arthroplasty were presented and reviewed. The risks due to aseptic loosening, infection, stiffness, patella tracking problems, thromboembolic complications and other imponderables were discussed. The patient acknowledged the explanation, agreed to proceed with the plan and consent was signed. Patient is being admitted for inpatient treatment for surgery, pain control, PT, OT, prophylactic antibiotics, VTE prophylaxis, progressive ambulation and ADL's and discharge planning. The patient is planning to be discharged to skilled nursing facility  Kaesha Kirsch A. Gwinda Passe Physician Assistant [facility] [phone]  06/03/2012, 12:10 PM

## 2012-06-07 NOTE — Transfer of Care (Signed)
Immediate Anesthesia Transfer of Care Note  Patient: Rachael [name]  Procedure(s) Performed: Procedure(s) (LRB) with comments: TOTAL KNEE ARTHROPLASTY (Right)  Patient Location: PACU  Anesthesia Type:GA combined with regional for post-op pain  Level of Consciousness: awake  Airway & Oxygen Therapy: Patient Spontanous Breathing and Patient connected to nasal cannula oxygen  Post-op Assessment: Report given to PACU RN, Post -op Vital signs reviewed and stable and Patient moving all extremities  Post vital signs: Reviewed and stable  Complications: No apparent anesthesia complications

## 2012-06-07 NOTE — Preoperative (Signed)
Beta Blockers   Reason not to administer Beta Blockers:Not Applicable 

## 2012-06-07 NOTE — Progress Notes (Signed)
Orthopedic Tech Progress Note Patient Details:  Rachael [name] Nov 06, 1930 [id number] CPM applied to Right LE with appropriate settings. No OHF available at this time.  CPM Right Knee CPM Right Knee: On Right Knee Flexion (Degrees): 60  Right Knee Extension (Degrees): 0    Asia R Thompson 06/07/2012, 12:44 PM

## 2012-06-07 NOTE — Anesthesia Procedure Notes (Signed)
Anesthesia Regional Block:  Femoral nerve block  Pre-Anesthetic Checklist: ,, timeout performed, Correct Patient, Correct Site, Correct Laterality, Correct Procedure, Correct Position, site marked, Risks and benefits discussed, pre-op evaluation,  At surgeon's request and post-op pain management  Laterality: Right  Prep: Maximum Sterile Barrier Precautions used and chloraprep       Needles:  Injection technique: Single-shot  Needle Type: Echogenic Stimulator Needle      Needle Gauge: 22 and 22 G    Additional Needles:  Procedures: ultrasound guided (picture in chart) and nerve stimulator Femoral nerve block  Nerve Stimulator or Paresthesia:  Response: Patellar respose, 0.4 mA,   Additional Responses:   Narrative:  Start time: 06/07/2012 9:17 AM End time: 06/07/2012 9:25 AM Injection made incrementally with aspirations every 5 mL. Anesthesiologist: Walter Min,MD  Additional Notes: 2% Lidocaine skin wheel.   Femoral nerve block

## 2012-06-08 ENCOUNTER — Encounter (HOSPITAL_COMMUNITY): Payer: Self-pay | Admitting: Orthopedic Surgery

## 2012-06-08 LAB — CBC
Hemoglobin: 11.1 g/dL — ABNORMAL LOW (ref 12.0–15.0)
MCH: 29 pg (ref 26.0–34.0)
MCHC: 33 g/dL (ref 30.0–36.0)
Platelets: 251 10*3/uL (ref 150–400)
RDW: 13.9 % (ref 11.5–15.5)

## 2012-06-08 LAB — BASIC METABOLIC PANEL
Calcium: 8.7 mg/dL (ref 8.4–10.5)
GFR calc Af Amer: >90 mL/min (ref >90)
GFR calc non Af Amer: 78 mL/min — ABNORMAL LOW (ref >90)
Potassium: 4.2 mEq/L (ref 3.5–5.1)
Sodium: 138 mEq/L (ref 135–145)

## 2012-06-08 NOTE — Progress Notes (Signed)
06/08/12 at 1030 am  Patient very upset with RN when RN walked into the room. Secretary called RN about patient needing to go to the bathroom. RN in another room with patient taking them to the restroom because they called first. When RN came to room 15 minutes after initial call, Patient very upset with RN. Patient asked if they called me, and why I was not unable to come. I explained to the patient that I was with another patient that I could not leave. Patient stated "I am angry because they should have sent someone else to help me." RN tried to explain to the patient that RN came as soon as possible. NT followed RN into room and we both helped patient to the bathroom and back to the chair and apologized for not being able to come sooner.  Then the patient stated "My line has not been hooked up for 2 hours and am I getting antibiotics and tylenol and it is very important that I get these medications!" RN agreed with the patient, but explained and showed the patient the IV fluid bags were empty, and they were the old doses of medication. Patient also informed that the next dose of tylenol was due at 1200, and that this was the last dose of medication. Patient also upset because the fluids were not running. RN asked patient if she had been drinking fluids, and patient stated she had. Then RN explained to patient that it was ok for her not to have fluids running for just 2 hours. Patient was reconnected to running fluids at this time.    Now, each time I enter the room, the patient tells me the story about the IV. Patiet still upset about this mornings events evn thou RN has apologized 2 or more times for her not being hooked up to her fluids and some one not coming to help her sooner.

## 2012-06-08 NOTE — Progress Notes (Signed)
Physical Therapy Treatment Patient Details Name: Rachael [name] MRN: [id number] DOB: 1931/04/06 Today's Date: 06/08/2012 Time: 4098-1191 PT Time Calculation (min): 26 min  PT Assessment / Plan / Recommendation Comments on Treatment Session  pt presents with R TKA.  pt moving better today and seems motivated to improve mobility.  pt often needs re-direction as pt can be very talkative.      Follow Up Recommendations  SNF     Does the patient have the potential to tolerate intense rehabilitation     Barriers to Discharge        Equipment Recommendations  Rolling walker with 5" wheels    Recommendations for Other Services OT consult  Frequency 7X/week   Plan Discharge plan remains appropriate;Frequency remains appropriate    Precautions / Restrictions Precautions Precautions: Knee Precaution Comments: reviewed proper positioning Required Braces or Orthoses: Knee Immobilizer - Right Knee Immobilizer - Right: Discontinue once straight leg raise with < 10 degree lag Restrictions Weight Bearing Restrictions: Yes RLE Weight Bearing: Weight bearing as tolerated   Pertinent Vitals/Pain Indicates pain better now than before surgery.      Mobility  Bed Mobility Bed Mobility: Not assessed Transfers Transfers: Sit to Stand;Stand to Sit Sit to Stand: 4: Min assist;With upper extremity assist;From chair/3-in-1;With armrests Stand to Sit: 4: Min assist;With upper extremity assist;To chair/3-in-1;With armrests Details for Transfer Assistance: cues for UE use and to control descent with sitting.  pt required ModA with sitting and standing from toilet.   Ambulation/Gait Ambulation/Gait Assistance: 4: Min assist Ambulation Distance (Feet): 60 Feet Assistive device: Rolling walker Ambulation/Gait Assistance Details: cues for gait sequencing, upright posture, positioning in RW.  cues to attend to task.   Gait Pattern: Decreased step length - left;Decreased stance time - right;Trunk  flexed Stairs: No Wheelchair Mobility Wheelchair Mobility: No    Exercises Total Joint Exercises Ankle Circles/Pumps: AROM;Both;10 reps Quad Sets: AROM;Both;10 reps Long Arc Quad: AAROM;Right;10 reps Knee Flexion: AAROM;Right;10 reps Goniometric ROM: AAROM ~ 15- 90   PT Diagnosis:    PT Problem List:   PT Treatment Interventions:     PT Goals Acute Rehab PT Goals Time For Goal Achievement: 06/14/12 Potential to Achieve Goals: Good PT Goal: Sit to Stand - Progress: Progressing toward goal PT Goal: Stand to Sit - Progress: Progressing toward goal PT Goal: Ambulate - Progress: Progressing toward goal PT Goal: Perform Home Exercise Program - Progress: Progressing toward goal  Visit Information  Last PT Received On: 06/08/12 Assistance Needed: +1    Subjective Data  Subjective: Am I doing good?     Cognition  Overall Cognitive Status: Appears within functional limits for tasks assessed/performed Arousal/Alertness: Awake/alert Orientation Level: Appears intact for tasks assessed Behavior During Session: [facility] for tasks performed Cognition - Other Comments: pt very talkative and needs re-direction at times.      Balance  Balance Balance Assessed: No  End of Session PT - End of Session Equipment Utilized During Treatment: Gait belt;Right knee immobilizer Activity Tolerance: Patient tolerated treatment well Patient left: in chair;with call bell/phone within reach Nurse Communication: Mobility status   GP     Sunny Schlein, [place] [phone] 06/08/2012, 11:07 AM

## 2012-06-08 NOTE — Evaluation (Signed)
Occupational Therapy Evaluation Patient Details Name: REGENE MCCARTHY MRN: [id number] DOB: [birth year] Today's Date: 06/08/2012 Time: 4098-1191 OT Time Calculation (min): 27 min  OT Assessment / Plan / Recommendation Clinical Impression  Pt dmeos decline in function with ADLs, strength, balance and safety and would benefit from skilled OT services to restore PLOF to return home safely    OT Assessment  Patient needs continued OT Services    Follow Up Recommendations  SNF    Barriers to Discharge Decreased caregiver support    Equipment Recommendations       Recommendations for Other Services    Frequency  Min 2X/week    Precautions / Restrictions Precautions Precautions: Knee Precaution Comments: reviewed proper positioning Required Braces or Orthoses: Knee Immobilizer - Right Knee Immobilizer - Right: Discontinue once straight leg raise with < 10 degree lag Restrictions Weight Bearing Restrictions: Yes RLE Weight Bearing: Weight bearing as tolerated       ADL  Eating/Feeding: Performed;Independent Where Assessed - Eating/Feeding: Chair Grooming: Performed;Wash/dry hands;Wash/dry face;Min guard Where Assessed - Grooming: Supported standing Upper Body Bathing: Simulated;Supervision/safety;Set up Where Assessed - Upper Body Bathing: Unsupported sitting Lower Body Bathing: Moderate assistance Where Assessed - Lower Body Bathing: Unsupported sitting Upper Body Dressing: Performed;Supervision/safety;Set up Where Assessed - Upper Body Dressing: Unsupported sitting Lower Body Dressing: Moderate assistance Where Assessed - Lower Body Dressing: Unsupported sitting Toilet Transfer: Performed;Min guard Toilet Transfer Method: Other (comment) (ambulating with RW) Toilet Transfer Equipment: Raised toilet seat with arms (or 3-in-1 over toilet) Toileting - Clothing Manipulation and Hygiene: Performed;Minimal assistance Where Assessed - Glass blower/designer Manipulation and Hygiene:  Standing Equipment Used: Gait belt;Rolling walker;Sock aid;Long-handled shoe horn;Long-handled sponge;Reacher ADL Comments: pt provided with education and demo of ADL A/E for home use    OT Diagnosis: Generalized weakness  OT Problem List: Decreased strength;Decreased activity tolerance;Decreased knowledge of use of DME or AE;Impaired balance (sitting and/or standing) OT Treatment Interventions: Self-care/ADL training;Therapeutic activities;Therapeutic exercise;Neuromuscular education;DME and/or AE instruction;Patient/family education;Balance training   OT Goals Acute Rehab OT Goals OT Goal Formulation: With patient Time For Goal Achievement: 06/15/12 Potential to Achieve Goals: Good ADL Goals Pt Will Perform Grooming: with supervision;Standing at sink ADL Goal: Grooming - Progress: Goal set today Pt Will Perform Lower Body Bathing: with min assist;with adaptive equipment ADL Goal: Lower Body Bathing - Progress: Goal set today Pt Will Perform Lower Body Dressing: with min assist;with adaptive equipment ADL Goal: Lower Body Dressing - Progress: Goal set today Pt Will Transfer to Toilet: with supervision;with DME;Grab bars ADL Goal: Toilet Transfer - Progress: Goal set today Pt Will Perform Toileting - Clothing Manipulation: with supervision;Standing;Sitting on 3-in-1 or toilet ADL Goal: Toileting - Clothing Manipulation - Progress: Goal set today Pt Will Perform Toileting - Hygiene: with supervision;Sitting on 3-in-1 or toilet;Sit to stand from 3-in-1/toilet ADL Goal: Toileting - Hygiene - Progress: Goal set today  Visit Information  Last OT Received On: 06/08/12 Assistance Needed: +1    Subjective Data  Subjective: " I am going tp [facility] tomorrow for rehab " Patient Stated Goal: To return home after therapy at SNF   Prior Functioning     Home Living Lives With: Daughter Available Help at Discharge: Skilled Nursing Facility Type of Home: House Home Access: Stairs to  enter Secretary/administrator of Steps: 1 Entrance Stairs-Rails: Right;Left Home Layout: Multi-level Alternate Level Stairs-Number of Steps: pt does not use stairs, stays on 1st floor Bathroom Shower/Tub: Engineer, manufacturing systems: Standard Home Adaptive Equipment: None Additional Comments: pt's dtr  works during the day a dn will not be available to help her Prior Function Level of Independence: Independent Able to Take Stairs?: Yes Driving: Yes Vocation: Retired Musician: No difficulties Dominant Hand: Right         Vision/Perception Vision - Assessment Eye Alignment: Within Chemical engineer Perception: Within Functional Limits   Cognition  Overall Cognitive Status: Appears within functional limits for tasks assessed/performed Arousal/Alertness: Awake/alert Orientation Level: Appears intact for tasks assessed Behavior During Session: [facility] for tasks performed Cognition - Other Comments: pt very talkative and needs re-direction at times.      Extremity/Trunk Assessment Right Upper Extremity Assessment RUE ROM/Strength/Tone: Within functional levels Left Upper Extremity Assessment LUE ROM/Strength/Tone: Within functional levels     Mobility Bed Mobility Bed Mobility: Not assessed Transfers Transfers: Sit to Stand;Stand to Sit Sit to Stand: 4: Min guard Stand to Sit: 4: Min guard Details for Transfer Assistance: cues for UE use and to control descent with sitting.  pt required ModA with sitting and standing from toilet.       Shoulder Instructions        Balance Balance Balance Assessed: No   End of Session OT - End of Session Equipment Utilized During Treatment: Gait belt;Other (comment) (RW, 3 in 1, ADL A/E) Activity Tolerance: Patient tolerated treatment well Patient left: in chair;with call bell/phone within reach  GO     Galen Manila 06/08/2012, 11:57 AM

## 2012-06-08 NOTE — Plan of Care (Signed)
Problem: Phase II Progression Outcomes Goal: Discharge plan established Recommend SNF for further therapy needs after acute d/c     

## 2012-06-08 NOTE — Clinical Social Work Psychosocial (Signed)
Clinical Social Work Department  BRIEF PSYCHOSOCIAL ASSESSMENT  Patient: Rachael [name]  Account [id number] Admit date: 06/07/12  Clinical Social Worker:  Sabino Niemann, MSW  Date/Time: 06/08/12 Referred by: Physician Date Referred: 06/07/12   Referred for   SNF Placement   Other Referral:  Interview type: Patient  Other interview type:  PSYCHOSOCIAL DATA  Living Status: Daughter Admitted from facility:  Level of care:  Primary support name: Frederich Cha Primary support relationship to patient: Daughter Degree of support available:   Strong and vested   CURRENT CONCERNS   Current Concerns   Post-Acute Placement   Other Concerns:  SOCIAL WORK ASSESSMENT / PLAN   CSW met with pt re: PT recommendation for SNF.     CSW explained placement process and answered questions.   Pt reports she has pre-registered at [facility]  CSW completed FL2 completed andclinicals sent to [facility]      Assessment/plan status: Information/Referral to [facility]  Other assessment/ plan:  Information/referral to community resources:   SNF   PTAR   PATIENT'S/FAMILY'S RESPONSE TO PLAN OF CARE:   Pt Is agreeable to ST SNF in order to increase strength and independence with mobility prior to returning home. placement Pt verbalized understanding of placement process and appreciation for CSW assist.   Sabino Niemann, MSW  [phone]

## 2012-06-09 LAB — CBC
Hemoglobin: 11 g/dL — ABNORMAL LOW (ref 12.0–15.0)
MCH: 28.9 pg (ref 26.0–34.0)
Platelets: 270 10*3/uL (ref 150–400)
RBC: 3.81 MIL/uL — ABNORMAL LOW (ref 3.87–5.11)
WBC: 14.1 10*3/uL — ABNORMAL HIGH (ref 4.0–10.5)

## 2012-06-09 LAB — BASIC METABOLIC PANEL
CO2: 23 mEq/L (ref 19–32)
Calcium: 9.1 mg/dL (ref 8.4–10.5)
Glucose, Bld: 125 mg/dL — ABNORMAL HIGH (ref 70–99)
Potassium: 3.8 mEq/L (ref 3.5–5.1)
Sodium: 142 mEq/L (ref 135–145)

## 2012-06-09 MED ORDER — BISACODYL 10 MG RE SUPP
10.0000 mg | Freq: Once | RECTAL | Status: AC
  Administered 2012-06-09: 10 mg via RECTAL
  Filled 2012-06-09: qty 1

## 2012-06-09 NOTE — Progress Notes (Signed)
Quick Note:  I spoke with pt and sent copy of results to below doctor. ______

## 2012-06-09 NOTE — Progress Notes (Signed)
Patient ID: Rachael [name], female   DOB: 03-Jan-1931, 79 y.o.   MRN: [id number] PATIENT ID: Rachael [name]  MRN: [id number]  DOB/AGE:  Oct 10, 1930 / 79 y.o.  2 Days Post-Op Procedure(s) (LRB): TOTAL KNEE ARTHROPLASTY (Right)    PROGRESS NOTE Subjective: Patient is alert, oriented, no Nausea, no Vomiting, yes passing gas, no Bowel Movement. Taking PO yes. Denies SOB, Chest or Calf Pain. Using Incentive Spirometer, PAS in place. Ambulate well, CPM 0-60 Patient reports pain as 4 on 0-10 scale  .    Objective: Vital signs in last 24 hours: Filed Vitals:   06/08/12 2000 06/08/12 2331 06/09/12 0000 06/09/12 0400  BP:  110/55    Pulse:  75    Temp:  98.6 F (37 C)    TempSrc:      Resp: 18 20 18 18   SpO2: 93% 95% 94% 94%      Intake/Output from previous day: I/O last 3 completed shifts: In: 1457 [I.V.:1355; IV Piggyback:102] Out: 1300 [Urine:1200; Drains:100]   Intake/Output this shift:     LABORATORY DATA:  Basename 06/08/12 0540  WBC 10.2  HGB 11.1*  HCT 33.6*  PLT 251  NA 138  K 4.2  CL 106  CO2 24  BUN 12  CREATININE 0.75  GLUCOSE 143*  GLUCAP --  INR --  CALCIUM 8.7    Examination: ABD soft Neurovascular intact Sensation intact distally Intact pulses distally Dorsiflexion/Plantar flexion intact Incision: scant drainage}  Assessment:   2 Days Post-Op Procedure(s) (LRB): TOTAL KNEE ARTHROPLASTY (Right) ADDITIONAL DIAGNOSIS:  Acute Blood Loss Anemia Principal Problem:  *Right knee DJD Active Problems:  HYPOTHYROIDISM  HYPERLIPIDEMIA  ANEMIA-NOS  DEPRESSION  CORONARY ARTERY DISEASE  GERD  LOW BACK PAIN, CHRONIC  CHEST WALL PAIN, ANTERIOR  Supraventricular tachycardia  CAD (coronary artery disease)  Gout  Plan: PT/OT WBAT, CPM 5/hrs day until ROM 0-90 degrees, then D/C CPM DVT Prophylaxis:  SCDx72hrs, ASA 325 mg BID x 2 weeks DISCHARGE PLAN: Skilled Nursing Facility/Rehab DISCHARGE NEEDS: FL2 signed Rachael [name] suppository  Today and saline  lock IV   Dressing changed by me this am     Luzelena Heeg J 06/09/2012, 7:06 AM

## 2012-06-09 NOTE — Progress Notes (Signed)
Physical Therapy Treatment Patient Details Name: Rachael [name] MRN: [id number] DOB: 1931/02/06 Today's Date: 06/09/2012 Time: 4098-1191 PT Time Calculation (min): 17 min  PT Assessment / Plan / Recommendation Comments on Treatment Session  pt presents with R TKA.  pt inidactes more painful today and limits mobility due to pain.  pt states plan is to D/C to SNF tomorrow.  pt left sitting EOB with OT.      Follow Up Recommendations  SNF     Does the patient have the potential to tolerate intense rehabilitation     Barriers to Discharge        Equipment Recommendations  Rolling walker with 5" wheels    Recommendations for Other Services    Frequency 7X/week   Plan Discharge plan remains appropriate;Frequency remains appropriate    Precautions / Restrictions Precautions Precautions: Knee;Fall Precaution Comments: pt states she performed her SLR with the PA this am and states she doesn't need to wear KI anymore.   Restrictions Weight Bearing Restrictions: Yes RLE Weight Bearing: Weight bearing as tolerated   Pertinent Vitals/Pain Does not rate pain, but states it's worse today.      Mobility  Bed Mobility Bed Mobility: Not assessed (pt sitting EOB.  ) Transfers Transfers: Sit to Stand;Stand to Sit Sit to Stand: 4: Min guard;4: Min assist;With upper extremity assist;From bed;From toilet Stand to Sit: 4: Min guard;With upper extremity assist;To toilet;To bed Details for Transfer Assistance: cues for UE use.  MinA needed to stand from toilet with grab bar.   Ambulation/Gait Ambulation/Gait Assistance: 4: Min guard Ambulation Distance (Feet): 80 Feet Assistive device: Rolling walker Ambulation/Gait Assistance Details: pt notes too painful today to increase ambulation distance.  Cues for positioning in RW, upright posture.   Gait Pattern: Decreased step length - left;Decreased stance time - right;Trunk flexed Stairs: No Wheelchair Mobility Wheelchair Mobility: No      Exercises     PT Diagnosis:    PT Problem List:   PT Treatment Interventions:     PT Goals Acute Rehab PT Goals Time For Goal Achievement: 06/14/12 Potential to Achieve Goals: Good PT Goal: Sit to Stand - Progress: Progressing toward goal PT Goal: Stand to Sit - Progress: Progressing toward goal PT Goal: Ambulate - Progress: Progressing toward goal  Visit Information  Last PT Received On: 06/09/12 Assistance Needed: +1    Subjective Data  Subjective: I let the pain get bad last night and it's hurting worse today.     Cognition  Overall Cognitive Status: Appears within functional limits for tasks assessed/performed Arousal/Alertness: Awake/alert Orientation Level: Appears intact for tasks assessed Behavior During Session: [facility] for tasks performed Cognition - Other Comments: pt very talkative and needs re-direction at times.      Balance  Balance Balance Assessed: No  End of Session PT - End of Session Equipment Utilized During Treatment: Gait belt Activity Tolerance: Patient limited by pain Patient left: in bed;with call bell/phone within reach (Sitting EOB with OT.  ) Nurse Communication: Mobility status CPM Right Knee CPM Right Knee: Off   GP     Sunny Schlein, [place] [phone] 06/09/2012, 11:16 AM

## 2012-06-09 NOTE — Progress Notes (Signed)
Occupational Therapy Treatment Patient Details Name: Rachael [name] MRN: [id number] DOB: [birth year] Today's Date: 06/09/2012 Time: 4098-1191 OT Time Calculation (min): 32 min  OT Assessment / Plan / Recommendation Comments on Treatment Session Pt making progress and should continue with OT services to maximize level of function and safety    Follow Up Recommendations  SNF    Barriers to Discharge       Equipment Recommendations  Tub/shower bench    Recommendations for Other Services    Frequency Min 2X/week   Plan Discharge plan remains appropriate    Precautions / Restrictions Precautions Precautions: Knee;Fall Precaution Comments: pt states she performed her SLR with the PA this am and states she doesn't need to wear KI anymore.   Restrictions Weight Bearing Restrictions: Yes RLE Weight Bearing: Weight bearing as tolerated       ADL  Grooming: Supervision/safety;Performed;Wash/dry hands;Wash/dry face;Teeth care;Other (comment) (verbal cues for correct hand placenemt using RW ) Where Assessed - Grooming: Unsupported sitting Upper Body Bathing: Performed;Supervision/safety;Set up Where Assessed - Upper Body Bathing: Unsupported sitting Lower Body Bathing: Performed;Supervision/safety;Set up;Min guard Where Assessed - Lower Body Bathing: Supported sit to stand;Unsupported sitting Upper Body Dressing: Performed;Supervision/safety;Set up Where Assessed - Upper Body Dressing: Unsupported sitting Lower Body Dressing: Performed;Minimal assistance;Other (comment) (TED hose, using sock aid, reacher) Where Assessed - Lower Body Dressing: Unsupported sitting;Supported sit to stand Toilet Transfer: Performed;Supervision/safety;Other (comment);Min guard (verbal cues for safety using RW) Toilet Transfer Method: Other (comment) (ambulating from RW level) Toilet Transfer Equipment: Raised toilet seat with arms (or 3-in-1 over toilet) Toileting - Clothing Manipulation and Hygiene:  Performed;Min guard Where Assessed - Glass blower/designer Manipulation and Hygiene: Standing Equipment Used: Rolling walker;Sock aid;Reacher Transfers/Ambulation Related to ADLs: pt required verbal cues for safe technique to retrieve clothing items from closet while using RW    OT Diagnosis:    OT Problem List:   OT Treatment Interventions:     OT Goals ADL Goals ADL Goal: Grooming - Progress: Progressing toward goals ADL Goal: Lower Body Bathing - Progress: Progressing toward goals ADL Goal: Lower Body Dressing - Progress: Progressing toward goals ADL Goal: Toilet Transfer - Progress: Progressing toward goals ADL Goal: Toileting - Clothing Manipulation - Progress: Progressing toward goals ADL Goal: Toileting - Hygiene - Progress: Progressing toward goals  Visit Information  Last OT Received On: 06/09/12     Subjective Data  Subjective: " I won;t be leaving til tomorrow to go to [facility] " Patient Stated Goal: To return home after SNF rehab stay   Prior Functioning       Cognition  Overall Cognitive Status: Appears within functional limits for tasks assessed/performed Arousal/Alertness: Awake/alert Orientation Level: Appears intact for tasks assessed Behavior During Session: [facility] for tasks performed Cognition - Other Comments: pt very talkative and needs re-direction at times.      Mobility  Shoulder Instructions Bed Mobility Bed Mobility: Not assessed (pt sitting EOB.  ) Transfers Transfers: Sit to Stand;Stand to Sit Sit to Stand: 5: Supervision;4: Min guard Stand to Sit: 4: Min guard;5: Supervision Details for Transfer Assistance: cues for UE use.  MinA needed to stand from toilet with grab bar.         Exercises      Balance Balance Balance Assessed: No   End of Session OT - End of Session Equipment Utilized During Treatment: Other (comment) (RW, 3 in 1, reacher, sock aid) Activity Tolerance: Patient tolerated treatment well Patient left: in bed;with call  bell/phone within reach  GO     Margaretmary Eddy [facility] 06/09/2012, 1:16 PM

## 2012-06-10 DIAGNOSIS — M545 Low back pain: Secondary | ICD-10-CM | POA: Diagnosis not present

## 2012-06-10 DIAGNOSIS — R000 Tachycardia, unspecified: Secondary | ICD-10-CM | POA: Diagnosis not present

## 2012-06-10 DIAGNOSIS — I2510 Atherosclerotic heart disease of native coronary artery without angina pectoris: Secondary | ICD-10-CM | POA: Diagnosis not present

## 2012-06-10 DIAGNOSIS — R269 Unspecified abnormalities of gait and mobility: Secondary | ICD-10-CM | POA: Diagnosis not present

## 2012-06-10 DIAGNOSIS — K219 Gastro-esophageal reflux disease without esophagitis: Secondary | ICD-10-CM | POA: Diagnosis not present

## 2012-06-10 DIAGNOSIS — M1990 Unspecified osteoarthritis, unspecified site: Secondary | ICD-10-CM | POA: Diagnosis not present

## 2012-06-10 DIAGNOSIS — Z471 Aftercare following joint replacement surgery: Secondary | ICD-10-CM | POA: Diagnosis not present

## 2012-06-10 DIAGNOSIS — Z96659 Presence of unspecified artificial knee joint: Secondary | ICD-10-CM | POA: Diagnosis not present

## 2012-06-10 DIAGNOSIS — M6281 Muscle weakness (generalized): Secondary | ICD-10-CM | POA: Diagnosis not present

## 2012-06-10 DIAGNOSIS — M109 Gout, unspecified: Secondary | ICD-10-CM | POA: Diagnosis not present

## 2012-06-10 DIAGNOSIS — E039 Hypothyroidism, unspecified: Secondary | ICD-10-CM | POA: Diagnosis not present

## 2012-06-10 LAB — CBC
Hemoglobin: 11.8 g/dL — ABNORMAL LOW (ref 12.0–15.0)
MCH: 28.7 pg (ref 26.0–34.0)
MCV: 88.3 fL (ref 78.0–100.0)
Platelets: 269 10*3/uL (ref 150–400)
RBC: 4.11 MIL/uL (ref 3.87–5.11)
WBC: 11.4 10*3/uL — ABNORMAL HIGH (ref 4.0–10.5)

## 2012-06-10 LAB — BASIC METABOLIC PANEL
CO2: 24 mEq/L (ref 19–32)
Chloride: 105 mEq/L (ref 96–112)
Creatinine, Ser: 0.81 mg/dL (ref 0.50–1.10)
Glucose, Bld: 98 mg/dL (ref 70–99)
Sodium: 142 mEq/L (ref 135–145)

## 2012-06-10 MED ORDER — DSS 100 MG PO CAPS: ORAL_CAPSULE | ORAL | Status: DC

## 2012-06-10 MED ORDER — RIVAROXABAN 10 MG PO TABS: 10.0000 mg | ORAL_TABLET | Freq: Every day | ORAL | Status: DC

## 2012-06-10 MED ORDER — OXYCODONE HCL 5 MG PO TABS: ORAL_TABLET | ORAL | Status: DC

## 2012-06-10 MED ORDER — BISACODYL 5 MG PO TBEC: DELAYED_RELEASE_TABLET | ORAL | Status: DC

## 2012-06-10 NOTE — Discharge Summary (Signed)
Patient ID: Rachael [name] MRN: [id number] DOB/AGE: [birth year] 79 y.o.  Admit date: 06/07/2012 Discharge date: 06/10/2012  Admission Diagnoses:  Principal Problem:  *Right knee DJD Active Problems:  HYPOTHYROIDISM  HYPERLIPIDEMIA  ANEMIA-NOS  DEPRESSION  CORONARY ARTERY DISEASE  GERD  LOW BACK PAIN, CHRONIC  CHEST WALL PAIN, ANTERIOR  Supraventricular tachycardia  CAD (coronary artery disease)  Gout   Discharge Diagnoses:  Same  Past Medical History  Diagnosis Date  . GERD (gastroesophageal reflux disease)   . Gout     has seen Dr. Alben Deeds  . CAD (coronary artery disease)     sees Dr. Antoine Poche, normal Myoview stress test 02-26-12   . Hypothyroidism   . Hyperglycemia   . Depression   . Anemia   . Diverticulitis of colon   . Arthritis   . Right knee DJD   . H/O hiatal hernia   . Supraventricular tachycardia 04/07/12    In Dr Sherene Sires office - wore heart monitor for 3 weeks   . Shortness of breath     with anxiety attacks  . Anxiety   . Lightheadedness     Surgeries: Procedure(s): TOTAL KNEE ARTHROPLASTY on 06/07/2012   Consultants:    Discharged Condition: Improved  Hospital Course: Rachael [name] is an 79 y.o. female who was admitted 06/07/2012 for operative treatment ofRight knee DJD. Patient has severe unremitting pain that affects sleep, daily activities, and work/hobbies. After pre-op clearance the patient was taken to the operating room on 06/07/2012 and underwent  Procedure(s): TOTAL KNEE ARTHROPLASTY.    Patient was given perioperative antibiotics: Anti-infectives     Start     Dose/Rate Route Frequency Ordered Stop   06/07/12 1530   ceFAZolin (ANCEF) IVPB 2 g/50 mL premix        2 g 100 mL/hr over 30 Minutes Intravenous Every 6 hours 06/07/12 1339 06/07/12 2248   06/07/12 1054   cefUROXime (ZINACEF) injection  Status:  Discontinued          As needed 06/07/12 1055 06/07/12 1133   06/07/12 0807   ceFAZolin (ANCEF) 2-3 GM-% IVPB SOLR       Comments: Kathrene Bongo: cabinet override         06/07/12 0807 06/07/12 0930   06/06/12 0952   ceFAZolin (ANCEF) IVPB 2 g/50 mL premix  Status:  Discontinued        2 g 100 mL/hr over 30 Minutes Intravenous On call to O.R. 06/06/12 4098 06/07/12 1339           Patient was given sequential compression devices, early ambulation, and chemoprophylaxis to prevent DVT.  Patient benefited maximally from hospital stay and there were no complications.    Recent vital signs: Patient Vitals for the past 24 hrs:  BP Temp Pulse Resp SpO2  06/10/12 0600 115/66 mmHg 97.9 F (36.6 C) 93  18  96 %  06/10/12 0400 - - - 18  96 %  06/10/12 0000 - - - 18  96 %  07/04/2012 2106 145/70 mmHg 98.3 F (36.8 C) 90  18  96 %  2012-07-04 2000 - - - 18  96 %  07-04-12 1200 - - - 18  99 %     Recent laboratory studies:  [facility] 06/10/12 0550 07-04-12 0805  WBC 11.4* 14.1*  HGB 11.8* 11.0*  HCT 36.3 34.0*  PLT 269 270  NA 142 142  K 4.2 3.8  CL 105 107  CO2 24 23  BUN 14 19  CREATININE 0.81 0.87  GLUCOSE 98 125*  INR -- --  CALCIUM 9.0 --     Discharge Medications:     Medication List     As of 06/10/2012  8:06 AM    STOP taking these medications         ibuprofen 800 MG tablet   Commonly known as: ADVIL,MOTRIN      TAKE these medications         allopurinol 100 MG tablet   Commonly known as: ZYLOPRIM   Take 1 tablet (100 mg total) by mouth daily.      bisacodyl 5 MG EC tablet   Commonly known as: DULCOLAX   Take 2 tablets every night with dinner until bowel movement.  LAXITIVE.  Restart if two days since last bowel movement      DSS 100 MG Caps   1 tab 2 times a day while on narcotics.  STOOL SOFTENER      levothyroxine 100 MCG tablet   Commonly known as: SYNTHROID, LEVOTHROID   Take 100 mcg by mouth daily.      nitroGLYCERIN 0.3 MG SL tablet   Commonly known as: NITROSTAT   Place 1 tablet (0.3 mg total) under the tongue every 5 (five) minutes as needed for chest  pain.      oxyCODONE 5 MG immediate release tablet   Commonly known as: Oxy IR/ROXICODONE   1-2 tablets every 4-6 hrs as needed for pain      pantoprazole 20 MG tablet   Commonly known as: PROTONIX   Take 1 tablet (20 mg total) by mouth daily.      RANITIDINE ACID REDUCER PO   Take 150 mg by mouth daily.      rivaroxaban 10 MG Tabs tablet   Commonly known as: XARELTO   Take 1 tablet (10 mg total) by mouth daily with breakfast.      VICODIN HP 10-660 MG Tabs   Generic drug: Hydrocodone-Acetaminophen   Take 1 tablet by mouth every 6 (six) hours as needed. pain        Diagnostic Studies: Dg Chest 2 View  06/03/2012  *RADIOLOGY REPORT*  Clinical Data: Preop for knee surgery  CHEST - 2 VIEW  Comparison: Chest x-ray of 09/23/2010  Findings: No active infiltrate or effusion is seen. A probable small granuloma in the periphery of the right mid lung is stable. The heart is mildly enlarged and stable.  A small hiatal hernia is present.  No bony abnormality is seen.  Calcified right mediastinal nodes are present secondary to prior granulomatous disease.  IMPRESSION: No active lung disease.  Changes of prior granulomatous disease. Small hiatal hernia.   Original Report Authenticated By: Dwyane Dee, M.D.     Disposition: 01-Home or Self Care      Discharge Orders    Future Appointments: Provider: Department: Dept Phone: Center:   06/24/2012 10:30 AM Valarie Merino, MD [facility], [place] [phone] None     Future Orders Please Complete By Expires   Diet - low sodium heart healthy      Call MD / Call 911      Comments:   If you experience chest pain or shortness of breath, CALL 911 and be transported to the hospital emergency room.  If you develope a fever above 101 F, pus (white drainage) or increased drainage or redness at the wound, or calf pain, call your surgeon's office.   Discharge instructions      Comments:  Total Knee Replacement Care After Refer to this sheet in  the next few weeks. These discharge instructions provide you with general information on caring for yourself after you leave the hospital. Your caregiver may also give you specific instructions. Your treatment has been planned according to the most current medical practices available, but unavoidable complications sometimes occur. If you have any problems or questions after discharge, please call your caregiver. Regaining a near full range of motion of your knee within the first 3 to 6 weeks after surgery is critical. HOME CARE INSTRUCTIONS  You may resume a normal diet and activities as directed.  Perform exercises as directed.  Place yellow foam block, yellow side up under heel at all times except when in CPM or when walking.  DO NOT modify, tear, cut, or change in any way. You will receive physical therapy daily  Take showers instead of baths until informed otherwise.  Change bandages (dressings)daily Do not take over-the-counter or prescription medicines for pain, discomfort, or fever. Eat a well-balanced diet.  Avoid lifting or driving until you are instructed otherwise.  Make an appointment to see your caregiver for stitches (suture) or staple removal as directed.  If you have been sent home with a continuous passive motion machine (CPM machine), 0-90 degrees 6 hrs a day   2 hrs a shift SEEK MEDICAL CARE IF: You have swelling of your calf or leg.  You develop shortness of breath or chest pain.  You have redness, swelling, or increasing pain in the wound.  There is pus or any unusual drainage coming from the surgical site.  You notice a bad smell coming from the surgical site or dressing.  The surgical site breaks open after sutures or staples have been removed.  There is persistent bleeding from the suture or staple line.  You are getting worse or are not improving.  You have any other questions or concerns.  SEEK IMMEDIATE MEDICAL CARE IF:  You have a fever.  You develop a rash.  You  have difficulty breathing.  You develop any reaction or side effects to medicines given.  Your knee motion is decreasing rather than improving.  MAKE SURE YOU:  Understand these instructions.  Will watch your condition.  Will get help right away if you are not doing well or get worse.   Constipation Prevention      Comments:   Drink plenty of fluids.  Prune juice may be helpful.  You may use a stool softener, such as Colace (over the counter) 100 mg twice a day.  Use MiraLax (over the counter) for constipation as needed.   Increase activity slowly as tolerated      CPM      Comments:   Continuous passive motion machine (CPM):      Use the CPM from 0 to 90 for 6 hours per day.       You may break it up into 2 or 3 sessions per day.      Use CPM for 2 weeks or until you are told to stop.   TED hose      Comments:   Use stockings (TED hose) for 2 weeks on both leg(s).  You may remove them at night for sleeping.   Change dressing      Comments:   Change the dressing daily with sterile 4 x 4 inch gauze dressing and apply TED hose.  You may clean the incision with alcohol prior to redressing.   Do  not put a pillow under the knee. Place it under the heel.      Comments:   Place yellow foam block, yellow side up under heel at all times except when in CPM or when walking.  DO NOT modify, tear, cut, or change in any way the yellow foam block.      Follow-up Information    Follow up with Nilda Simmer, MD. On 06/21/2012. (appt 3pm)    Contact information:   [address] [place] [address] [phone]           Signed: Pascal Lux 06/10/2012, 8:06 AM

## 2012-06-16 DIAGNOSIS — E039 Hypothyroidism, unspecified: Secondary | ICD-10-CM | POA: Diagnosis not present

## 2012-06-16 DIAGNOSIS — I2510 Atherosclerotic heart disease of native coronary artery without angina pectoris: Secondary | ICD-10-CM | POA: Diagnosis not present

## 2012-06-16 DIAGNOSIS — K219 Gastro-esophageal reflux disease without esophagitis: Secondary | ICD-10-CM | POA: Diagnosis not present

## 2012-06-17 ENCOUNTER — Encounter (INDEPENDENT_AMBULATORY_CARE_PROVIDER_SITE_OTHER): Payer: Medicare Other | Admitting: Surgery

## 2012-06-18 ENCOUNTER — Telehealth: Payer: Self-pay | Admitting: Cardiology

## 2012-06-18 NOTE — Telephone Encounter (Signed)
New problem:    need to speak with Lawson Fiscal  Regarding monitor.

## 2012-06-18 NOTE — Telephone Encounter (Signed)
Advised patient. Patient states she is still having fast heart rate after eating and she almost had to go back to the emergency room recently because of this. She wants to try to get to the bottom of what is going on.  Will forward to [facility] RN to see if she can get her in to see Dr Antoine Poche soon.

## 2012-06-18 NOTE — Telephone Encounter (Signed)
Her monitor was basically ok. No significant arrhythmia.

## 2012-06-21 NOTE — Telephone Encounter (Signed)
Will forward to MD to see if there is any other testing to be done

## 2012-06-21 NOTE — Telephone Encounter (Signed)
If this is happening everyday then we could apply a 48 hour Holter.  Event monitor was not helpful.

## 2012-06-21 NOTE — Telephone Encounter (Signed)
Pt states this may occur up to 3 days a week but it depends on her stress level and level of excitment.  She can not be sure of when it will happen but does know it only seems to last appr. 5 mins.  Aware I will forward this inform to Dr Antoine Poche for review and further orders

## 2012-06-24 ENCOUNTER — Encounter (INDEPENDENT_AMBULATORY_CARE_PROVIDER_SITE_OTHER): Payer: Self-pay | Admitting: Surgery

## 2012-06-25 NOTE — Telephone Encounter (Signed)
We can add her on with one of the APPs if she wants.

## 2012-06-28 NOTE — Telephone Encounter (Signed)
Spoke with pt, aware dr hochrein would like her to see the PA or NP. Offered pt an appt this Friday when dr hochrein is in the office but she refused. She is leaving and going home today and will call us back to schedule once she gets settled at home.

## 2012-06-30 DIAGNOSIS — Z471 Aftercare following joint replacement surgery: Secondary | ICD-10-CM | POA: Diagnosis not present

## 2012-06-30 DIAGNOSIS — I472 Ventricular tachycardia: Secondary | ICD-10-CM | POA: Diagnosis not present

## 2012-06-30 DIAGNOSIS — F329 Major depressive disorder, single episode, unspecified: Secondary | ICD-10-CM | POA: Diagnosis not present

## 2012-06-30 DIAGNOSIS — G8928 Other chronic postprocedural pain: Secondary | ICD-10-CM | POA: Diagnosis not present

## 2012-06-30 DIAGNOSIS — I2510 Atherosclerotic heart disease of native coronary artery without angina pectoris: Secondary | ICD-10-CM | POA: Diagnosis not present

## 2012-06-30 DIAGNOSIS — Z96659 Presence of unspecified artificial knee joint: Secondary | ICD-10-CM | POA: Diagnosis not present

## 2012-07-02 ENCOUNTER — Encounter: Payer: Medicare Other | Admitting: Nurse Practitioner

## 2012-07-02 ENCOUNTER — Encounter: Payer: Medicare Other | Admitting: Physician Assistant

## 2012-07-02 DIAGNOSIS — I472 Ventricular tachycardia: Secondary | ICD-10-CM | POA: Diagnosis not present

## 2012-07-02 DIAGNOSIS — F329 Major depressive disorder, single episode, unspecified: Secondary | ICD-10-CM | POA: Diagnosis not present

## 2012-07-02 DIAGNOSIS — Z471 Aftercare following joint replacement surgery: Secondary | ICD-10-CM | POA: Diagnosis not present

## 2012-07-02 DIAGNOSIS — G8928 Other chronic postprocedural pain: Secondary | ICD-10-CM | POA: Diagnosis not present

## 2012-07-02 DIAGNOSIS — Z96659 Presence of unspecified artificial knee joint: Secondary | ICD-10-CM | POA: Diagnosis not present

## 2012-07-02 DIAGNOSIS — I2510 Atherosclerotic heart disease of native coronary artery without angina pectoris: Secondary | ICD-10-CM | POA: Diagnosis not present

## 2012-07-05 ENCOUNTER — Telehealth: Payer: Self-pay | Admitting: *Deleted

## 2012-07-05 DIAGNOSIS — F329 Major depressive disorder, single episode, unspecified: Secondary | ICD-10-CM | POA: Diagnosis not present

## 2012-07-05 DIAGNOSIS — I472 Ventricular tachycardia: Secondary | ICD-10-CM | POA: Diagnosis not present

## 2012-07-05 DIAGNOSIS — I2510 Atherosclerotic heart disease of native coronary artery without angina pectoris: Secondary | ICD-10-CM | POA: Diagnosis not present

## 2012-07-05 DIAGNOSIS — Z96659 Presence of unspecified artificial knee joint: Secondary | ICD-10-CM | POA: Diagnosis not present

## 2012-07-05 DIAGNOSIS — G8928 Other chronic postprocedural pain: Secondary | ICD-10-CM | POA: Diagnosis not present

## 2012-07-05 DIAGNOSIS — Z471 Aftercare following joint replacement surgery: Secondary | ICD-10-CM | POA: Diagnosis not present

## 2012-07-05 NOTE — Telephone Encounter (Signed)
Reviewed with Dr Antoine Poche - being that there is no documentation of At Fib or Flutter it is inappropriate for the pt to remain on Xarelto any longer than recommendations for DVT prophasics s/p hip surgery.  Rachael [name] is aware.

## 2012-07-05 NOTE — Telephone Encounter (Signed)
PA with [facility] needing to know if pt needs to stay on Xarelto.  She was started on it for DVT s/p hip surgery.  Pt has had two episodes with rapid HR one prior to surgery and one after(possible SVT) but they are asking if she should be treated as A Fib since the episodes are not documented.

## 2012-07-06 DIAGNOSIS — Z471 Aftercare following joint replacement surgery: Secondary | ICD-10-CM | POA: Diagnosis not present

## 2012-07-06 DIAGNOSIS — Z96659 Presence of unspecified artificial knee joint: Secondary | ICD-10-CM | POA: Diagnosis not present

## 2012-07-06 DIAGNOSIS — G8928 Other chronic postprocedural pain: Secondary | ICD-10-CM | POA: Diagnosis not present

## 2012-07-06 DIAGNOSIS — I2510 Atherosclerotic heart disease of native coronary artery without angina pectoris: Secondary | ICD-10-CM | POA: Diagnosis not present

## 2012-07-06 DIAGNOSIS — F329 Major depressive disorder, single episode, unspecified: Secondary | ICD-10-CM | POA: Diagnosis not present

## 2012-07-06 DIAGNOSIS — I472 Ventricular tachycardia: Secondary | ICD-10-CM | POA: Diagnosis not present

## 2012-07-07 ENCOUNTER — Other Ambulatory Visit: Payer: Self-pay | Admitting: Family Medicine

## 2012-07-07 NOTE — Telephone Encounter (Signed)
Cancel the 10/660 and call in Vicodin 10/325 to take q 6 hours prn pain, #120 with 5 rf

## 2012-07-12 DIAGNOSIS — I2510 Atherosclerotic heart disease of native coronary artery without angina pectoris: Secondary | ICD-10-CM | POA: Diagnosis not present

## 2012-07-12 DIAGNOSIS — Z471 Aftercare following joint replacement surgery: Secondary | ICD-10-CM | POA: Diagnosis not present

## 2012-07-12 DIAGNOSIS — I472 Ventricular tachycardia: Secondary | ICD-10-CM | POA: Diagnosis not present

## 2012-07-12 DIAGNOSIS — G8928 Other chronic postprocedural pain: Secondary | ICD-10-CM | POA: Diagnosis not present

## 2012-07-12 DIAGNOSIS — F329 Major depressive disorder, single episode, unspecified: Secondary | ICD-10-CM | POA: Diagnosis not present

## 2012-07-12 DIAGNOSIS — Z96659 Presence of unspecified artificial knee joint: Secondary | ICD-10-CM | POA: Diagnosis not present

## 2012-07-13 ENCOUNTER — Ambulatory Visit (INDEPENDENT_AMBULATORY_CARE_PROVIDER_SITE_OTHER): Payer: Medicare Other | Admitting: Cardiology

## 2012-07-13 ENCOUNTER — Encounter: Payer: Self-pay | Admitting: Cardiology

## 2012-07-13 VITALS — BP 115/60 | HR 86 | Ht 61.0 in | Wt 153.1 lb

## 2012-07-13 DIAGNOSIS — I2510 Atherosclerotic heart disease of native coronary artery without angina pectoris: Secondary | ICD-10-CM

## 2012-07-13 DIAGNOSIS — I498 Other specified cardiac arrhythmias: Secondary | ICD-10-CM

## 2012-07-13 DIAGNOSIS — I6529 Occlusion and stenosis of unspecified carotid artery: Secondary | ICD-10-CM | POA: Diagnosis not present

## 2012-07-13 DIAGNOSIS — I471 Supraventricular tachycardia: Secondary | ICD-10-CM

## 2012-07-13 NOTE — Patient Instructions (Addendum)
The current medical regimen is effective;  continue present plan and medications.  If possible, please have blood pressure and/or blood sugar checked when you have these episodes.  Follow up in 6 months with Dr Antoine Poche.  You will receive a letter in the mail 2 months before you are due.  Please call us when you receive this letter to schedule your follow up appointment.

## 2012-07-13 NOTE — Progress Notes (Signed)
HPI The patient presents for followup of palpitations, presyncope. The patient this has now an extensive workup. This included an event monitor. She was given a monitor and did have palpitations and presyncope. However, when she activated her device describing irregular beats, rapid beats or lightheadedness she was in sinus rhythm. She's not describing any new chest pressure, neck or arm discomfort. She's had no weight gain or edema. Of note she does describe some anxiety.  Allergies  Allergen Reactions  . Meperidine Hcl Other (See Comments)    Unknown   . Sulfonamide Derivatives Other (See Comments)    Happened about 15 years ago    Current Outpatient Prescriptions  Medication Sig Dispense Refill  . allopurinol (ZYLOPRIM) 100 MG tablet Take 1 tablet (100 mg total) by mouth daily.  30 tablet  11  . bisacodyl (DULCOLAX) 5 MG EC tablet Take 2 tablets every night with dinner until bowel movement.  LAXITIVE.  Restart if two days since last bowel movement  30 tablet    . docusate sodium 100 MG CAPS 1 tab 2 times a day while on narcotics.  STOOL SOFTENER  10 capsule    . Hydrocodone-Acetaminophen (VICODIN HP) 10-660 MG TABS Take 1 tablet by mouth every 6 (six) hours as needed. pain      . levothyroxine (SYNTHROID, LEVOTHROID) 100 MCG tablet Take 100 mcg by mouth daily.      . nitroGLYCERIN (NITROSTAT) 0.3 MG SL tablet Place 1 tablet (0.3 mg total) under the tongue every 5 (five) minutes as needed for chest pain.  50 tablet  11  . oxyCODONE (OXY IR/ROXICODONE) 5 MG immediate release tablet 1-2 tablets every 4-6 hrs as needed for pain  60 tablet  0  . pantoprazole (PROTONIX) 20 MG tablet Take 1 tablet (20 mg total) by mouth daily.  60 tablet  3  . Ranitidine HCl (RANITIDINE ACID REDUCER PO) Take 150 mg by mouth daily.      . rivaroxaban (XARELTO) 10 MG TABS tablet Take 1 tablet (10 mg total) by mouth daily with breakfast.  12 tablet  0   No current facility-administered medications for this  visit.    Past Medical History  Diagnosis Date  . GERD (gastroesophageal reflux disease)   . Gout     has seen Dr. Alben Deeds  . CAD (coronary artery disease)     sees Dr. Antoine Poche, normal Myoview stress test 02-26-12   . Hypothyroidism   . Hyperglycemia   . Depression   . Anemia   . Diverticulitis of colon   . Arthritis   . Right knee DJD   . H/O hiatal hernia   . Supraventricular tachycardia 04/07/12    In Dr Sherene Sires office - wore heart monitor for 3 weeks   . Shortness of breath     with anxiety attacks  . Anxiety   . Lightheadedness     Past Surgical History  Procedure Laterality Date  . Hiatal hernia repair  09-21-10    per Dr. Luretha Murphy, lap [employer]   . Hernia repair    . Abdominal hysterectomy    . Total knee arthroplasty  06/07/2012    Procedure: TOTAL KNEE ARTHROPLASTY;  Surgeon: Nilda Simmer, MD;  Location: [facility] OR;  Service: Orthopedics;  Laterality: Right;    ROS:  Weight loss with loss of appetite. Otherwise as stated in the HPI and negative for all other systems.  PHYSICAL EXAM BP 115/60  Pulse 86  Ht 5\' 1"  (1.549 m)  Wt 153 lb 1.9 oz (69.455 kg)  BMI 28.95 kg/m2 GENERAL:  Well appearing HEENT:  Pupils equal round and reactive, fundi not visualized, oral mucosa unremarkable NECK:  No jugular venous distention, waveform within normal limits, carotid upstroke brisk and symmetric, no bruits, no thyromegaly LYMPHATICS:  No cervical, inguinal adenopathy LUNGS:  Clear to auscultation bilaterally BACK:  No CVA tenderness CHEST:  Unremarkable HEART:  PMI not displaced or sustained,S1 and S2 within normal limits, no S3, no S4, no clicks, no rubs, no murmurs ABD:  Flat, positive bowel sounds normal in frequency in pitch, no bruits, no rebound, no guarding, no midline pulsatile mass, no hepatomegaly, no splenomegaly EXT:  2 plus pulses throughout, no edema, no cyanosis no clubbing, right knee swelling   ASSESSMENT AND PLAN  DIZZINESS The etiology of  these episodes is not clear. I could not document a dysrhythmia. This was despite the fact she felt palpitations and dizziness while wearing her event monitor. Each time it was sinus rhythm. It would be possible she should get a blood pressure checked or blood sugar checked when she is having these events. There also could be an element of anxiety. Her daughter endorses the fact that her mother is very anxious.   CORONARY ARTERY DISEASE She had a negative stress perfusion study. No further cardiovascular testing is suggested.   CAROTID ARTERY DISEASE  She had mild nonobstructive plaque to her recent workup. We'll follow this again in about one year.

## 2012-07-14 ENCOUNTER — Telehealth: Payer: Self-pay | Admitting: Cardiology

## 2012-07-14 DIAGNOSIS — G8928 Other chronic postprocedural pain: Secondary | ICD-10-CM | POA: Diagnosis not present

## 2012-07-14 DIAGNOSIS — I472 Ventricular tachycardia: Secondary | ICD-10-CM | POA: Diagnosis not present

## 2012-07-14 NOTE — Telephone Encounter (Signed)
They need a order for home monitoring equipment and it needs to state specifically what Dr. Antoine Poche wants monitored

## 2012-07-15 ENCOUNTER — Encounter: Payer: Self-pay | Admitting: *Deleted

## 2012-07-15 NOTE — Telephone Encounter (Signed)
Spoke with [facility] who will be seeing pt atleast thru March - she needs an order stating pt needs telemonitoring including BP, HR and oximetry d/t dizziness and rapid HR.  Should be faxed to [phone].  This will be faxed today

## 2012-07-16 ENCOUNTER — Ambulatory Visit: Payer: Medicare Other | Admitting: Family Medicine

## 2012-07-19 ENCOUNTER — Ambulatory Visit (INDEPENDENT_AMBULATORY_CARE_PROVIDER_SITE_OTHER): Payer: Medicare Other | Admitting: Family Medicine

## 2012-07-19 ENCOUNTER — Encounter: Payer: Self-pay | Admitting: Family Medicine

## 2012-07-19 VITALS — BP 122/70 | HR 135 | Temp 98.5°F | Wt 148.0 lb

## 2012-07-19 DIAGNOSIS — F411 Generalized anxiety disorder: Secondary | ICD-10-CM

## 2012-07-19 LAB — CBC WITH DIFFERENTIAL/PLATELET
Basophils Absolute: 0.1 10*3/uL (ref 0.0–0.1)
Basophils Relative: 0.7 % (ref 0.0–3.0)
Eosinophils Absolute: 0.2 10*3/uL (ref 0.0–0.7)
Eosinophils Relative: 2.2 % (ref 0.0–5.0)
HCT: 44.8 % (ref 36.0–46.0)
Hemoglobin: 14.6 g/dL (ref 12.0–15.0)
MCV: 87.1 fl (ref 78.0–100.0)
Neutro Abs: 6 10*3/uL (ref 1.4–7.7)
Neutrophils Relative %: 61 % (ref 43.0–77.0)
RBC: 5.14 Mil/uL — ABNORMAL HIGH (ref 3.87–5.11)
WBC: 9.8 10*3/uL (ref 4.5–10.5)

## 2012-07-19 LAB — BASIC METABOLIC PANEL
BUN: 15 mg/dL (ref 6–23)
CO2: 25 mEq/L (ref 19–32)
Glucose, Bld: 112 mg/dL — ABNORMAL HIGH (ref 70–99)
Potassium: 4.5 mEq/L (ref 3.5–5.1)
Sodium: 141 mEq/L (ref 135–145)

## 2012-07-19 MED ORDER — LORAZEPAM 0.5 MG PO TABS: 0.5000 mg | ORAL_TABLET | Freq: Three times a day (TID) | ORAL | Status: DC | PRN

## 2012-07-19 NOTE — Progress Notes (Signed)
  Subjective:    Patient ID: Rachael [name], female    DOB: 04-30-1931, 79 y.o.   MRN: [id number]  HPI Here to ask about spells of rapid heartbeats. These have been worked up by Cardiology with a monitor, etc. No cardiologic cause has been found. Dr. Antoine Poche felt these may result from anxiety and I agree. Dot agrees that she gets very anxious at times. She asks for some labs to be done today. Her knee surgery went very well.    Review of Systems  Constitutional: Negative.   Respiratory: Negative.   Cardiovascular: Positive for palpitations. Negative for chest pain and leg swelling.  Psychiatric/Behavioral: Negative for confusion, dysphoric mood and agitation. The patient is nervous/anxious.        Objective:   Physical Exam  Constitutional: She appears well-developed and well-nourished.  Cardiovascular: Normal rate, regular rhythm, normal heart sounds and intact distal pulses.   Pulmonary/Chest: Effort normal and breath sounds normal.  Psychiatric: Her behavior is normal. Thought content normal.  very anxious           Assessment & Plan:  It seems her anxiety gets the best of her at times. Check labs today. Try Lorazepam prn.

## 2012-07-20 NOTE — Progress Notes (Signed)
Quick Note:  I spoke with pt ______ 

## 2012-07-21 ENCOUNTER — Other Ambulatory Visit: Payer: Self-pay | Admitting: Family Medicine

## 2012-07-21 DIAGNOSIS — F329 Major depressive disorder, single episode, unspecified: Secondary | ICD-10-CM | POA: Diagnosis not present

## 2012-07-28 ENCOUNTER — Encounter: Payer: Self-pay | Admitting: Family Medicine

## 2012-07-28 ENCOUNTER — Ambulatory Visit (INDEPENDENT_AMBULATORY_CARE_PROVIDER_SITE_OTHER): Payer: Medicare Other | Admitting: Family Medicine

## 2012-07-28 ENCOUNTER — Telehealth: Payer: Self-pay | Admitting: Family Medicine

## 2012-07-28 VITALS — BP 118/78 | HR 87 | Temp 98.2°F | Wt 149.0 lb

## 2012-07-28 DIAGNOSIS — R350 Frequency of micturition: Secondary | ICD-10-CM

## 2012-07-28 DIAGNOSIS — K5732 Diverticulitis of large intestine without perforation or abscess without bleeding: Secondary | ICD-10-CM

## 2012-07-28 DIAGNOSIS — B029 Zoster without complications: Secondary | ICD-10-CM

## 2012-07-28 LAB — POCT URINALYSIS DIPSTICK
Bilirubin, UA: NEGATIVE
Glucose, UA: NEGATIVE
Leukocytes, UA: NEGATIVE
Nitrite, UA: NEGATIVE
Urobilinogen, UA: 0.2
pH, UA: 6

## 2012-07-28 MED ORDER — ACYCLOVIR 800 MG PO TABS: 800.0000 mg | ORAL_TABLET | Freq: Three times a day (TID) | ORAL | Status: DC

## 2012-07-28 MED ORDER — CIPROFLOXACIN HCL 500 MG PO TABS: 500.0000 mg | ORAL_TABLET | Freq: Two times a day (BID) | ORAL | Status: DC

## 2012-07-28 MED ORDER — VALACYCLOVIR HCL 1 G PO TABS: 1000.0000 mg | ORAL_TABLET | Freq: Three times a day (TID) | ORAL | Status: DC

## 2012-07-28 NOTE — Telephone Encounter (Signed)
Pt is stating the Valtrex is $170.00. Pt wants to know was that name brand, or is that a generic? If so, pt needs an alternate drug that is less expensive. Pharm/ [facility]/ [address]

## 2012-07-28 NOTE — Telephone Encounter (Signed)
Change this to Acyclovir 800 mg tid for 10 days

## 2012-07-28 NOTE — Telephone Encounter (Signed)
I sent new script e-scribe and spoke with pt. 

## 2012-07-28 NOTE — Progress Notes (Signed)
  Subjective:    Patient ID: Rachael [name], female    DOB: 05-Dec-1930, 79 y.o.   MRN: [id number]  HPI Here for several reasons. First about 2 weeks ago she noticed some lower abdominal pains that have gotten worse in the past few days. She has urinary urgency and frequency but no burning. No fever. Her BMs are regular. She has nausea without vomiting. No URI symptoms. Also about 4 days ago she developed some burning and itching on the right chest and right flank and areas of rash appeared.    Review of Systems  Constitutional: Negative.   HENT: Negative.   Eyes: Negative.   Respiratory: Negative.   Cardiovascular: Negative.   Gastrointestinal: Positive for nausea and abdominal pain. Negative for vomiting, diarrhea, constipation, blood in stool and abdominal distention.  Genitourinary: Positive for urgency and frequency. Negative for dysuria, flank pain and difficulty urinating.       Objective:   Physical Exam  Constitutional: She appears well-developed and well-nourished.  Neck: No thyromegaly present.  Cardiovascular: Normal rate, regular rhythm, normal heart sounds and intact distal pulses.   Pulmonary/Chest: Effort normal and breath sounds normal.  Abdominal: Soft. Bowel sounds are normal. She exhibits no distension and no mass. There is no rebound and no guarding.  Moderate tenderness across the lower abdomen   Lymphadenopathy:    She has no cervical adenopathy.  Skin:  Patches of red papules and vesicles on the right middle chest           Assessment & Plan:  She has both shingles and another bout of diverticulitis. Treat with Valtrex and Cipro. Drink fluids. Recheck prn.

## 2012-07-29 ENCOUNTER — Ambulatory Visit (INDEPENDENT_AMBULATORY_CARE_PROVIDER_SITE_OTHER): Payer: Medicare Other | Admitting: Family Medicine

## 2012-07-29 ENCOUNTER — Telehealth: Payer: Self-pay | Admitting: Family Medicine

## 2012-07-29 ENCOUNTER — Encounter: Payer: Self-pay | Admitting: Family Medicine

## 2012-07-29 ENCOUNTER — Ambulatory Visit: Payer: Medicare Other

## 2012-07-29 VITALS — BP 150/84 | Temp 97.8°F

## 2012-07-29 DIAGNOSIS — H539 Unspecified visual disturbance: Secondary | ICD-10-CM | POA: Diagnosis not present

## 2012-07-29 NOTE — Telephone Encounter (Signed)
Called regarding medication, Ciprofloxin ordered 07/28/12 for diverticulitis.  Had right total knee replacement 06/07/12.  Read insert with antibiotic that says Cipro might cause inflammation or swelling of tendons.  Spoke with pharmacist who advised she not to take it and call MD.  Asking for a different antibiotic, even Amoxicillin be called to [facility] at [address]/ [address] [phone].  Please call back to advise what MD recommends.

## 2012-07-29 NOTE — Telephone Encounter (Signed)
Patient Information:  Caller Name: Eunice Blase  Phone: [phone]  Patient: Rachael [name]  Gender: Female  DOB: [birth year]  Age: 79 Years  PCP: Gershon Crane [facility])  Office Follow Up:  Does the office need to follow up with this patient?: Yes  Instructions For The Office: No appointments remain with Dr Clent Ridges for see now disposition; please call back ASAP to advise if can be worked into schedule.  RN Note:  Blurred vision inner corner of right eye. Denies eye discharge, redness, eye pain or itching of eye. Haas healing shingles on breast.  Advised to see MD now  due to office is open for sudden change in vision per Eye Pain or Vision change guideline. No appointments remain with Dr Clent Ridges. Please call back ASAP to advise if can be worked into schedule.   Symptoms  Reason For Call & Symptoms: Noted vision is blurred in inner corner of right eye. Diagnosed with shingles on breast/chest and taking Acyclovir.  Asking if needs eye drops for shingles?  Reviewed Health History In EMR: Yes  Reviewed Medications In EMR: Yes  Reviewed Allergies In EMR: Yes  Reviewed Surgeries / Procedures: Yes  Date of Onset of Symptoms: 07/29/2012  Treatments Tried: washed out eye  Treatments Tried Worked: No  Guideline(s) Used:  No Protocol Available - Sick Adult  Disposition Per Guideline:   Go to ED Now (or to Office with PCP Approval)  Reason For Disposition Reached:   Nursing judgment  Advice Given:  N/A  RN Overrode Recommendation:  Make Appointment  Office is open

## 2012-07-29 NOTE — Telephone Encounter (Signed)
Pt was scheduled for OV today

## 2012-07-29 NOTE — Patient Instructions (Addendum)
We will call you regarding eye appointment. You do NOT have any evidence for shingles involving the eye.

## 2012-07-29 NOTE — Progress Notes (Signed)
Subjective:    Patient ID: Rachael [name], female    DOB: 26-Oct-1930, 79 y.o.   MRN: [id number]  HPI  Acute visit.  Pt seen with right eye ?mild blurring medial visual field.   No amaurosis fugax.  No diplopia. No eye injury, redness,drainage, or pain. No headaches, facial weakness, speech changes, focal weakness, or confusion. Pt was concerned about possible shingles involving eye though no other symptoms as above. Recent right chest wall rash treated for possible shingles. Chest wall rash itches slightly and non painful..  Recent carotid dopplers 11/13 only mild bilateral disease. Pt reportedly had extensive evaluation by ophthalmologist few months ago She has known cataracts.  Past Medical History  Diagnosis Date  . GERD (gastroesophageal reflux disease)   . Gout     has seen Dr. Alben Deeds  . CAD (coronary artery disease)     sees Dr. Antoine Poche, normal Myoview stress test 02-26-12   . Hypothyroidism   . Hyperglycemia   . Depression   . Anemia   . Diverticulitis of colon   . Arthritis   . Right knee DJD   . H/O hiatal hernia   . Supraventricular tachycardia 04/07/12    In Dr Sherene Sires office - wore heart monitor for 3 weeks   . Shortness of breath     with anxiety attacks  . Anxiety   . Lightheadedness    Past Surgical History  Procedure Laterality Date  . Hiatal hernia repair  09-21-10    per Dr. Luretha Murphy, lap [employer]   . Hernia repair    . Abdominal hysterectomy    . Total knee arthroplasty  06/07/2012    Procedure: TOTAL KNEE ARTHROPLASTY;  Surgeon: Nilda Simmer, MD;  Location: [facility] OR;  Service: Orthopedics;  Laterality: Right;  . Partial colectomy      for adhesions  . Colonoscopy  10-10-05    per Dr. Juanda Chance, repeat in 10 yrs   . Colon surgery      reports that she has never smoked. She has never used smokeless tobacco. She reports that she does not drink alcohol or use illicit drugs. family history includes Diabetes Mellitus II in an unspecified  family member and Heart disease in an unspecified family member. Allergies  Allergen Reactions  . Meperidine Hcl Other (See Comments)    Unknown   . Sulfonamide Derivatives Other (See Comments)    Happened about 15 years ago      Review of Systems  Constitutional: Negative for fever, chills and unexpected weight change.  HENT: Negative for trouble swallowing.   Eyes: Negative for photophobia, pain, discharge, redness and itching.  Respiratory: Negative for cough and shortness of breath.   Cardiovascular: Negative for chest pain.  Neurological: Negative for dizziness and headaches.  Psychiatric/Behavioral: Negative for confusion.       Objective:   Physical Exam  Constitutional: She is oriented to person, place, and time. She appears well-developed and well-nourished.  HENT:  Right Ear: External ear normal.  Left Ear: External ear normal.  Mouth/Throat: Oropharynx is clear and moist.  Eyes: Conjunctivae and EOM are normal. Pupils are equal, round, and reactive to light. Right eye exhibits no discharge. Left eye exhibits no discharge.  Pt has bilateral cataracts.  EOMI.   Unable to see fundi secondary to cataracts.  No corneal defects.    Neck: Neck supple. No thyromegaly present.  Cardiovascular: Normal rate.   Pulmonary/Chest: Effort normal and breath sounds normal. No respiratory distress. She has no  wheezes. She has no rales.  Neurological: She is alert and oriented to person, place, and time. No cranial nerve deficit.  No focal weakness. Gait normal.    Skin: Rash noted.  Drying ?follicular rash right chest wall with slight extension to left chest wall-nontender.  Psychiatric: She has a normal mood and affect. Her behavior is normal. Thought content normal.          Assessment & Plan:  Visual disturbance right eye.  She has bilateral cataracts but ? Acute worsening.  No amaurosis fugax. No evidence for eye infection.  NO other focal neurologic findings.  Recent  carotid dopplers no significant ICA disease.  Get back in to see her ophthalmologist.

## 2012-07-31 NOTE — Telephone Encounter (Signed)
Pls advise.  

## 2012-08-02 MED ORDER — AMOXICILLIN-POT CLAVULANATE 875-125 MG PO TABS: 1.0000 | ORAL_TABLET | Freq: Two times a day (BID) | ORAL | Status: DC

## 2012-08-02 NOTE — Progress Notes (Signed)
Quick Note:  I spoke with pt ______ 

## 2012-08-02 NOTE — Telephone Encounter (Signed)
I sent new script e-scribe and spoke with pt. 

## 2012-08-02 NOTE — Telephone Encounter (Signed)
Switch from Cipro to Augmentin 875 bid for 10 days

## 2012-08-05 ENCOUNTER — Ambulatory Visit: Payer: No Typology Code available for payment source | Attending: Orthopedic Surgery | Admitting: Physical Therapy

## 2012-08-05 DIAGNOSIS — M25569 Pain in unspecified knee: Secondary | ICD-10-CM | POA: Insufficient documentation

## 2012-08-05 DIAGNOSIS — M25669 Stiffness of unspecified knee, not elsewhere classified: Secondary | ICD-10-CM | POA: Insufficient documentation

## 2012-08-05 DIAGNOSIS — R262 Difficulty in walking, not elsewhere classified: Secondary | ICD-10-CM | POA: Insufficient documentation

## 2012-08-05 DIAGNOSIS — IMO0001 Reserved for inherently not codable concepts without codable children: Secondary | ICD-10-CM | POA: Insufficient documentation

## 2012-08-09 ENCOUNTER — Encounter (INDEPENDENT_AMBULATORY_CARE_PROVIDER_SITE_OTHER): Payer: Medicare Other | Admitting: Ophthalmology

## 2012-08-09 ENCOUNTER — Ambulatory Visit: Payer: No Typology Code available for payment source | Admitting: Physical Therapy

## 2012-08-10 ENCOUNTER — Ambulatory Visit: Payer: No Typology Code available for payment source | Admitting: Physical Therapy

## 2012-08-12 ENCOUNTER — Encounter (INDEPENDENT_AMBULATORY_CARE_PROVIDER_SITE_OTHER): Payer: Medicare Other | Admitting: Ophthalmology

## 2012-08-16 ENCOUNTER — Ambulatory Visit: Payer: No Typology Code available for payment source | Admitting: Physical Therapy

## 2012-08-16 DIAGNOSIS — M25569 Pain in unspecified knee: Secondary | ICD-10-CM | POA: Diagnosis not present

## 2012-08-16 DIAGNOSIS — R262 Difficulty in walking, not elsewhere classified: Secondary | ICD-10-CM | POA: Diagnosis not present

## 2012-08-16 DIAGNOSIS — IMO0001 Reserved for inherently not codable concepts without codable children: Secondary | ICD-10-CM | POA: Diagnosis not present

## 2012-08-16 DIAGNOSIS — M25669 Stiffness of unspecified knee, not elsewhere classified: Secondary | ICD-10-CM | POA: Diagnosis not present

## 2012-08-18 ENCOUNTER — Ambulatory Visit: Payer: No Typology Code available for payment source | Admitting: Physical Therapy

## 2012-08-18 DIAGNOSIS — R262 Difficulty in walking, not elsewhere classified: Secondary | ICD-10-CM | POA: Diagnosis not present

## 2012-08-18 DIAGNOSIS — M25569 Pain in unspecified knee: Secondary | ICD-10-CM | POA: Diagnosis not present

## 2012-08-18 DIAGNOSIS — IMO0001 Reserved for inherently not codable concepts without codable children: Secondary | ICD-10-CM | POA: Diagnosis not present

## 2012-08-18 DIAGNOSIS — M25669 Stiffness of unspecified knee, not elsewhere classified: Secondary | ICD-10-CM | POA: Diagnosis not present

## 2012-08-23 ENCOUNTER — Ambulatory Visit: Payer: No Typology Code available for payment source | Admitting: Physical Therapy

## 2012-08-23 DIAGNOSIS — M25669 Stiffness of unspecified knee, not elsewhere classified: Secondary | ICD-10-CM | POA: Diagnosis not present

## 2012-08-23 DIAGNOSIS — R262 Difficulty in walking, not elsewhere classified: Secondary | ICD-10-CM | POA: Diagnosis not present

## 2012-08-23 DIAGNOSIS — IMO0001 Reserved for inherently not codable concepts without codable children: Secondary | ICD-10-CM | POA: Diagnosis not present

## 2012-08-23 DIAGNOSIS — M25569 Pain in unspecified knee: Secondary | ICD-10-CM | POA: Diagnosis not present

## 2012-08-24 ENCOUNTER — Telehealth (INDEPENDENT_AMBULATORY_CARE_PROVIDER_SITE_OTHER): Payer: Self-pay

## 2012-08-24 NOTE — Telephone Encounter (Signed)
Called pt.  I let her know that her previous orders for Sx have expired and that I will have to have MM place a new set of orders in the system for her.  She inquired about getting a CT or MRI done before hand.  I explained that MM is out of the office until the 10th and that I will discuss this with him when he returns.

## 2012-08-25 ENCOUNTER — Ambulatory Visit (INDEPENDENT_AMBULATORY_CARE_PROVIDER_SITE_OTHER): Payer: Medicare Other | Admitting: Family Medicine

## 2012-08-25 ENCOUNTER — Encounter: Payer: Self-pay | Admitting: Family Medicine

## 2012-08-25 ENCOUNTER — Ambulatory Visit: Payer: Medicare Other | Attending: Orthopedic Surgery | Admitting: Physical Therapy

## 2012-08-25 VITALS — BP 132/80 | HR 88 | Temp 98.3°F | Wt 148.0 lb

## 2012-08-25 DIAGNOSIS — L259 Unspecified contact dermatitis, unspecified cause: Secondary | ICD-10-CM | POA: Diagnosis not present

## 2012-08-25 DIAGNOSIS — M25669 Stiffness of unspecified knee, not elsewhere classified: Secondary | ICD-10-CM | POA: Insufficient documentation

## 2012-08-25 DIAGNOSIS — M25569 Pain in unspecified knee: Secondary | ICD-10-CM | POA: Diagnosis not present

## 2012-08-25 DIAGNOSIS — R1032 Left lower quadrant pain: Secondary | ICD-10-CM | POA: Diagnosis not present

## 2012-08-25 DIAGNOSIS — L309 Dermatitis, unspecified: Secondary | ICD-10-CM

## 2012-08-25 DIAGNOSIS — IMO0001 Reserved for inherently not codable concepts without codable children: Secondary | ICD-10-CM | POA: Insufficient documentation

## 2012-08-25 DIAGNOSIS — R262 Difficulty in walking, not elsewhere classified: Secondary | ICD-10-CM | POA: Insufficient documentation

## 2012-08-25 NOTE — Progress Notes (Signed)
  Subjective:    Patient ID: Rachael [name], female    DOB: Nov 14, 1930, 79 y.o.   MRN: [id number]  HPI Here for continuing LLQ pains. No fever or nausea. She did not get the Augmentin we prescribed because it was too expensive. She also asks about itchy skin over the torso and extremitities. She uses [employer].    Review of Systems  Constitutional: Negative.   Gastrointestinal: Positive for abdominal pain. Negative for nausea, vomiting, diarrhea, constipation, blood in stool, abdominal distention and rectal pain.       Objective:   Physical Exam  Constitutional: She appears well-developed and well-nourished.  Abdominal: Soft. Bowel sounds are normal. She exhibits no distension and no mass. There is tenderness. There is no rebound and no guarding.  Skin: No rash noted.          Assessment & Plan:  She has dry skin so I suggested she apply Aloe moisturizer lotion daily. Get a CT scan to assess her abdominal pain.

## 2012-08-26 ENCOUNTER — Ambulatory Visit (INDEPENDENT_AMBULATORY_CARE_PROVIDER_SITE_OTHER)
Admission: RE | Admit: 2012-08-26 | Discharge: 2012-08-26 | Disposition: A | Discharge status: Home / Self Care | Payer: Medicare Other | Source: Ambulatory Visit | Attending: Family Medicine | Admitting: Family Medicine

## 2012-08-26 ENCOUNTER — Telehealth: Payer: Self-pay | Admitting: Family Medicine

## 2012-08-26 ENCOUNTER — Other Ambulatory Visit: Payer: Self-pay | Admitting: Family Medicine

## 2012-08-26 DIAGNOSIS — R1032 Left lower quadrant pain: Secondary | ICD-10-CM | POA: Diagnosis not present

## 2012-08-26 DIAGNOSIS — K7689 Other specified diseases of liver: Secondary | ICD-10-CM | POA: Diagnosis not present

## 2012-08-26 MED ORDER — IOHEXOL 300 MG/ML  SOLN
100.0000 mL | Freq: Once | INTRAMUSCULAR | Status: AC | PRN
Start: 2012-08-26 — End: 2012-08-26
  Administered 2012-08-26: 100 mL via INTRAVENOUS

## 2012-08-26 NOTE — Telephone Encounter (Signed)
Patient called stating that she would like to have a refill of acyclovir 800mg  1po tid sent to [facility] as she has bumps on her chest. Please assist.

## 2012-08-26 NOTE — Telephone Encounter (Signed)
Okay per Dr. Clent Ridges to send in one refill, I did send script e-scribe and spoke with pt.

## 2012-08-31 ENCOUNTER — Ambulatory Visit: Payer: Medicare Other | Admitting: Physical Therapy

## 2012-08-31 DIAGNOSIS — M25569 Pain in unspecified knee: Secondary | ICD-10-CM | POA: Diagnosis not present

## 2012-08-31 DIAGNOSIS — IMO0001 Reserved for inherently not codable concepts without codable children: Secondary | ICD-10-CM | POA: Diagnosis not present

## 2012-08-31 DIAGNOSIS — M25669 Stiffness of unspecified knee, not elsewhere classified: Secondary | ICD-10-CM | POA: Diagnosis not present

## 2012-08-31 DIAGNOSIS — R262 Difficulty in walking, not elsewhere classified: Secondary | ICD-10-CM | POA: Diagnosis not present

## 2012-08-31 NOTE — Progress Notes (Signed)
Quick Note:  I spoke with pt ______ 

## 2012-09-02 ENCOUNTER — Ambulatory Visit: Payer: Medicare Other | Admitting: Physical Therapy

## 2012-09-02 DIAGNOSIS — R262 Difficulty in walking, not elsewhere classified: Secondary | ICD-10-CM | POA: Diagnosis not present

## 2012-09-02 DIAGNOSIS — IMO0001 Reserved for inherently not codable concepts without codable children: Secondary | ICD-10-CM | POA: Diagnosis not present

## 2012-09-02 DIAGNOSIS — M25569 Pain in unspecified knee: Secondary | ICD-10-CM | POA: Diagnosis not present

## 2012-09-02 DIAGNOSIS — M25669 Stiffness of unspecified knee, not elsewhere classified: Secondary | ICD-10-CM | POA: Diagnosis not present

## 2012-09-07 ENCOUNTER — Ambulatory Visit: Payer: Medicare Other | Admitting: Physical Therapy

## 2012-09-07 DIAGNOSIS — IMO0001 Reserved for inherently not codable concepts without codable children: Secondary | ICD-10-CM | POA: Diagnosis not present

## 2012-09-07 DIAGNOSIS — R262 Difficulty in walking, not elsewhere classified: Secondary | ICD-10-CM | POA: Diagnosis not present

## 2012-09-07 DIAGNOSIS — M25569 Pain in unspecified knee: Secondary | ICD-10-CM | POA: Diagnosis not present

## 2012-09-07 DIAGNOSIS — M25669 Stiffness of unspecified knee, not elsewhere classified: Secondary | ICD-10-CM | POA: Diagnosis not present

## 2012-09-09 ENCOUNTER — Ambulatory Visit: Payer: Medicare Other | Admitting: Rehabilitation

## 2012-09-16 ENCOUNTER — Ambulatory Visit: Payer: Medicare Other | Admitting: Physical Therapy

## 2012-09-16 DIAGNOSIS — R262 Difficulty in walking, not elsewhere classified: Secondary | ICD-10-CM | POA: Diagnosis not present

## 2012-09-16 DIAGNOSIS — IMO0001 Reserved for inherently not codable concepts without codable children: Secondary | ICD-10-CM | POA: Diagnosis not present

## 2012-09-16 DIAGNOSIS — M25669 Stiffness of unspecified knee, not elsewhere classified: Secondary | ICD-10-CM | POA: Diagnosis not present

## 2012-09-16 DIAGNOSIS — M25569 Pain in unspecified knee: Secondary | ICD-10-CM | POA: Diagnosis not present

## 2012-09-21 DIAGNOSIS — Z471 Aftercare following joint replacement surgery: Secondary | ICD-10-CM | POA: Diagnosis not present

## 2012-09-21 DIAGNOSIS — Z96659 Presence of unspecified artificial knee joint: Secondary | ICD-10-CM | POA: Diagnosis not present

## 2012-09-27 ENCOUNTER — Other Ambulatory Visit: Payer: Self-pay | Admitting: Family Medicine

## 2012-09-30 ENCOUNTER — Ambulatory Visit (INDEPENDENT_AMBULATORY_CARE_PROVIDER_SITE_OTHER): Payer: Medicare Other | Admitting: Family Medicine

## 2012-09-30 ENCOUNTER — Encounter: Payer: Self-pay | Admitting: Family Medicine

## 2012-09-30 VITALS — BP 134/76 | HR 91 | Temp 98.3°F | Wt 149.0 lb

## 2012-09-30 DIAGNOSIS — R21 Rash and other nonspecific skin eruption: Secondary | ICD-10-CM | POA: Diagnosis not present

## 2012-09-30 DIAGNOSIS — M1990 Unspecified osteoarthritis, unspecified site: Secondary | ICD-10-CM

## 2012-09-30 DIAGNOSIS — K449 Diaphragmatic hernia without obstruction or gangrene: Secondary | ICD-10-CM | POA: Diagnosis not present

## 2012-09-30 DIAGNOSIS — M545 Low back pain: Secondary | ICD-10-CM | POA: Diagnosis not present

## 2012-09-30 MED ORDER — BETAMETHASONE DIPROPIONATE 0.05 % EX CREA: TOPICAL_CREAM | Freq: Two times a day (BID) | CUTANEOUS | Status: DC

## 2012-09-30 MED ORDER — HYDROCODONE-ACETAMINOPHEN 10-325 MG PO TABS: 1.0000 | ORAL_TABLET | Freq: Four times a day (QID) | ORAL | Status: DC | PRN

## 2012-09-30 NOTE — Progress Notes (Signed)
  Subjective:    Patient ID: Rachael [name], female    DOB: [birth year], 79 y.o.   MRN: [id number]  HPI Here to discuss her pain meds and her rash. She has had an itchy rash over the chest for the past 2 months. She will be seeing Dr. Daphine Deutscher soon about her recurrent hiatal hernia.    Review of Systems  Constitutional: Negative.   Gastrointestinal: Positive for abdominal pain. Negative for nausea, vomiting, diarrhea, constipation, blood in stool and abdominal distention.  Skin: Positive for rash.       Objective:   Physical Exam  Constitutional: She appears well-developed and well-nourished.  Cardiovascular: Normal rate, regular rhythm, normal heart sounds and intact distal pulses.   Pulmonary/Chest: Effort normal and breath sounds normal.  Abdominal: Soft. Bowel sounds are normal. She exhibits no distension and no mass. There is no tenderness. There is no rebound and no guarding.  Skin:  Diffuse red papular rash over the trunk           Assessment & Plan:  Wrote for more Vicodin to use prn. Follow up with Dr. Daphine Deutscher. Refer her to Dermatology for the rash.

## 2012-10-01 ENCOUNTER — Encounter (INDEPENDENT_AMBULATORY_CARE_PROVIDER_SITE_OTHER): Payer: Self-pay | Admitting: Surgery

## 2012-10-01 ENCOUNTER — Telehealth: Payer: Self-pay | Admitting: Family Medicine

## 2012-10-01 ENCOUNTER — Ambulatory Visit (INDEPENDENT_AMBULATORY_CARE_PROVIDER_SITE_OTHER): Payer: Medicare Other | Admitting: Surgery

## 2012-10-01 VITALS — BP 150/96 | HR 81 | Temp 97.9°F | Ht 64.0 in | Wt 148.8 lb

## 2012-10-01 DIAGNOSIS — Z09 Encounter for follow-up examination after completed treatment for conditions other than malignant neoplasm: Secondary | ICD-10-CM

## 2012-10-01 DIAGNOSIS — Z9889 Other specified postprocedural states: Secondary | ICD-10-CM | POA: Insufficient documentation

## 2012-10-01 MED ORDER — METOCLOPRAMIDE HCL 5 MG PO TABS: 5.0000 mg | ORAL_TABLET | Freq: Four times a day (QID) | ORAL | Status: DC

## 2012-10-01 MED ORDER — MECLIZINE HCL 25 MG PO TABS: 25.0000 mg | ORAL_TABLET | ORAL | Status: DC | PRN

## 2012-10-01 NOTE — Patient Instructions (Signed)
Metoclopramide injection What is this medicine? METOCLOPRAMIDE (met oh kloe PRA mide) is used to treat people with slow emptying of the stomach and intestinal tract. It may be used to prevent nausea and vomiting caused by cancer treatment or surgery. This medicine may also be used before certain stomach exams or procedures. This medicine may be used for other purposes; ask your health care provider or pharmacist if you have questions. What should I tell my health care provider before I take this medicine? They need to know if you have any of these conditions: -breast cancer -depression -diabetes -heart failure -high blood pressure -kidney disease -liver disease -Parkinson's disease or a movement disorder -pheochromocytoma -seizures -stomach obstruction, bleeding, or perforation -an unusual or allergic reaction to metoclopramide, procainamide, sulfites, other medicines, foods, dyes, or preservatives -pregnant or trying to get pregnant -breast-feeding How should I use this medicine? This medicine is for injection into a muscle or for infusion into a vein. It is given by a health care professional in a hospital or clinic setting. A special MedGuide will be given to you by the pharmacist with each prescription and refill. Be sure to read this information carefully each time. Talk to your pediatrician regarding the use of this medicine in children. Special care may be needed. Overdosage: If you think you have taken too much of this medicine contact a poison control center or emergency room at once. NOTE: This medicine is only for you. Do not share this medicine with others. What if I miss a dose? This does not apply. What may interact with this medicine? -acetaminophen -cyclosporine -digoxin -medicines for blood pressure -medicines for depression, especially an Monoamine Oxidase Inhibitor (MAOI) -medicines for diabetes, including insulin -medicines for hay fever and other  allergies -medicines for Parkinson's disease, like levodopa -medicines for sleep or for pain -tetracycline This list may not describe all possible interactions. Give your health care provider a list of all the medicines, herbs, non-prescription drugs, or dietary supplements you use. Also tell them if you smoke, drink alcohol, or use illegal drugs. Some items may interact with your medicine. What should I watch for while using this medicine? It may take a few weeks for your stomach condition to get better. However, do not take this medicine for longer than 12 weeks. The longer you take this medicine, and the more you take it, the greater your chances are of developing serious side effects. If you are an elderly patient, a female patient, or you have diabetes, you may be at an increased risk for side effects from this medicine. Contact your doctor immediately if you start having movements you cannot control such as lip smacking, rapid movements of the tongue, involuntary or uncontrollable movements of the eyes, head, arms and legs, or muscle twitches and spasms. Patients and their families should watch out for worsening depression or thoughts of suicide. Also watch out for any sudden or severe changes in feelings such as feeling anxious, agitated, panicky, irritable, hostile, aggressive, impulsive, severely restless, overly excited and hyperactive, or not being able to sleep. If this happens, especially at the beginning of treatment or after a change in dose, call your doctor. Do not treat yourself for high fever. Ask your doctor or health care professional for advice. You may get drowsy or dizzy. Do not drive, use machinery, or do anything that needs mental alertness until you know how this drug affects you. Do not stand or sit up quickly, especially if you are an older patient. This  reduces the risk of dizzy or fainting spells. Alcohol can make you more drowsy and dizzy. Avoid alcoholic drinks. What side  effects may I notice from receiving this medicine? Side effects that you should report to your doctor or health care professional as soon as possible: -allergic reactions like skin rash, itching or hives, swelling of the face, lips, or tongue -abnormal production of milk in females -breast enlargement in both males and females -change in the way you walk -difficulty moving, speaking or swallowing -drooling, lip smacking, or rapid movements of the tongue -excessive sweating -fever -involuntary or uncontrollable movements of the eyes, head, arms and legs -irregular heartbeat or palpitations -muscle twitches and spasms -unusually weak or tired Side effects that usually do not require medical attention (report to your doctor or health care professional if they continue or are bothersome): -change in sex drive or performance -depressed mood -diarrhea -difficulty sleeping -headache -menstrual changes -restless or nervous This list may not describe all possible side effects. Call your doctor for medical advice about side effects. You may report side effects to FDA at 1-800-FDA-1088. Where should I keep my medicine? This drug is given in a hospital or clinic and will not be stored at home. NOTE: This sheet is a summary. It may not cover all possible information. If you have questions about this medicine, talk to your doctor, pharmacist, or health care provider.  2013, [employer]vier/[employer] Standard. (01/06/2008 1:59:13 PM)

## 2012-10-01 NOTE — Telephone Encounter (Signed)
Patient contacted Triage regarding dizziness.  Have attempted to call patient twice at [phone]. No answer or voicemail.  No contact.

## 2012-10-01 NOTE — Addendum Note (Signed)
Addended by: Kern Reap B on: 10/01/2012 05:10 PM   Modules accepted: Orders

## 2012-10-01 NOTE — Telephone Encounter (Signed)
Rx sent to pharmacy.  Tried to call patient but voice mail was full

## 2012-10-01 NOTE — Progress Notes (Signed)
Rachael [name] 79 y.o.  Body mass index is 25.53 kg/(m^2).  Patient Active Problem List   Diagnosis Date Noted  . S/P Nissen fundoplication April 2012 10/01/2012  . CAD (coronary artery disease)   . Hypothyroidism   . Hyperglycemia   . Depression   . Anemia   . Diverticulitis of colon   . Arthritis   . Gout   . Right knee DJD   . Supraventricular tachycardia 04/07/2012  . CAROTID ARTERY DISEASE 07/31/2010  . ANGINA, HX OF 07/30/2010  . HIATAL HERNIA 06/17/2010  . ANKLE PAIN, RIGHT 05/01/2010  . CONJUNCTIVITIS 02/08/2010  . BUNION, LEFT FOOT 08/23/2009  . DIVERTICULITIS OF COLON 08/21/2009  . UNSPEC SYMPTOM ASSOC W/FEMALE GENITAL ORGANS 12/05/2008  . ALLERGIC RHINITIS 08/15/2008  . CHEST WALL PAIN, ANTERIOR 08/15/2008  . HYPERGLYCEMIA 06/21/2008  . ACUTE BRONCHITIS 01/07/2008  . OSTEOARTHRITIS 11/25/2007  . FOOT PAIN 11/25/2007  . HYPOTHYROIDISM 04/27/2007  . HYPERLIPIDEMIA 04/27/2007  . ANEMIA-NOS 04/27/2007  . DEPRESSION 04/27/2007  . CORONARY ARTERY DISEASE 04/27/2007  . GERD 04/27/2007  . LOW BACK PAIN, CHRONIC 04/27/2007  . FATIGUE 04/27/2007    Allergies  Allergen Reactions  . Meperidine Hcl Other (See Comments)    Unknown   . Sulfonamide Derivatives Other (See Comments)    Happened about 15 years ago    Past Surgical History  Procedure Laterality Date  . Hiatal hernia repair  09-21-10    per Dr. Luretha Murphy, lap [employer]   . Hernia repair    . Abdominal hysterectomy    . Total knee arthroplasty  06/07/2012    Procedure: TOTAL KNEE ARTHROPLASTY;  Surgeon: Nilda Simmer, MD;  Location: [facility] OR;  Service: Orthopedics;  Laterality: Right;  . Partial colectomy      for adhesions  . Colonoscopy  10-10-05    per Dr. Juanda Chance, repeat in 10 yrs   . Colon surgery     FRY,STEPHEN A, MD 1. S/P Nissen fundoplication April 2012     DATE OF PROCEDURE: 09/20/2010  PREOPERATIVE DIAGNOSIS: Large type 3 mixed hiatal hernia.  PROCEDURE: Laparoscopic takedown and  repair of large type 3 mixed  hiatal hernia, closure of the diaphragm with pledgeted sutures of 2-0  Surgidac, four posteriorly and one anteriorly, Nissen fundoplication  over a #50 bougie with three sutures, upper endoscopy by Dr. Ezzard Standing,  application of [facility] biologic diaphragmatic patch sutured with five  sutures anchoring it.  SURGEON: [place]. Daphine Deutscher, M.D.  ASSISTANT: Sandria Bales. Ezzard Standing, M.D.  Rachael [name] returns today wondering about whether Rachael [name] needs to have another operation. I reviewed her CT scan and Rachael [name] has a little bit of her wrap above her diaphragm it didn't look that bad. Certainly nothing like it was before. Rachael [name] has some problems with dysphagia at times. Only give her some Reglan for about a month and see if that improves her symptoms.  I explained Reglan and some of the complications of been reported with it-not limited to dystonia.  Rachael [name] has been having some problems with dizziness. I checked her and there is no evidence of nystagmus.  Rachael [name] could try a decongestant if this is related to allergies. Rachael [name] is followed by Dr. Shellia Carwin.  We'll see her back in about 5 weeks.  Matt B. Daphine Deutscher, MD, [facility] [phone] beeper [phone]  10/01/2012 10:46 AM

## 2012-10-01 NOTE — Telephone Encounter (Signed)
PT called again to request medication for her Vertigo. She states that it will be hard to go through the weekend without it. Please assist.

## 2012-10-01 NOTE — Telephone Encounter (Signed)
Call in Meclizine 25 mg to take q 4 hours prn dizziness, #60 with 5 rf

## 2012-11-01 ENCOUNTER — Telehealth: Payer: Self-pay | Admitting: Family Medicine

## 2012-11-01 NOTE — Telephone Encounter (Signed)
Per Dr. Fry, okay to schedule this. 

## 2012-11-01 NOTE — Telephone Encounter (Signed)
appt made/kh 

## 2012-11-01 NOTE — Telephone Encounter (Signed)
Pt called triage nurse last week due to dizziness. Pt is not better and would like appt tomorrow, 6/10. Nothing except 'same day' appt.  Pls advise.

## 2012-11-02 ENCOUNTER — Ambulatory Visit (INDEPENDENT_AMBULATORY_CARE_PROVIDER_SITE_OTHER): Payer: Medicare Other | Admitting: Family Medicine

## 2012-11-02 ENCOUNTER — Encounter: Payer: Self-pay | Admitting: Family Medicine

## 2012-11-02 VITALS — BP 142/80 | HR 84 | Temp 98.5°F | Wt 150.0 lb

## 2012-11-02 DIAGNOSIS — M1990 Unspecified osteoarthritis, unspecified site: Secondary | ICD-10-CM | POA: Diagnosis not present

## 2012-11-02 DIAGNOSIS — H8110 Benign paroxysmal vertigo, unspecified ear: Secondary | ICD-10-CM

## 2012-11-02 MED ORDER — DICLOFENAC SODIUM 75 MG PO TBEC: 75.0000 mg | DELAYED_RELEASE_TABLET | Freq: Two times a day (BID) | ORAL | Status: DC

## 2012-11-02 NOTE — Progress Notes (Signed)
  Subjective:    Patient ID: Rachael [name], female    DOB: Jul 17, 1930, 79 y.o.   MRN: [id number]  HPI Here for arthritis pains in the hips, knees, and feet. She has vertigo from time to time and uses Meclizine prn.    Review of Systems  Constitutional: Negative.   Musculoskeletal: Positive for arthralgias.  Neurological: Positive for dizziness. Negative for tremors, seizures, syncope, facial asymmetry, speech difficulty, weakness, light-headedness, numbness and headaches.       Objective:   Physical Exam  Constitutional: She is oriented to person, place, and time. She appears well-developed and well-nourished. No distress.  HENT:  Right Ear: External ear normal.  Left Ear: External ear normal.  Nose: Nose normal.  Mouth/Throat: Oropharynx is clear and moist.  Neurological: She is alert and oriented to person, place, and time.          Assessment & Plan:  Try Diclofenac prn. Use Meclizine prn

## 2012-11-04 ENCOUNTER — Encounter (INDEPENDENT_AMBULATORY_CARE_PROVIDER_SITE_OTHER): Payer: Medicare Other | Admitting: Surgery

## 2012-11-05 ENCOUNTER — Encounter (INDEPENDENT_AMBULATORY_CARE_PROVIDER_SITE_OTHER): Payer: Medicare Other | Admitting: Surgery

## 2012-11-08 ENCOUNTER — Telehealth: Payer: Self-pay | Admitting: Family Medicine

## 2012-11-08 DIAGNOSIS — R42 Dizziness and giddiness: Secondary | ICD-10-CM

## 2012-11-08 NOTE — Telephone Encounter (Signed)
Pt states that when she was here last, a possible referral was discussed for dizziness/vertigo x37mo. She would like the referral to whomever you think is best.

## 2012-11-08 NOTE — Telephone Encounter (Signed)
I did a referral to Neurology

## 2012-11-09 NOTE — Telephone Encounter (Signed)
I spoke with pt  

## 2012-11-29 ENCOUNTER — Other Ambulatory Visit: Payer: Self-pay | Admitting: Family Medicine

## 2012-12-02 ENCOUNTER — Ambulatory Visit: Payer: Medicare Other | Admitting: Neurology

## 2012-12-02 ENCOUNTER — Encounter: Payer: Self-pay | Admitting: Neurology

## 2012-12-02 ENCOUNTER — Ambulatory Visit (INDEPENDENT_AMBULATORY_CARE_PROVIDER_SITE_OTHER): Payer: Medicare Other | Admitting: Neurology

## 2012-12-02 VITALS — BP 130/72 | HR 76 | Temp 98.1°F | Resp 18 | Wt 150.0 lb

## 2012-12-02 DIAGNOSIS — G450 Vertebro-basilar artery syndrome: Secondary | ICD-10-CM

## 2012-12-02 DIAGNOSIS — R42 Dizziness and giddiness: Secondary | ICD-10-CM

## 2012-12-02 DIAGNOSIS — R404 Transient alteration of awareness: Secondary | ICD-10-CM

## 2012-12-02 DIAGNOSIS — R55 Syncope and collapse: Secondary | ICD-10-CM

## 2012-12-02 DIAGNOSIS — R4020 Unspecified coma: Secondary | ICD-10-CM

## 2012-12-02 DIAGNOSIS — I651 Occlusion and stenosis of basilar artery: Secondary | ICD-10-CM

## 2012-12-02 LAB — BASIC METABOLIC PANEL
BUN: 12 mg/dL (ref 6–23)
Chloride: 109 mEq/L (ref 96–112)
Creatinine, Ser: 1 mg/dL (ref 0.4–1.2)
GFR: 59.37 mL/min — ABNORMAL LOW (ref >60.00)
Glucose, Bld: 98 mg/dL (ref 70–99)
Potassium: 4 mEq/L (ref 3.5–5.1)

## 2012-12-02 NOTE — Patient Instructions (Addendum)
We will get imaging of your brain and blood vessels in your brain.  I want to see you in 2 weeks.  Your MRI/MRA is scheduled at [facility] located at [address] in [place] on Friday, July 18th at 1:00 pm . Please arrive 30 minutes prior to your appointment time.   [phone].

## 2012-12-02 NOTE — Progress Notes (Signed)
NEUROLOGY CONSULTATION NOTE  Rachael [name] MRN: [id number] DOB: Aug 03, 1930  Referring physician: Dr. Clent Ridges Primary care physician: Dr. Clent Ridges  Reason for consult:  dizziness  HISTORY OF PRESENT ILLNESS: Rachael [name] is a 79 y.o. female who presents for evaluation of dizziness. Onset of symptoms began this past April. One morning while getting out of bed, she felt a jerk that cause her to fall back into her bed. She had a sensation of lightheadedness as well. Since that time she has been having episodes of lightheadedness, presyncope, and possibly even syncope. Symptoms usually occur when she is laying supine with her head on the pillow. She develops a sensation of movement in her head , lightheadedness,, followed by blacking out of vision, and then possibly passing out for a second or two.  It actually seems to be triggered when she is laying on the right side of her occiput, as it is painful to her. There is no associated headache. There is no associated nausea. She does have a history of chronic tinnitus and she feels that she may have some mild recent hearing loss. She denies fullness in the ears. She was initially prescribed meclizine, which she took once. It seemed ineffective so she never took it again. She also feels a little unsteady when she walks. She has never had these symptoms before. She denies any focal numbness or weakness.  Other recent history this past year includes right total knee surgery. In February and March, she had flu-like symptoms described as cough, high temperature, and nasal congestion. She also developed a papular rash suspicious for her shingles. It involved the right side of her chest, the right side of her torso, as well as the right side of her neck going to the back of the head.  PAST MEDICAL HISTORY: Past Medical History  Diagnosis Date  . GERD (gastroesophageal reflux disease)   . Gout     has seen Dr. Alben Deeds  . CAD (coronary artery disease)    sees Dr. Antoine Poche, normal Myoview stress test 02-26-12   . Hypothyroidism   . Hyperglycemia   . Depression   . Anemia   . Diverticulitis of colon   . Arthritis   . Right knee DJD   . H/O hiatal hernia   . Supraventricular tachycardia 04/07/12    In Dr Sherene Sires office - wore heart monitor for 3 weeks   . Shortness of breath     with anxiety attacks  . Anxiety   . Lightheadedness     PAST SURGICAL HISTORY: Past Surgical History  Procedure Laterality Date  . Hiatal hernia repair  09-21-10    per Dr. Luretha Murphy, lap [employer]   . Hernia repair    . Abdominal hysterectomy    . Total knee arthroplasty  06/07/2012    Procedure: TOTAL KNEE ARTHROPLASTY;  Surgeon: Nilda Simmer, MD;  Location: [facility] OR;  Service: Orthopedics;  Laterality: Right;  . Partial colectomy      for adhesions  . Colonoscopy  10-10-05    per Dr. Juanda Chance, repeat in 10 yrs   . Colon surgery      MEDICATIONS: Current Outpatient Prescriptions on File Prior to Visit  Medication Sig Dispense Refill  . allopurinol (ZYLOPRIM) 100 MG tablet TAKE 1 TABLET BY MOUTH EVERY DAY  30 tablet  3  . HYDROcodone-acetaminophen (NORCO) 10-325 MG per tablet Take 1 tablet by mouth every 6 (six) hours as needed for pain.      Marland Kitchen levothyroxine (  SYNTHROID, LEVOTHROID) 100 MCG tablet TAKE ONE TABLET BY MOUTH ONCE DAILY.  30 tablet  3  . LORazepam (ATIVAN) 0.5 MG tablet Take 1 tablet (0.5 mg total) by mouth every 8 (eight) hours as needed for anxiety.  60 tablet  2  . metoCLOPramide (REGLAN) 5 MG tablet Take 1 tablet (5 mg total) by mouth 4 (four) times daily.  120 tablet  1  . nitroGLYCERIN (NITROSTAT) 0.3 MG SL tablet Place 1 tablet (0.3 mg total) under the tongue every 5 (five) minutes as needed for chest pain.  50 tablet  11  . Ranitidine HCl (RANITIDINE ACID REDUCER PO) Take 150 mg by mouth daily.      . betamethasone dipropionate (DIPROLENE) 0.05 % cream Apply topically 2 (two) times daily.  45 g  5  . diclofenac (VOLTAREN) 75 MG EC  tablet Take 1 tablet (75 mg total) by mouth 2 (two) times daily.  180 tablet  3  . meclizine (ANTIVERT) 25 MG tablet Take 1 tablet (25 mg total) by mouth every 4 (four) hours as needed for dizziness.  60 tablet  5   No current facility-administered medications on file prior to visit.    ALLERGIES: Allergies  Allergen Reactions  . Meperidine Hcl Other (See Comments)    Unknown   . Sulfonamide Derivatives Other (See Comments)    Happened about 15 years ago    FAMILY HISTORY: Family History  Problem Relation Age of Onset  . Diabetes Mellitus II    . Heart disease      SOCIAL HISTORY: History   Social History  . Marital Status: Divorced    Spouse Name: N/A    Number of Children: N/A  . Years of Education: N/A   Occupational History  . Not on file.   Social History Main Topics  . Smoking status: Never Smoker   . Smokeless tobacco: Never Used  . Alcohol Use: No  . Drug Use: No  . Sexually Active: Not on file   Other Topics Concern  . Not on file   Social History Narrative  . No narrative on file    REVIEW OF SYSTEMS: Constitutional: No fevers, chills, or sweats, no generalized fatigue, change in appetite Eyes: No visual changes, double vision, eye pain Ear, nose and throat: No hearing loss, ear pain, nasal congestion, sore throat Cardiovascular: No chest pain, palpitations Respiratory:  No shortness of breath at rest or with exertion, wheezes GastrointestinaI: No nausea, vomiting, diarrhea, abdominal pain, fecal incontinence Genitourinary:  No dysuria, urinary retention or frequency Musculoskeletal:  No neck pain, back pain Integumentary: No rash, pruritus, skin lesions Neurological: as above Psychiatric: No depression, insomnia, anxiety Endocrine: No palpitations, fatigue, diaphoresis, mood swings, change in appetite, change in weight, increased thirst Hematologic/Lymphatic:  No anemia, purpura, petechiae. Allergic/Immunologic: no itchy/runny eyes, nasal  congestion, recent allergic reactions, rashes      PHYSICAL EXAM: Filed Vitals:   12/02/12 0907  BP: 130/72  Pulse: 76  Temp: 98.1 F (36.7 C)  Resp: 18   General: No acute distress Head:  Normocephalic/atraumatic Neck: supple, no paraspinal tenderness, full range of motion Back: No paraspinal tenderness Heart: regular rate and rhythm Lungs: Clear to auscultation bilaterally. Vascular: No carotid bruits Neurological Exam: Mental status: alert and oriented to person, place, time and self, speech fluent and not dysarthric, language intact. Cranial nerves: CN I: not tested CN II: pupils equal, round and reactive to light, visual fields intact, fundi unremarkable CN III, IV, VI:  full range  of motion, no nystagmus, no ptosis CN V: facial sensation intact CN VII: upper and lower face symmetric CN VIII: hearing intact CN IX, X: gag intact, uvula midline CN XI: sternocleidomastoid and trapezius muscles intact CN XII: tongue midline Bulk & Tone: normal, no fasciculations. Muscle strength: 5/5 throughout Sensation: pinprick and vibration sensation intact Deep Tendon Reflexes: 2+ throughout, toes downgoing Finger to nose testing: without dysmetria Gait: antalgic. Romberg negative Dix-Hallpike maneuver negative, however palpation of the back of the right side of her occiput precipitated dizziness and lightheadedness.also, laying supine with the pillow touching the right side of her occiput also precipitated symptoms.  IMPRESSION & PLAN: Rachael [name] is a 79 y.o. female with episodes of vertigo, presyncope and possible brief syncope.  Differential includes benign positional vertigo, vertebrobasilar insufficiency (especially given her age), and vasovagal event triggered possibly by postherpetic neuralgia. 1. MRI Brain/IAC w/wo contrast to look for any inner ear or posterior fossa lesions. 2. MRA of Head to look for evidence of basilar stenosis 3. Follow up in 2 weeks.  Thank  you for allowing me to take part in the care of this patient.  Shon Millet, DO  CC: Gershon Crane, MD

## 2012-12-06 ENCOUNTER — Other Ambulatory Visit (HOSPITAL_COMMUNITY): Payer: Medicare Other

## 2012-12-10 ENCOUNTER — Ambulatory Visit
Admission: RE | Admit: 2012-12-10 | Discharge: 2012-12-10 | Disposition: A | Discharge status: Home / Self Care | Payer: Medicare Other | Source: Ambulatory Visit | Attending: Neurology | Admitting: Neurology

## 2012-12-10 DIAGNOSIS — R51 Headache: Secondary | ICD-10-CM | POA: Diagnosis not present

## 2012-12-10 DIAGNOSIS — G450 Vertebro-basilar artery syndrome: Secondary | ICD-10-CM

## 2012-12-10 DIAGNOSIS — R55 Syncope and collapse: Secondary | ICD-10-CM

## 2012-12-10 DIAGNOSIS — I651 Occlusion and stenosis of basilar artery: Secondary | ICD-10-CM

## 2012-12-10 DIAGNOSIS — R42 Dizziness and giddiness: Secondary | ICD-10-CM | POA: Diagnosis not present

## 2012-12-10 DIAGNOSIS — I672 Cerebral atherosclerosis: Secondary | ICD-10-CM | POA: Diagnosis not present

## 2012-12-10 MED ORDER — GADOBENATE DIMEGLUMINE 529 MG/ML IV SOLN
14.0000 mL | Freq: Once | INTRAVENOUS | Status: AC | PRN
  Administered 2012-12-10: 14 mL via INTRAVENOUS

## 2012-12-16 ENCOUNTER — Ambulatory Visit (INDEPENDENT_AMBULATORY_CARE_PROVIDER_SITE_OTHER): Payer: Medicare Other | Admitting: Neurology

## 2012-12-16 ENCOUNTER — Encounter: Payer: Self-pay | Admitting: Neurology

## 2012-12-16 VITALS — BP 122/76 | HR 84 | Temp 98.1°F | Resp 20 | Wt 150.0 lb

## 2012-12-16 DIAGNOSIS — R42 Dizziness and giddiness: Secondary | ICD-10-CM

## 2012-12-16 NOTE — Patient Instructions (Addendum)
MRI of brain and blood vessels of brain do not show tumors or blockage of basilar artery, which is good.  I suspect it is an inner ear problem causing vertigo.  The vertigo may be causing a vasovagal response that can cause lightheadedness.   I would like you evaluated by ear, nose and throat.  You may continue meclizine but use it sparingly. Take time and be careful standing up, moving or moving your head.   We will refer you to [facility] located at [address] in [place], [place] [address] [phone].  Their office will contact you with an appointment.

## 2012-12-16 NOTE — Progress Notes (Signed)
NEUROLOGY FOLLOW UP OFFICE NOTE  Rachael [name] [id number]  HISTORY OF PRESENT ILLNESS: Rachael [name] is a 79 y.o. female who presents for follow-up regarding dizziness and lightheadedness.  She is with her daughter.  She develops a sensation of movement in her head , lightheadedness,, followed by blacking out of vision, and then possibly passing out for a second or two. It occurs with change in head movements. There is no associated headache. There is no associated nausea. She does have a history of chronic tinnitus and she feels that she may have some mild recent hearing loss. She denies fullness in the ears. She was initially prescribed meclizine, which she took once. It seemed ineffective so she never took it again. She also feels a little unsteady when she walks. She has never had these symptoms before. She denies any focal numbness or weakness.  Images personally reviewed. MRI Brain was performed to rule out posterior fossa lesion, which was negative. MRA Head was performed to rule out basilar stenosis, which was unremarkable.  PAST MEDICAL HISTORY: Past Medical History  Diagnosis Date  . GERD (gastroesophageal reflux disease)   . Gout     has seen Dr. Alben Deeds  . CAD (coronary artery disease)     sees Dr. Antoine Poche, normal Myoview stress test 02-26-12   . Hypothyroidism   . Hyperglycemia   . Depression   . Anemia   . Diverticulitis of colon   . Arthritis   . Right knee DJD   . H/O hiatal hernia   . Supraventricular tachycardia 04/07/12    In Dr Sherene Sires office - wore heart monitor for 3 weeks   . Shortness of breath     with anxiety attacks  . Anxiety   . Lightheadedness     MEDICATIONS: Current Outpatient Prescriptions on File Prior to Visit  Medication Sig Dispense Refill  . allopurinol (ZYLOPRIM) 100 MG tablet TAKE 1 TABLET BY MOUTH EVERY DAY  30 tablet  3  . diclofenac (VOLTAREN) 75 MG EC tablet Take 1 tablet (75 mg total) by mouth 2 (two) times daily.   180 tablet  3  . HYDROcodone-acetaminophen (NORCO) 10-325 MG per tablet Take 1 tablet by mouth every 6 (six) hours as needed for pain.      Marland Kitchen levothyroxine (SYNTHROID, LEVOTHROID) 100 MCG tablet TAKE ONE TABLET BY MOUTH ONCE DAILY.  30 tablet  3  . LORazepam (ATIVAN) 0.5 MG tablet Take 1 tablet (0.5 mg total) by mouth every 8 (eight) hours as needed for anxiety.  60 tablet  2  . metoCLOPramide (REGLAN) 5 MG tablet Take 1 tablet (5 mg total) by mouth 4 (four) times daily.  120 tablet  1  . nitroGLYCERIN (NITROSTAT) 0.3 MG SL tablet Place 1 tablet (0.3 mg total) under the tongue every 5 (five) minutes as needed for chest pain.  50 tablet  11  . Ranitidine HCl (RANITIDINE ACID REDUCER PO) Take 150 mg by mouth daily.      . betamethasone dipropionate (DIPROLENE) 0.05 % cream Apply topically 2 (two) times daily.  45 g  5  . meclizine (ANTIVERT) 25 MG tablet Take 1 tablet (25 mg total) by mouth every 4 (four) hours as needed for dizziness.  60 tablet  5   No current facility-administered medications on file prior to visit.    ALLERGIES: Allergies  Allergen Reactions  . Meperidine Hcl Other (See Comments)    Unknown   . Sulfonamide Derivatives Other (See Comments)  Happened about 15 years ago    FAMILY HISTORY: Family History  Problem Relation Age of Onset  . Diabetes Mellitus II    . Heart disease      SOCIAL HISTORY: History   Social History  . Marital Status: Divorced    Spouse Name: N/A    Number of Children: N/A  . Years of Education: N/A   Occupational History  . Not on file.   Social History Main Topics  . Smoking status: Never Smoker   . Smokeless tobacco: Never Used  . Alcohol Use: No  . Drug Use: No  . Sexually Active: Not on file   Other Topics Concern  . Not on file   Social History Narrative  . No narrative on file    PHYSICAL EXAM: Filed Vitals:   12/16/12 0949  BP: 122/76  Pulse: 84  Temp: 98.1 F (36.7 C)  Resp: 20   General: No acute  distress Head:  Normocephalic/atraumatic Neurological Exam: not performed this visit.  IMPRESSION & PLAN: Rachael [name] is a 79 y.o. female with episode of dizziness and lightheadedness, most likely inner ear dysfunction (benign positional vertigo), which may be triggering a vasovagal response causing lightheadedness.  Anxiety may be playing a role as well.  There does not seem to be a neurological etiology for her symptoms. 1.  Referral to ENT for evaluation of dysequilibrium and hearing deficits. 2.  May use meclizine, but only sparingly so CNS vestibular system could compensate for peripheral vestibular dysfunction. 3.  Follow up as needed.  Call with questions and concerns.  15 minutes spent with patient and her daughter, 100% spent counseling, discussing MRI results and coordinating care.  Shon Millet, DO  CC: Gershon Crane, MD

## 2012-12-30 ENCOUNTER — Other Ambulatory Visit: Payer: Self-pay | Admitting: Family Medicine

## 2013-01-11 ENCOUNTER — Ambulatory Visit (INDEPENDENT_AMBULATORY_CARE_PROVIDER_SITE_OTHER): Payer: Medicare Other | Admitting: Family Medicine

## 2013-01-11 ENCOUNTER — Encounter: Payer: Self-pay | Admitting: Family Medicine

## 2013-01-11 ENCOUNTER — Telehealth: Payer: Self-pay | Admitting: Family Medicine

## 2013-01-11 VITALS — BP 140/80 | HR 70 | Temp 98.5°F | Wt 153.0 lb

## 2013-01-11 DIAGNOSIS — E039 Hypothyroidism, unspecified: Secondary | ICD-10-CM

## 2013-01-11 DIAGNOSIS — G609 Hereditary and idiopathic neuropathy, unspecified: Secondary | ICD-10-CM

## 2013-01-11 DIAGNOSIS — M1990 Unspecified osteoarthritis, unspecified site: Secondary | ICD-10-CM

## 2013-01-11 DIAGNOSIS — R7309 Other abnormal glucose: Secondary | ICD-10-CM

## 2013-01-11 DIAGNOSIS — R739 Hyperglycemia, unspecified: Secondary | ICD-10-CM

## 2013-01-11 MED ORDER — ALLOPURINOL 100 MG PO TABS: 100.0000 mg | ORAL_TABLET | Freq: Every day | ORAL | Status: DC

## 2013-01-11 MED ORDER — NITROGLYCERIN 0.3 MG SL SUBL: 0.3000 mg | SUBLINGUAL_TABLET | SUBLINGUAL | Status: DC | PRN

## 2013-01-11 MED ORDER — LEVOTHYROXINE SODIUM 100 MCG PO TABS: 100.0000 ug | ORAL_TABLET | Freq: Every day | ORAL | Status: DC

## 2013-01-11 NOTE — Progress Notes (Signed)
  Subjective:    Patient ID: Rachael [name], female    DOB: 1931/01/19, 79 y.o.   MRN: [id number]  HPI Here for refills and other things. She will be leaving in a few weeks to [place], [place] where she will be staying with family for 2 months. She needs some med refills to take with her. She also complains of problems with both feet that started about 2 months ago. At first the toes and dorsal feet were numb, but now they tingle and burn also.    Review of Systems  Constitutional: Negative.   Respiratory: Negative.   Cardiovascular: Negative.   Musculoskeletal: Positive for arthralgias. Negative for joint swelling.  Neurological: Positive for numbness. Negative for dizziness, tremors, seizures, syncope, facial asymmetry, speech difficulty, weakness, light-headedness and headaches.       Objective:   Physical Exam  Constitutional: She is oriented to person, place, and time. She appears well-developed and well-nourished. No distress.  Cardiovascular: Normal rate, regular rhythm, normal heart sounds and intact distal pulses.   Pulmonary/Chest: Effort normal and breath sounds normal.  Musculoskeletal:  Both feet are warm and pink, not tender, no edema.   Neurological: She is alert and oriented to person, place, and time.          Assessment & Plan:  Refilled meds. She has peripheral neuropathy so we will get labs today to investigate this. She has a hx of hyperglycemia and not overt diabetes, but we will check an A1c.

## 2013-01-11 NOTE — Telephone Encounter (Signed)
Patient Information:  Caller Name: [place]  Phone: [phone]  Patient: Rachael [name], Rachael [name]  Gender: Female  DOB: 30-Jul-1930  Age: 80 Years  PCP: Gershon Crane [facility])  Office Follow Up:  Does the office need to follow up with this patient?: No  Instructions For The Office: N/A  RN Note:  Pt has had Foot Pain w/ numbness in Toes for 1 year, symptoms have worsen, causing issues walking due to pain.  Pt leaving for vacation on 9-1 for 2 months and would like all meds to be filled w/ note for flight, requesting wheelchair.  Appt scheduled on 8-19 at 1615 due to severe foot pain.   Symptoms  Reason For Call & Symptoms: Feet Pain w/ numbness in Toes  Reviewed Health History In EMR: Yes  Reviewed Medications In EMR: Yes  Reviewed Allergies In EMR: Yes  Reviewed Surgeries / Procedures: Yes  Date of Onset of Symptoms: 08/24/2012  Treatments Tried: Hydrocodone  Treatments Tried Worked: No  Guideline(s) Used:  Foot Pain  Disposition Per Guideline:   See Today in Office  Reason For Disposition Reached:   Severe pain (e.g., excruciating, unable to do any normal activities)  Advice Given:  N/A  Patient Will Follow Care Advice:  YES  Appointment Scheduled:  01/11/2013 16:15:00 Appointment Scheduled Provider:  Gershon Crane [facility])

## 2013-01-12 LAB — CBC WITH DIFFERENTIAL/PLATELET
Basophils Absolute: 0.1 10*3/uL (ref 0.0–0.1)
Eosinophils Relative: 3.9 % (ref 0.0–5.0)
Lymphocytes Relative: 33 % (ref 12.0–46.0)
Lymphs Abs: 3 10*3/uL (ref 0.7–4.0)
MCHC: 33 g/dL (ref 30.0–36.0)
MCV: 87.2 fl (ref 78.0–100.0)
Monocytes Relative: 7.9 % (ref 3.0–12.0)
RBC: 4.94 Mil/uL (ref 3.87–5.11)
RDW: 14.2 % (ref 11.5–14.6)

## 2013-01-12 LAB — BASIC METABOLIC PANEL
BUN: 18 mg/dL (ref 6–23)
Calcium: 9.8 mg/dL (ref 8.4–10.5)
Creatinine, Ser: 0.9 mg/dL (ref 0.4–1.2)
GFR: 63.95 mL/min (ref >60.00)
Glucose, Bld: 81 mg/dL (ref 70–99)
Potassium: 4.8 mEq/L (ref 3.5–5.1)

## 2013-01-14 ENCOUNTER — Telehealth: Payer: Self-pay | Admitting: Family Medicine

## 2013-01-14 NOTE — Telephone Encounter (Signed)
PT would like to request lab results from 01/11/13. She would also like to obtain a copy of these. Please assist.

## 2013-01-14 NOTE — Telephone Encounter (Signed)
Rachael [name] spoke with pt directly.  Pt is aware of lab results.

## 2013-01-17 ENCOUNTER — Telehealth: Payer: Self-pay | Admitting: Family Medicine

## 2013-01-17 NOTE — Telephone Encounter (Signed)
PT stopped by on her way out today, and stated that she is still itching on her side and on her head, from her shingles. She would like to receive another RX of "Acyclover". She would like this called into [facility] on [address]. Please assist.

## 2013-01-18 NOTE — Telephone Encounter (Signed)
Call in Acyclovir 800 mg tid #30

## 2013-01-19 MED ORDER — ACYCLOVIR 800 MG PO TABS: 800.0000 mg | ORAL_TABLET | Freq: Three times a day (TID) | ORAL | Status: DC

## 2013-01-19 NOTE — Telephone Encounter (Signed)
I sent script e-scribe. 

## 2013-01-21 ENCOUNTER — Encounter (INDEPENDENT_AMBULATORY_CARE_PROVIDER_SITE_OTHER): Payer: Self-pay | Admitting: Surgery

## 2013-01-21 ENCOUNTER — Ambulatory Visit (INDEPENDENT_AMBULATORY_CARE_PROVIDER_SITE_OTHER): Payer: Medicare Other | Admitting: Surgery

## 2013-01-21 VITALS — BP 126/74 | HR 72 | Temp 98.1°F | Resp 20 | Ht 65.0 in | Wt 154.0 lb

## 2013-01-21 DIAGNOSIS — Z09 Encounter for follow-up examination after completed treatment for conditions other than malignant neoplasm: Secondary | ICD-10-CM | POA: Diagnosis not present

## 2013-01-21 DIAGNOSIS — Z9889 Other specified postprocedural states: Secondary | ICD-10-CM

## 2013-01-21 NOTE — Patient Instructions (Signed)

## 2013-01-21 NOTE — Progress Notes (Signed)
Ms. Rachael [name] is going to [place] to visit her daughter. She was wanted to know if there was anything she needed to do before she left her she was in the risk. I reviewed her prior studies and her recent CT scan. Before I  operated on her most of her stomach was up in her chest. Recent CT scan shows a wrap intact at its hold of a hiatal hernia noted. I don't think is a problem. She does have some esophageal dysmotility.I think that she should go ahead and have a good time and not worried about these things. I'll see her back in 5 months. Greater than 15 min of face time was used in counseling and reassuring this patient Return 5 months

## 2013-02-15 DIAGNOSIS — H40039 Anatomical narrow angle, unspecified eye: Secondary | ICD-10-CM | POA: Diagnosis not present

## 2013-02-15 DIAGNOSIS — H5210 Myopia, unspecified eye: Secondary | ICD-10-CM | POA: Diagnosis not present

## 2013-02-15 DIAGNOSIS — H2589 Other age-related cataract: Secondary | ICD-10-CM | POA: Diagnosis not present

## 2013-02-15 DIAGNOSIS — H43819 Vitreous degeneration, unspecified eye: Secondary | ICD-10-CM | POA: Diagnosis not present

## 2013-02-15 DIAGNOSIS — H268 Other specified cataract: Secondary | ICD-10-CM | POA: Diagnosis not present

## 2013-03-15 ENCOUNTER — Other Ambulatory Visit: Payer: Self-pay | Admitting: Family Medicine

## 2013-03-23 DIAGNOSIS — M109 Gout, unspecified: Secondary | ICD-10-CM | POA: Diagnosis not present

## 2013-03-23 DIAGNOSIS — M159 Polyosteoarthritis, unspecified: Secondary | ICD-10-CM | POA: Diagnosis not present

## 2013-03-23 DIAGNOSIS — H109 Unspecified conjunctivitis: Secondary | ICD-10-CM | POA: Diagnosis not present

## 2013-03-23 DIAGNOSIS — B351 Tinea unguium: Secondary | ICD-10-CM | POA: Diagnosis not present

## 2013-03-29 DIAGNOSIS — M109 Gout, unspecified: Secondary | ICD-10-CM | POA: Diagnosis not present

## 2013-03-31 DIAGNOSIS — M19079 Primary osteoarthritis, unspecified ankle and foot: Secondary | ICD-10-CM | POA: Diagnosis not present

## 2013-04-05 DIAGNOSIS — M19079 Primary osteoarthritis, unspecified ankle and foot: Secondary | ICD-10-CM | POA: Diagnosis not present

## 2013-04-05 DIAGNOSIS — M21619 Bunion of unspecified foot: Secondary | ICD-10-CM | POA: Diagnosis not present

## 2013-04-05 DIAGNOSIS — M2140 Flat foot [pes planus] (acquired), unspecified foot: Secondary | ICD-10-CM | POA: Diagnosis not present

## 2013-04-05 DIAGNOSIS — M2040 Other hammer toe(s) (acquired), unspecified foot: Secondary | ICD-10-CM | POA: Diagnosis not present

## 2013-04-05 DIAGNOSIS — M2020 Hallux rigidus, unspecified foot: Secondary | ICD-10-CM | POA: Diagnosis not present

## 2013-04-05 DIAGNOSIS — M659 Synovitis and tenosynovitis, unspecified: Secondary | ICD-10-CM | POA: Diagnosis not present

## 2013-04-05 DIAGNOSIS — M79609 Pain in unspecified limb: Secondary | ICD-10-CM | POA: Diagnosis not present

## 2013-04-05 DIAGNOSIS — B351 Tinea unguium: Secondary | ICD-10-CM | POA: Diagnosis not present

## 2013-04-06 DIAGNOSIS — S85909A Unspecified injury of unspecified blood vessel at lower leg level, unspecified leg, initial encounter: Secondary | ICD-10-CM | POA: Diagnosis not present

## 2013-04-06 DIAGNOSIS — I70229 Atherosclerosis of native arteries of extremities with rest pain, unspecified extremity: Secondary | ICD-10-CM | POA: Diagnosis not present

## 2013-04-06 DIAGNOSIS — I739 Peripheral vascular disease, unspecified: Secondary | ICD-10-CM | POA: Diagnosis not present

## 2013-04-12 DIAGNOSIS — M2140 Flat foot [pes planus] (acquired), unspecified foot: Secondary | ICD-10-CM | POA: Diagnosis not present

## 2013-04-12 DIAGNOSIS — M19079 Primary osteoarthritis, unspecified ankle and foot: Secondary | ICD-10-CM | POA: Diagnosis not present

## 2013-04-12 DIAGNOSIS — M21619 Bunion of unspecified foot: Secondary | ICD-10-CM | POA: Diagnosis not present

## 2013-04-12 DIAGNOSIS — M2040 Other hammer toe(s) (acquired), unspecified foot: Secondary | ICD-10-CM | POA: Diagnosis not present

## 2013-04-12 DIAGNOSIS — M659 Synovitis and tenosynovitis, unspecified: Secondary | ICD-10-CM | POA: Diagnosis not present

## 2013-04-12 DIAGNOSIS — M2020 Hallux rigidus, unspecified foot: Secondary | ICD-10-CM | POA: Diagnosis not present

## 2013-04-12 DIAGNOSIS — M79609 Pain in unspecified limb: Secondary | ICD-10-CM | POA: Diagnosis not present

## 2013-06-03 ENCOUNTER — Encounter (INDEPENDENT_AMBULATORY_CARE_PROVIDER_SITE_OTHER): Payer: Self-pay | Admitting: Surgery

## 2013-06-03 ENCOUNTER — Ambulatory Visit (INDEPENDENT_AMBULATORY_CARE_PROVIDER_SITE_OTHER): Payer: Medicare Other | Admitting: Surgery

## 2013-06-03 VITALS — BP 144/90 | HR 84 | Temp 98.2°F | Resp 14 | Ht 65.0 in | Wt 158.2 lb

## 2013-06-03 DIAGNOSIS — R1310 Dysphagia, unspecified: Secondary | ICD-10-CM | POA: Diagnosis not present

## 2013-06-03 NOTE — Progress Notes (Signed)
Rachael [name] 80 y.o.  Body mass index is 26.33 kg/(m^2).  Patient Active Problem List   Diagnosis Date Noted  . S/P Nissen fundoplication April 2012 10/01/2012  . CAD (coronary artery disease)   . Hypothyroidism   . Hyperglycemia   . Depression   . Anemia   . Diverticulitis of colon   . Arthritis   . Gout   . Right knee DJD   . Supraventricular tachycardia 04/07/2012  . CAROTID ARTERY DISEASE 07/31/2010  . ANGINA, HX OF 07/30/2010  . HIATAL HERNIA 06/17/2010  . ANKLE PAIN, RIGHT 05/01/2010  . CONJUNCTIVITIS 02/08/2010  . BUNION, LEFT FOOT 08/23/2009  . DIVERTICULITIS OF COLON 08/21/2009  . UNSPEC SYMPTOM ASSOC W/FEMALE GENITAL ORGANS 12/05/2008  . ALLERGIC RHINITIS 08/15/2008  . CHEST WALL PAIN, ANTERIOR 08/15/2008  . HYPERGLYCEMIA 06/21/2008  . ACUTE BRONCHITIS 01/07/2008  . OSTEOARTHRITIS 11/25/2007  . FOOT PAIN 11/25/2007  . HYPOTHYROIDISM 04/27/2007  . HYPERLIPIDEMIA 04/27/2007  . ANEMIA-NOS 04/27/2007  . DEPRESSION 04/27/2007  . CORONARY ARTERY DISEASE 04/27/2007  . GERD 04/27/2007  . LOW BACK PAIN, CHRONIC 04/27/2007  . FATIGUE 04/27/2007    Allergies  Allergen Reactions  . Meperidine Hcl Other (See Comments)    Unknown   . Sulfonamide Derivatives Other (See Comments)    Happened about 15 years ago    Past Surgical History  Procedure Laterality Date  . Hiatal hernia repair  09-21-10    per Dr. Luretha MurphyMatthew Kodee Ravert, lap [employer]   . Hernia repair    . Abdominal hysterectomy    . Total knee arthroplasty  06/07/2012    Procedure: TOTAL KNEE ARTHROPLASTY;  Surgeon: Nilda Simmerobert A Wainer, MD;  [facility];  Service: Orthopedics;  Laterality: Right;  . Partial colectomy      for adhesions  . Colonoscopy  10-10-05    per Dr. Juanda ChanceBrodie, repeat in 10 yrs   . Colon surgery     FRY,STEPHEN A, MD 1. Dysphagia, unspecified(787.20)     Has reflux turned from her [place]. She is in long-term followup. She 8 she still has intermittent regurgitation  and dysphagia. We'll go ahead and set her up for an upper GI to assess this wrap. Her prior wrap we required mesh fascia a large hiatal hernia. We'll see her back after the upper GI. Matt B. Daphine DeutscherMartin, MD, [facility]  [facility] [phone] beeper [phone]  06/03/2013 11:08 AM

## 2013-06-03 NOTE — Patient Instructions (Signed)
Upper Gastrointestinal Series An upper gastrointestinal (GI) series is a medical test with X-rays that helps diagnose problems of the upper GI tract. The upper GI tract includes the esophagus, stomach, and duodenum. The duodenum is the first part of the small intestine. This exam is used to look for heartburn, reflux, sores (ulcers), abnormal growths, and other problems. LET YOUR CAREGIVER KNOW ABOUT:  Allergies to food or medicine.  Medicines taken, including vitamins, herbs, eyedrops, over-the-counter medicines, and creams.  Previous problems with barium.  Any constipation problems.  Breastfeeding, if this applies.  Possibility of pregnancy, if this applies. RISKS AND COMPLICATIONS Problems do not occur often with this procedure, but they are possible. Possible problems include:  An allergic reaction to barium (rare).  Nausea after drinking the barium.  Problems from the very small amount of radiation exposure.  Cramps or constipation. BEFORE THE PROCEDURE  Several days before the exam, you may need to change what you eat. Follow your caregiver's directions.  The night before the exam, do not eat or drink anything after midnight.  Ask your caregiver if it is okay to take any needed medicines with a sip of water.  If you have diabetes and need to take insulin, ask for instructions on how to do this before the exam. PROCEDURE An upper GI series is done while you are awake. You will not need pain medicine. The exam usually takes about 1 hour. The length of the exam depends on how long it takes for the barium to move through your system. It also depends on what is found during the exam.   You will drink barium. This is like a milkshake that tastes like chalk. It might make you feel bloated. Barium helps the inside of the upper GI tract to show up clearly on the screen.  You may also swallow "fizzies." This is a substance that causes air to build up in your stomach.  A type of  X-ray called fluoroscopy will be used. You may need to stand up or lie on a table. The table may move or tilt. You may need to turn from side to side. This is done to get pictures from different angles.  The exam is done when all the needed pictures have been taken. AFTER THE PROCEDURE  You may go back to your normal diet and activities right away.  Ask when your test results will be ready. Follow up with your caregiver to discuss your test results. Document Released: 05/09/2000 Document Revised: 08/04/2011 Document Reviewed: 03/24/2011 [facility] Patient Information 2014 [employer].

## 2013-06-07 ENCOUNTER — Other Ambulatory Visit: Payer: Medicare Other

## 2013-06-08 ENCOUNTER — Other Ambulatory Visit: Payer: Medicare Other

## 2013-06-14 ENCOUNTER — Ambulatory Visit
Admission: RE | Admit: 2013-06-14 | Discharge: 2013-06-14 | Disposition: A | Discharge status: Home / Self Care | Payer: Medicare Other | Source: Ambulatory Visit | Attending: Surgery | Admitting: Surgery

## 2013-06-14 DIAGNOSIS — K449 Diaphragmatic hernia without obstruction or gangrene: Secondary | ICD-10-CM | POA: Diagnosis not present

## 2013-06-14 DIAGNOSIS — R1310 Dysphagia, unspecified: Secondary | ICD-10-CM

## 2013-06-14 DIAGNOSIS — K219 Gastro-esophageal reflux disease without esophagitis: Secondary | ICD-10-CM | POA: Diagnosis not present

## 2013-06-24 ENCOUNTER — Ambulatory Visit (INDEPENDENT_AMBULATORY_CARE_PROVIDER_SITE_OTHER): Payer: Medicare Other | Admitting: Surgery

## 2013-07-13 ENCOUNTER — Ambulatory Visit (INDEPENDENT_AMBULATORY_CARE_PROVIDER_SITE_OTHER): Payer: Medicare Other | Admitting: Surgery

## 2013-07-13 ENCOUNTER — Encounter (INDEPENDENT_AMBULATORY_CARE_PROVIDER_SITE_OTHER): Payer: Medicare Other | Admitting: Surgery

## 2013-08-01 ENCOUNTER — Telehealth: Payer: Self-pay | Admitting: Family Medicine

## 2013-08-01 NOTE — Telephone Encounter (Signed)
Pt needs refill on ranitidine 150 mg #180 w/refills [facility] [address]

## 2013-08-03 MED ORDER — RANITIDINE HCL 150 MG PO TABS: 150.0000 mg | ORAL_TABLET | Freq: Two times a day (BID) | ORAL | Status: DC

## 2013-08-03 NOTE — Telephone Encounter (Signed)
I sent script e-scribe. 

## 2013-08-12 ENCOUNTER — Encounter (INDEPENDENT_AMBULATORY_CARE_PROVIDER_SITE_OTHER): Payer: Self-pay | Admitting: Surgery

## 2013-08-12 ENCOUNTER — Ambulatory Visit (INDEPENDENT_AMBULATORY_CARE_PROVIDER_SITE_OTHER): Payer: Medicare Other | Admitting: Surgery

## 2013-08-12 VITALS — BP 122/80 | HR 80 | Temp 99.2°F | Resp 14 | Ht 65.0 in | Wt 161.6 lb

## 2013-08-12 DIAGNOSIS — K2100 Gastro-esophageal reflux disease with esophagitis, without bleeding: Secondary | ICD-10-CM | POA: Diagnosis not present

## 2013-08-12 DIAGNOSIS — Z9889 Other specified postprocedural states: Secondary | ICD-10-CM

## 2013-08-12 NOTE — Patient Instructions (Signed)
Gastrointestinal Endoscopy °Care After °Refer to this sheet in the next few weeks. These instructions provide you with information on caring for yourself after your procedure. Your caregiver may also give you more specific instructions. Your treatment has been planned according to current medical practices, but problems sometimes occur. Call your caregiver if you have any problems or questions after your procedure. °HOME CARE INSTRUCTIONS °· If you were given medicine to help you relax (sedative), do not drive, operate machinery, or sign important documents for 24 hours. °· Avoid alcohol and hot or warm beverages for the first 24 hours after the procedure. °· Only take over-the-counter or prescription medicines for pain, discomfort, or fever as directed by your caregiver. You may resume taking your normal medicines unless your caregiver tells you otherwise. Ask your caregiver when you may resume taking medicines that may cause bleeding, such as aspirin, clopidogrel, or warfarin. °· You may return to your normal diet and activities on the day after your procedure, or as directed by your caregiver. Walking may help to reduce any bloated feeling in your abdomen. °· Drink enough fluids to keep your urine clear or pale yellow. °· You may gargle with salt water if you have a sore throat. °SEEK IMMEDIATE MEDICAL CARE IF: °· You have severe nausea or vomiting. °· You have severe abdominal pain, abdominal cramps that last longer than 6 hours, or abdominal swelling (distention). °· You have severe shoulder or back pain. °· You have trouble swallowing. °· You have shortness of breath, your breathing is shallow, or you are breathing faster than normal. °· You have a fever or a rapid heartbeat. °· You vomit blood or material that looks like coffee grounds. °· You have bloody, black, or tarry stools. °MAKE SURE YOU: °· Understand these instructions. °· Will watch your condition. °· Will get help right away if you are not doing  well or get worse. °Document Released: 12/25/2003 Document Revised: 11/11/2011 Document Reviewed: 08/12/2011 °ExitCare® Patient Information ©2014 [employer]. ° °

## 2013-08-12 NOTE — Progress Notes (Signed)
Rachael [name] 80 y.o.  Body mass index is 26.89 kg/(m^2).  Patient Active Problem List   Diagnosis Date Noted  . S/P Nissen fundoplication April 2012 10/01/2012  . CAD (coronary artery disease)   . Hypothyroidism   . Hyperglycemia   . Depression   . Anemia   . Diverticulitis of colon   . Arthritis   . Gout   . Right knee DJD   . Supraventricular tachycardia 04/07/2012  . CAROTID ARTERY DISEASE 07/31/2010  . ANGINA, HX OF 07/30/2010  . HIATAL HERNIA 06/17/2010  . ANKLE PAIN, RIGHT 05/01/2010  . CONJUNCTIVITIS 02/08/2010  . BUNION, LEFT FOOT 08/23/2009  . DIVERTICULITIS OF COLON 08/21/2009  . UNSPEC SYMPTOM ASSOC W/FEMALE GENITAL ORGANS 12/05/2008  . ALLERGIC RHINITIS 08/15/2008  . CHEST WALL PAIN, ANTERIOR 08/15/2008  . HYPERGLYCEMIA 06/21/2008  . ACUTE BRONCHITIS 01/07/2008  . OSTEOARTHRITIS 11/25/2007  . FOOT PAIN 11/25/2007  . HYPOTHYROIDISM 04/27/2007  . HYPERLIPIDEMIA 04/27/2007  . ANEMIA-NOS 04/27/2007  . DEPRESSION 04/27/2007  . CORONARY ARTERY DISEASE 04/27/2007  . GERD 04/27/2007  . LOW BACK PAIN, CHRONIC 04/27/2007  . FATIGUE 04/27/2007    Allergies  Allergen Reactions  . Meperidine Hcl Other (See Comments)    Unknown   . Sulfonamide Derivatives Other (See Comments)    Happened about 15 years ago    Past Surgical History  Procedure Laterality Date  . Hiatal hernia repair  09-21-10    per Dr. Luretha MurphyMatthew Shiane Wenberg, lap [employer]   . Hernia repair    . Abdominal hysterectomy    . Total knee arthroplasty  06/07/2012    Procedure: TOTAL KNEE ARTHROPLASTY;  Surgeon: Nilda Simmerobert A Wainer, MD;  [facility];  Service: Orthopedics;  Laterality: Right;  . Partial colectomy      for adhesions  . Colonoscopy  10-10-05    per Dr. Juanda ChanceBrodie, repeat in 10 yrs   . Colon surgery     FRY,STEPHEN A, MD No diagnosis found. Three years out from her repair of a large type III mixed hiatus hernia with pledgeted repair and biologic mesh placement.  Review of her UGI shows reflux  and migration of stomach above the diaphragm.  I have discussed upper endoscopy with Dr. Ezzard StandingNewman and myself and we will look at this and see what we can do it continued to approach this surgically to repair it.  Plan upper endoscopy Matt B. Daphine DeutscherMartin, MD, [facility]  [facility] [phone] beeper [phone]  08/12/2013 10:20 AM

## 2013-08-24 ENCOUNTER — Telehealth: Payer: Self-pay | Admitting: Family Medicine

## 2013-08-24 NOTE — Telephone Encounter (Signed)
Pt would like referral for cataract surgery. Pt states you are aware of her issue.

## 2013-08-25 ENCOUNTER — Other Ambulatory Visit (INDEPENDENT_AMBULATORY_CARE_PROVIDER_SITE_OTHER): Payer: Self-pay | Admitting: Surgery

## 2013-08-25 NOTE — Telephone Encounter (Signed)
I left a voice message for pt to call us back and give more information. I advised that Dr. Clent RidgesFry would be out of the office next week.

## 2013-08-25 NOTE — Telephone Encounter (Signed)
I have no idea what this means. I believe she already sees an Ophthalmologist. Please get more information about this

## 2013-08-29 ENCOUNTER — Ambulatory Visit (HOSPITAL_COMMUNITY)
Admission: RE | Admit: 2013-08-29 | Discharge: 2013-08-29 | Disposition: A | Discharge status: Home / Self Care | Payer: Medicare Other | Source: Ambulatory Visit | Attending: Surgery | Admitting: Surgery

## 2013-08-29 ENCOUNTER — Telehealth: Payer: Self-pay | Admitting: Family Medicine

## 2013-08-29 ENCOUNTER — Encounter (HOSPITAL_COMMUNITY)
Admission: RE | Disposition: A | Discharge status: Home / Self Care | Payer: Self-pay | Source: Ambulatory Visit | Attending: Surgery

## 2013-08-29 ENCOUNTER — Encounter (HOSPITAL_COMMUNITY): Payer: Self-pay | Admitting: *Deleted

## 2013-08-29 DIAGNOSIS — Z9049 Acquired absence of other specified parts of digestive tract: Secondary | ICD-10-CM | POA: Diagnosis not present

## 2013-08-29 DIAGNOSIS — E785 Hyperlipidemia, unspecified: Secondary | ICD-10-CM | POA: Insufficient documentation

## 2013-08-29 DIAGNOSIS — K449 Diaphragmatic hernia without obstruction or gangrene: Secondary | ICD-10-CM

## 2013-08-29 DIAGNOSIS — Z96659 Presence of unspecified artificial knee joint: Secondary | ICD-10-CM | POA: Insufficient documentation

## 2013-08-29 DIAGNOSIS — E039 Hypothyroidism, unspecified: Secondary | ICD-10-CM | POA: Insufficient documentation

## 2013-08-29 DIAGNOSIS — Z90710 Acquired absence of both cervix and uterus: Secondary | ICD-10-CM | POA: Diagnosis not present

## 2013-08-29 DIAGNOSIS — M109 Gout, unspecified: Secondary | ICD-10-CM | POA: Diagnosis not present

## 2013-08-29 DIAGNOSIS — K219 Gastro-esophageal reflux disease without esophagitis: Secondary | ICD-10-CM | POA: Insufficient documentation

## 2013-08-29 DIAGNOSIS — A048 Other specified bacterial intestinal infections: Secondary | ICD-10-CM

## 2013-08-29 DIAGNOSIS — I2510 Atherosclerotic heart disease of native coronary artery without angina pectoris: Secondary | ICD-10-CM | POA: Diagnosis not present

## 2013-08-29 DIAGNOSIS — I779 Disorder of arteries and arterioles, unspecified: Secondary | ICD-10-CM | POA: Diagnosis not present

## 2013-08-29 DIAGNOSIS — H269 Unspecified cataract: Secondary | ICD-10-CM

## 2013-08-29 HISTORY — PX: ESOPHAGOGASTRODUODENOSCOPY: SHX5428

## 2013-08-29 SURGERY — EGD (ESOPHAGOGASTRODUODENOSCOPY)
Anesthesia: Moderate Sedation

## 2013-08-29 MED ORDER — FENTANYL CITRATE 0.05 MG/ML IJ SOLN
INTRAMUSCULAR | Status: DC | PRN
  Administered 2013-08-29: 25 ug via INTRAVENOUS

## 2013-08-29 MED ORDER — MIDAZOLAM HCL 10 MG/2ML IJ SOLN
INTRAMUSCULAR | Status: AC
  Filled 2013-08-29: qty 2

## 2013-08-29 MED ORDER — DIPHENHYDRAMINE HCL 50 MG/ML IJ SOLN
INTRAMUSCULAR | Status: AC
  Filled 2013-08-29: qty 1

## 2013-08-29 MED ORDER — SODIUM CHLORIDE 0.9 % IV SOLN
INTRAVENOUS | Status: DC
  Administered 2013-08-29: 500 mL via INTRAVENOUS

## 2013-08-29 MED ORDER — FENTANYL CITRATE 0.05 MG/ML IJ SOLN
INTRAMUSCULAR | Status: AC
  Filled 2013-08-29: qty 2

## 2013-08-29 MED ORDER — MIDAZOLAM HCL 10 MG/2ML IJ SOLN
INTRAMUSCULAR | Status: DC | PRN
  Administered 2013-08-29 (×2): 2 mg via INTRAVENOUS

## 2013-08-29 MED ORDER — BUTAMBEN-TETRACAINE-BENZOCAINE 2-2-14 % EX AERO
INHALATION_SPRAY | CUTANEOUS | Status: DC | PRN
  Administered 2013-08-29: 3 via TOPICAL

## 2013-08-29 NOTE — Telephone Encounter (Signed)
Pt called back and left a voice message. She needs the referral for eye doctor, would like to see someone here in [place].

## 2013-08-29 NOTE — Discharge Instructions (Signed)
Gastrointestinal Endoscopy °Care After °Refer to this sheet in the next few weeks. These instructions provide you with information on caring for yourself after your procedure. Your caregiver may also give you more specific instructions. Your treatment has been planned according to current medical practices, but problems sometimes occur. Call your caregiver if you have any problems or questions after your procedure. °HOME CARE INSTRUCTIONS °· If you were given medicine to help you relax (sedative), do not drive, operate machinery, or sign important documents for 24 hours. °· Avoid alcohol and hot or warm beverages for the first 24 hours after the procedure. °· Only take over-the-counter or prescription medicines for pain, discomfort, or fever as directed by your caregiver. You may resume taking your normal medicines unless your caregiver tells you otherwise. Ask your caregiver when you may resume taking medicines that may cause bleeding, such as aspirin, clopidogrel, or warfarin. °· You may return to your normal diet and activities on the day after your procedure, or as directed by your caregiver. Walking may help to reduce any bloated feeling in your abdomen. °· Drink enough fluids to keep your urine clear or pale yellow. °· You may gargle with salt water if you have a sore throat. °SEEK IMMEDIATE MEDICAL CARE IF: °· You have severe nausea or vomiting. °· You have severe abdominal pain, abdominal cramps that last longer than 6 hours, or abdominal swelling (distention). °· You have severe shoulder or back pain. °· You have trouble swallowing. °· You have shortness of breath, your breathing is shallow, or you are breathing faster than normal. °· You have a fever or a rapid heartbeat. °· You vomit blood or material that looks like coffee grounds. °· You have bloody, black, or tarry stools. °MAKE SURE YOU: °· Understand these instructions. °· Will watch your condition. °· Will get help right away if you are not doing  well or get worse. °Document Released: 12/25/2003 Document Revised: 11/11/2011 Document Reviewed: 08/12/2011 °ExitCare® Patient Information ©2014 [employer]. ° °

## 2013-08-29 NOTE — H&P (View-Only) (Signed)
Rachael [name] 80 y.o.  Body mass index is 26.89 kg/(m^2).  Patient Active Problem List   Diagnosis Date Noted  . S/P Nissen fundoplication April 2012 10/01/2012  . CAD (coronary artery disease)   . Hypothyroidism   . Hyperglycemia   . Depression   . Anemia   . Diverticulitis of colon   . Arthritis   . Gout   . Right knee DJD   . Supraventricular tachycardia 04/07/2012  . CAROTID ARTERY DISEASE 07/31/2010  . ANGINA, HX OF 07/30/2010  . HIATAL HERNIA 06/17/2010  . ANKLE PAIN, RIGHT 05/01/2010  . CONJUNCTIVITIS 02/08/2010  . BUNION, LEFT FOOT 08/23/2009  . DIVERTICULITIS OF COLON 08/21/2009  . UNSPEC SYMPTOM ASSOC W/FEMALE GENITAL ORGANS 12/05/2008  . ALLERGIC RHINITIS 08/15/2008  . CHEST WALL PAIN, ANTERIOR 08/15/2008  . HYPERGLYCEMIA 06/21/2008  . ACUTE BRONCHITIS 01/07/2008  . OSTEOARTHRITIS 11/25/2007  . FOOT PAIN 11/25/2007  . HYPOTHYROIDISM 04/27/2007  . HYPERLIPIDEMIA 04/27/2007  . ANEMIA-NOS 04/27/2007  . DEPRESSION 04/27/2007  . CORONARY ARTERY DISEASE 04/27/2007  . GERD 04/27/2007  . LOW BACK PAIN, CHRONIC 04/27/2007  . FATIGUE 04/27/2007    Allergies  Allergen Reactions  . Meperidine Hcl Other (See Comments)    Unknown   . Sulfonamide Derivatives Other (See Comments)    Happened about 15 years ago    Past Surgical History  Procedure Laterality Date  . Hiatal hernia repair  09-21-10    per Dr. Kaoru Benda, lap [employer]   . Hernia repair    . Abdominal hysterectomy    . Total knee arthroplasty  06/07/2012    Procedure: TOTAL KNEE ARTHROPLASTY;  Surgeon: Robert A Wainer, MD;  Location: [facility];  Service: Orthopedics;  Laterality: Right;  . Partial colectomy      for adhesions  . Colonoscopy  10-10-05    per Dr. Brodie, repeat in 10 yrs   . Colon surgery     FRY,STEPHEN A, MD No diagnosis found. Three years out from her repair of a large type III mixed hiatus hernia with pledgeted repair and biologic mesh placement.  Review of her UGI shows reflux  and migration of stomach above the diaphragm.  I have discussed upper endoscopy with Dr. Newman and myself and we will look at this and see what we can do it continued to approach this surgically to repair it.  Plan upper endoscopy Matt B. Maelani Yarbro, MD, FACS  [facility] [phone] beeper [phone]  08/12/2013 10:20 AM   

## 2013-08-29 NOTE — Interval H&P Note (Signed)
History and Physical Interval Note:  08/29/2013 11:11 AM  Rachael [name]  has presented today for surgery, with the diagnosis of recurrent reflux after hiatus hernia repair.  The various methods of treatment have been discussed with the patient and family.  Daughter, Frederich ChaDebbie Smith, is available by phone.  Cloyde ReamsLarry Weabil, will pick her up.  After consideration of risks, benefits and other options for treatment, the patient has consented to  Procedure(s): ESOPHAGOGASTRODUODENOSCOPY (EGD) (N/A) as a surgical intervention .  The patient's history has been reviewed, patient examined, no change in status, stable for surgery.  I have reviewed the patient's chart and labs.  Questions were answered to the patient's satisfaction.     [name] H

## 2013-08-29 NOTE — Op Note (Addendum)
08/29/2013  11:54 AM  PATIENT:  Rachael [name], 80 y.o., female, MRN: [id number]  PREOP DIAGNOSIS:  Hiatal hernia  POSTOP DIAGNOSIS:   Hiatal hernia, approx. 5 cm  PROCEDURE:  Esophagogastroduedonoscopy  SURGEON:   Ovidio Kinavid Brody Kump, M.D.  ANESTHESIA:   Fentanyl  25 mcg   Versed 4 mg  INDICATIONS FOR PROCEDURE:  Rachael [name] is a 80 y.o. (DOB: 05/07/1931)  white  female whose primary care physician is FRY,STEPHEN A, MD and comes for upper endoscopy to evaluate prior hiatal hernia repair.  Dr. Daphine DeutscherMartin did a repair of a hiatal hernia on 09/20/2010.  She has had increasing reflux symptoms, though they are fairly well controlled on Ranitidine.  An UGI on 06/14/2013 shows a small to moderate HH.   The indications and risks of the endoscopy were explained to the patient.  The risks include, but are not limited to, perforation, bleeding, or injury to the bowel.  PROCEDURE:  The patient was monitored with a pulse oximetry, BP cuff, and EKG.  The patient has nasal O2 flowing during the procedure.   The back of the throat was anesthestized with Ceticaine.  A flexible Pentax endoscope was passed down the throat without difficulty.  Findings include:   Esophagus:  normal   GE junction at:  33 cm cm   Stomach:  Hiatus appears at 38 cm, with part of wrap above the hiatus.  But gastric mucosa looks okay without ulcer.   Biopsy of stomach taken for CLO   Duodenum:   Normal  PLAN:  Dr. Daphine DeutscherMartin was present during the operation and has added Nexium to her meds.  Ovidio Kinavid Jaeceon Michelin, MD, [facility] Pager: [phone] Office phone:  [phone]

## 2013-08-30 ENCOUNTER — Encounter (HOSPITAL_COMMUNITY): Payer: Self-pay | Admitting: Surgery

## 2013-08-30 LAB — CLOTEST (H. PYLORI), BIOPSY: Helicobacter screen: POSITIVE — AB

## 2013-08-31 ENCOUNTER — Telehealth (INDEPENDENT_AMBULATORY_CARE_PROVIDER_SITE_OTHER): Payer: Self-pay

## 2013-08-31 DIAGNOSIS — A048 Other specified bacterial intestinal infections: Secondary | ICD-10-CM

## 2013-08-31 MED ORDER — AMOXICILL-CLARITHRO-LANSOPRAZ PO MISC: Freq: Two times a day (BID) | ORAL | Status: DC

## 2013-08-31 NOTE — Telephone Encounter (Signed)
Called and spoke to patient to make aware of positive H-Pylori results from recent EGD.  Reviewed with Dr. Daphine DeutscherMartin, okay to call in Prev Pac.  Patient had several questions that I will review with Dr. Daphine DeutscherMartin and call tomorrow to discuss.  Patient not sure if she need's to take Nexium with the prev pac or hold until she has completed it or take with.  Patient advised that I will review in the morning and call to discuss in further detail.  Patient verbalized understanding.

## 2013-08-31 NOTE — Telephone Encounter (Signed)
Message copied by Maryan PulsMOORE, Carolin Quang on Wed Aug 31, 2013  5:33 PM ------      Message from: Joanette GulaSMITHEY, CYNTHIA      Created: Tue Aug 30, 2013  4:32 PM      Regarding: FW: Please note Ms. Bennis's CLO test is positive.                   ----- Message -----         From: Kandis Cockingavid H Newman, MD         Sent: 08/30/2013   4:20 PM           To: Valarie MerinoMatthew B Martin, MD, [facility] Clinical Pool      Subject: Please note Ms. Chern's CLO test is positiv#            Matt,      Ms. Grandt's CLO test was positive.      At least it will give you something to treat and maybe help her symptoms.            Let me know if you have any question.            Onalee Huaavid                  ----- Message -----         From: [facility] [place]         Sent: 08/30/2013   2:07 PM           To: Kandis Cockingavid H Newman, MD                   ------

## 2013-08-31 NOTE — Telephone Encounter (Signed)
Message copied by Maryan PulsMOORE, Sricharan Lacomb on Wed Aug 31, 2013  5:51 PM ------      Message from: Joanette GulaSMITHEY, CYNTHIA      Created: Tue Aug 30, 2013  4:32 PM      Regarding: FW: Please note Ms. Denny's CLO test is positive.                   ----- Message -----         From: Kandis Cockingavid H Newman, MD         Sent: 08/30/2013   4:20 PM           To: Valarie MerinoMatthew B Martin, MD, [facility] Clinical Pool      Subject: Please note Ms. Zobrist's CLO test is positiv#            Matt,      Ms. Trueba's CLO test was positive.      At least it will give you something to treat and maybe help her symptoms.            Let me know if you have any question.            Onalee Huaavid                  ----- Message -----         From: [facility] [place]         Sent: 08/30/2013   2:07 PM           To: Kandis Cockingavid H Newman, MD                   ------

## 2013-09-01 ENCOUNTER — Telehealth (INDEPENDENT_AMBULATORY_CARE_PROVIDER_SITE_OTHER): Payer: Self-pay

## 2013-09-01 NOTE — Telephone Encounter (Signed)
Message copied by Maryan PulsMOORE, Harith Mccadden on Thu Sep 01, 2013  2:35 PM ------      Message from: Joanette GulaSMITHEY, CYNTHIA      Created: Tue Aug 30, 2013  4:32 PM      Regarding: FW: Please note Ms. Gassman's CLO test is positive.                   ----- Message -----         From: Kandis Cockingavid H Newman, MD         Sent: 08/30/2013   4:20 PM           To: Valarie MerinoMatthew B Martin, MD, [facility] Clinical Pool      Subject: Please note Ms. Sanford's CLO test is positiv#            Matt,      Ms. Maestre's CLO test was positive.      At least it will give you something to treat and maybe help her symptoms.            Let me know if you have any question.            Onalee Huaavid                  ----- Message -----         From: [facility] [place]         Sent: 08/30/2013   2:07 PM           To: Kandis Cockingavid H Newman, MD                   ------

## 2013-09-01 NOTE — Telephone Encounter (Signed)
Called [facility] to have Prev Pac ordered for patient.  Order placed in EPIC on 08/31/13 but, it did not go through electronically.  Spoke to pharmacist and they will get prescription filled and ready for patient, if cost is the same for brand rx they will not break down the prescription individually.

## 2013-09-02 ENCOUNTER — Telehealth (INDEPENDENT_AMBULATORY_CARE_PROVIDER_SITE_OTHER): Payer: Self-pay

## 2013-09-02 ENCOUNTER — Other Ambulatory Visit (INDEPENDENT_AMBULATORY_CARE_PROVIDER_SITE_OTHER): Payer: Self-pay

## 2013-09-02 DIAGNOSIS — A048 Other specified bacterial intestinal infections: Secondary | ICD-10-CM

## 2013-09-02 MED ORDER — LANSOPRAZOLE 30 MG PO CPDR: 30.0000 mg | DELAYED_RELEASE_CAPSULE | Freq: Two times a day (BID) | ORAL | Status: DC

## 2013-09-02 MED ORDER — CLARITHROMYCIN 500 MG PO TABS: 500.0000 mg | ORAL_TABLET | Freq: Two times a day (BID) | ORAL | Status: DC

## 2013-09-02 MED ORDER — AMOXICILLIN 500 MG PO CAPS: 1000.0000 mg | ORAL_CAPSULE | Freq: Two times a day (BID) | ORAL | Status: DC

## 2013-09-02 NOTE — Telephone Encounter (Signed)
Patient called in needing prev pac broken down since pharmacy is out of stock. She would like to pick script up. Spoke to Declohristy, she get scripts ready and I will call pt once they are ready for pick up.

## 2013-09-06 NOTE — Telephone Encounter (Signed)
I spoke with pt  

## 2013-09-06 NOTE — Telephone Encounter (Signed)
Both referrals were done  

## 2013-09-06 NOTE — Telephone Encounter (Signed)
I need to know what I am referring her for. Why does she see an eye doctor, and who is she seeing now?

## 2013-09-06 NOTE — Telephone Encounter (Signed)
I spoke with pt and she was diagnosed with Cataracts about 5-7 years ago by a physician in [place]. Also she would like a referral for GI, she saw Dr. Daphine DeutscherMartin last week and now has H pylori, can we do a referral for this also?

## 2013-09-20 ENCOUNTER — Ambulatory Visit (INDEPENDENT_AMBULATORY_CARE_PROVIDER_SITE_OTHER): Payer: Medicare Other | Admitting: Surgery

## 2013-09-20 ENCOUNTER — Encounter (INDEPENDENT_AMBULATORY_CARE_PROVIDER_SITE_OTHER): Payer: Self-pay | Admitting: Surgery

## 2013-09-20 VITALS — BP 122/84 | HR 75 | Temp 98.3°F | Resp 14 | Ht 65.0 in | Wt 160.4 lb

## 2013-09-20 DIAGNOSIS — A048 Other specified bacterial intestinal infections: Secondary | ICD-10-CM

## 2013-09-20 MED ORDER — LANSOPRAZOLE 30 MG PO CPDR: 30.0000 mg | DELAYED_RELEASE_CAPSULE | Freq: Every morning | ORAL | Status: DC

## 2013-09-20 NOTE — Progress Notes (Signed)
Rachael [name] 80 y.o.  Body mass index is 26.69 kg/(m^2).  Patient Active Problem List   Diagnosis Date Noted  . S/P Nissen fundoplication April 2012 10/01/2012  . CAD (coronary artery disease)   . Hypothyroidism   . Hyperglycemia   . Depression   . Anemia   . Diverticulitis of colon   . Arthritis   . Gout   . Right knee DJD   . Supraventricular tachycardia 04/07/2012  . CAROTID ARTERY DISEASE 07/31/2010  . ANGINA, HX OF 07/30/2010  . HIATAL HERNIA 06/17/2010  . ANKLE PAIN, RIGHT 05/01/2010  . CONJUNCTIVITIS 02/08/2010  . BUNION, LEFT FOOT 08/23/2009  . DIVERTICULITIS OF COLON 08/21/2009  . UNSPEC SYMPTOM ASSOC W/FEMALE GENITAL ORGANS 12/05/2008  . ALLERGIC RHINITIS 08/15/2008  . CHEST WALL PAIN, ANTERIOR 08/15/2008  . HYPERGLYCEMIA 06/21/2008  . ACUTE BRONCHITIS 01/07/2008  . OSTEOARTHRITIS 11/25/2007  . FOOT PAIN 11/25/2007  . HYPOTHYROIDISM 04/27/2007  . HYPERLIPIDEMIA 04/27/2007  . ANEMIA-NOS 04/27/2007  . DEPRESSION 04/27/2007  . CORONARY ARTERY DISEASE 04/27/2007  . GERD 04/27/2007  . LOW BACK PAIN, CHRONIC 04/27/2007  . FATIGUE 04/27/2007    Allergies  Allergen Reactions  . Meperidine Hcl Other (See Comments)    Unknown   . Sulfonamide Derivatives Other (See Comments)    Happened about 15 years ago    Past Surgical History  Procedure Laterality Date  . Hiatal hernia repair  09-21-10    per Dr. Luretha MurphyMatthew Myna Freimark, lap [employer]   . Hernia repair    . Abdominal hysterectomy    . Total knee arthroplasty  06/07/2012    Procedure: TOTAL KNEE ARTHROPLASTY;  Surgeon: Nilda Simmerobert A Wainer, MD;  [facility];  Service: Orthopedics;  Laterality: Right;  . Partial colectomy      for adhesions  . Colonoscopy  10-10-05    per Dr. Juanda ChanceBrodie, repeat in 10 yrs   . Colon surgery    . Esophagogastroduodenoscopy N/A 08/29/2013    Procedure: ESOPHAGOGASTRODUODENOSCOPY (EGD);  Surgeon: Kandis Cockingavid H Newman, MD;  [facility];  Service: General;  Laterality: N/A;   Nelwyn SalisburyFRY,STEPHEN  A, MD 1. H. pylori infection     Has completed therapy for the H pylori infection.  She is doing better..still worried that she has cancer somewhere.  She has not experienced weight loss.  She had multiple BMs today and noticed a little blood on her tissue.  I performed a rectal exam and this showed a small hemorrhoid and this is likely source of her rectal bleeding.   Will keep on Prevacid 30 mg Qd for the near future.  She will need to be treated with PPI to control her symptoms although they may be much better after successful treatment for h pylori.   Return 3 months.   Matt B. Daphine DeutscherMartin, MD, [facility]  [facility] [phone] beeper [phone]  09/20/2013 3:19 PM

## 2013-09-20 NOTE — Patient Instructions (Addendum)
Take the Prevacid 30 mg QD for 3 months.

## 2013-09-21 ENCOUNTER — Other Ambulatory Visit (INDEPENDENT_AMBULATORY_CARE_PROVIDER_SITE_OTHER): Payer: Medicare Other

## 2013-09-21 ENCOUNTER — Telehealth: Payer: Self-pay | Admitting: Internal Medicine

## 2013-09-21 ENCOUNTER — Ambulatory Visit (INDEPENDENT_AMBULATORY_CARE_PROVIDER_SITE_OTHER): Payer: Medicare Other | Admitting: Physician Assistant

## 2013-09-21 ENCOUNTER — Encounter: Payer: Self-pay | Admitting: Physician Assistant

## 2013-09-21 ENCOUNTER — Encounter: Payer: Self-pay | Admitting: Family Medicine

## 2013-09-21 ENCOUNTER — Ambulatory Visit (INDEPENDENT_AMBULATORY_CARE_PROVIDER_SITE_OTHER): Payer: Medicare Other | Admitting: Family Medicine

## 2013-09-21 VITALS — BP 104/66 | HR 88 | Ht 62.0 in | Wt 161.0 lb

## 2013-09-21 VITALS — BP 148/89 | HR 80 | Temp 99.0°F | Ht 65.0 in | Wt 160.0 lb

## 2013-09-21 DIAGNOSIS — K625 Hemorrhage of anus and rectum: Secondary | ICD-10-CM

## 2013-09-21 DIAGNOSIS — R1012 Left upper quadrant pain: Secondary | ICD-10-CM

## 2013-09-21 DIAGNOSIS — K449 Diaphragmatic hernia without obstruction or gangrene: Secondary | ICD-10-CM

## 2013-09-21 LAB — CBC WITH DIFFERENTIAL/PLATELET
BASOS PCT: 0.6 % (ref 0.0–3.0)
Basophils Absolute: 0.1 10*3/uL (ref 0.0–0.1)
Eosinophils Absolute: 0.3 10*3/uL (ref 0.0–0.7)
Eosinophils Relative: 3.7 % (ref 0.0–5.0)
HCT: 43.8 % (ref 36.0–46.0)
HEMOGLOBIN: 14.4 g/dL (ref 12.0–15.0)
Lymphocytes Relative: 30.7 % (ref 12.0–46.0)
Lymphs Abs: 2.9 10*3/uL (ref 0.7–4.0)
MCHC: 32.9 g/dL (ref 30.0–36.0)
MCV: 88.1 fl (ref 78.0–100.0)
Monocytes Absolute: 0.7 10*3/uL (ref 0.1–1.0)
Monocytes Relative: 7.6 % (ref 3.0–12.0)
NEUTROS ABS: 5.4 10*3/uL (ref 1.4–7.7)
NEUTROS PCT: 57.4 % (ref 43.0–77.0)
Platelets: 303 10*3/uL (ref 150.0–400.0)
RBC: 4.98 Mil/uL (ref 3.87–5.11)
RDW: 15 % — ABNORMAL HIGH (ref 11.5–14.6)
WBC: 9.4 10*3/uL (ref 4.5–10.5)

## 2013-09-21 LAB — BASIC METABOLIC PANEL
BUN: 17 mg/dL (ref 6–23)
CHLORIDE: 108 meq/L (ref 96–112)
CO2: 29 mEq/L (ref 19–32)
Calcium: 9.5 mg/dL (ref 8.4–10.5)
Creatinine, Ser: 0.9 mg/dL (ref 0.4–1.2)
GFR: 63.03 mL/min (ref >60.00)
Glucose, Bld: 94 mg/dL (ref 70–99)
POTASSIUM: 4.2 meq/L (ref 3.5–5.1)
Sodium: 143 mEq/L (ref 135–145)

## 2013-09-21 NOTE — Patient Instructions (Signed)
Please go to the basement level to have your labs drawn.  Take Zantac ( Ranitidine) 1 tab or capsule twice daily.   You have been scheduled for a CT scan of the abdomen and pelvis at [facility] ([address]---this is in the same building as Press photographer).   You are scheduled on 09-22-2013 at 4:00 Pm . You should arrive at 3:45 PM  prior to your appointment time for registration. Please follow the written instructions below on the day of your exam:  WARNING: IF YOU ARE ALLERGIC TO IODINE/X-RAY DYE, PLEASE NOTIFY RADIOLOGY IMMEDIATELY AT [phone]! YOU WILL BE GIVEN A 13 HOUR PREMEDICATION PREP.  1) Do not eat or drink anything after 12:00 Noon (4 hours prior to your test) 2) You have been given 2 bottles of oral contrast to drink. The solution may taste better if refrigerated, but do NOT add ice or any other liquid to this solution. Shake well before drinking.    Drink 1 bottle of contrast @ 2:00 PM  (2 hours prior to your exam)  Drink 1 bottle of contrast @ 3:00 PM  (1 hour prior to your exam)  You may take any medications as prescribed with a small amount of water except for the following: Metformin, Glucophage, Glucovance, Avandamet, Riomet, Fortamet, Actoplus Met, Janumet, Glumetza or Metaglip. The above medications must be held the day of the exam AND 48 hours after the exam.  The purpose of you drinking the oral contrast is to aid in the visualization of your intestinal tract. The contrast solution may cause some diarrhea. Before your exam is started, you will be given a small amount of fluid to drink. Depending on your individual set of symptoms, you may also receive an intravenous injection of x-ray contrast/dye. Plan on being at [facility] for 30 minutes or long, depending on the type of exam you are having performed.  If you have any questions regarding your exam or if you need to reschedule, you may call the CT department at [phone] between the  hours of 8:00 am and 5:00 pm, Monday-Friday.  ________________________________________________________________________

## 2013-09-21 NOTE — Progress Notes (Signed)
Pre visit review using our clinic review tool, if applicable. No additional management support is needed unless otherwise documented below in the visit note. 

## 2013-09-21 NOTE — Progress Notes (Signed)
Reviewed, I saw her in 2009 for rectal pain, had prior hemorrhoidectomy. Agree with plans, may need colonoscopy to assess further.

## 2013-09-21 NOTE — Telephone Encounter (Signed)
Left message to call back.  Requesting pt be seen sooner than scheduled appt on Friday for rectal bleeding. Pt scheduled to see Mike GipAmy Esterwood PA today at 3pm. Gavin PoundDeborah to notify pt of appt date and time.

## 2013-09-21 NOTE — Progress Notes (Signed)
   Subjective:    Patient ID: Rachael [name], female    DOB: 06/16/1930, 80 y.o.   MRN: [id number]  HPI Here for 2 days of rectal bleeding and frequent small loose BMs. No fever. She saw Dr. Daphine DeutscherMartin yesterday for a routine follow up and mentioned to him she had seen some blood on the tissue after wiping. He examined her and found a small hemorrhoid, so he assumed the bleeding was from this. However last night and this morning she has had more oozing of blood and some small loose stools. She has mild abdominal cramps but no fever. She has extensive diverticulosis by CT scan in April 2014. Her last colonoscopy was in 2007.    Review of Systems  Constitutional: Negative.   Gastrointestinal: Positive for abdominal pain, blood in stool and anal bleeding. Negative for nausea, vomiting, diarrhea, constipation, abdominal distention and rectal pain.       Objective:   Physical Exam  Constitutional: She appears well-developed and well-nourished. No distress.  Cardiovascular: Normal rate, regular rhythm, normal heart sounds and intact distal pulses.   Pulmonary/Chest: Effort normal and breath sounds normal.  Abdominal: Soft. Bowel sounds are normal. She exhibits no distension and no mass. There is no rebound and no guarding.  Mild bilateral lower quadrant tenderness  Genitourinary:  Rectal exam is normal with no hemorrhoids and no active bleeding seen          Assessment & Plan:  Probable diverticular bleeding. She is stable for now. No evidence of infection. We will have her see the GI clinic ASAP

## 2013-09-21 NOTE — Progress Notes (Signed)
Subjective:    Patient ID: Rachael [name], female    DOB: [birth year], 80 y.o.   MRN: [id number]  HPI Rachael [name] is a pleasant 80 year old white female known remotely to Dr. Delfin Edis. She was last seen here in 2007 when she had a colonoscopy. She was found to have extensive diverticulosis in the sigmoid colon and internal hemorrhoids. Patient has history of coronary artery disease, hypothyroidism, carotid artery disease, osteoarthritis, hyperlipidemia, and depression. She has history of a large type III mixed hiatal hernia for which she underwent Nissen fundoplication per Dr. Hassell Done about 3 years ago . She had a recent EGD done per Dr. Lucia Gaskins for evaluation of a recurrent hiatal hernia and was noted to have a approximate 5 cm hiatal hernia with part of the wrap located above the hiatus. Gastric mucosa appeared normal. Biopsies were taken and CLOtest was positive. She was then treated for the H. pylori and this has finished the antibiotics. Upper GI was done in January of 2015 which showed a small to moderate sized hiatal hernia is stable in appearance. She comes in here today because she says that she has been having problems since yesterday with some bright red blood mixed in with her bowel movements. She says she's been having lower abdominal discomfort over the past couple of weeks and had been constipated. She finally started having bowel movements yesterday and had several formed stools and then noted bright red blood on the tissue and in the commode. Has no complaints of rectal pain or hemorrhoidal symptoms. He continues to have lower abdominal discomfort today which is no different. She says she saw small amount of bright red blood on the tissue and in the commode today and was worried. She is very worried about the recurrence of her hiatal hernia and what it means and whether she will have to have surgery and wanted to be reestablished with GI. She denies any regular aspirin or NSAIDs. She has been  taking ranitidine twice daily.  Review of Systems  Constitutional: Negative.   HENT: Negative.   Eyes: Negative.   Cardiovascular: Negative.   Gastrointestinal: Positive for abdominal pain, constipation, blood in stool and anal bleeding.  Endocrine: Negative.   Genitourinary: Negative.   Musculoskeletal: Negative.   Skin: Negative.   Allergic/Immunologic: Negative.   Neurological: Negative.   Hematological: Negative.   Psychiatric/Behavioral: Negative.    Outpatient Prescriptions Prior to Visit  Medication Sig Dispense Refill  . allopurinol (ZYLOPRIM) 100 MG tablet Take 1 tablet (100 mg total) by mouth daily.  90 tablet  3  . diclofenac (VOLTAREN) 75 MG EC tablet Take 1 tablet (75 mg total) by mouth 2 (two) times daily.  180 tablet  3  . levothyroxine (SYNTHROID, LEVOTHROID) 100 MCG tablet Take 1 tablet (100 mcg total) by mouth daily before breakfast.  90 tablet  3  . nitroGLYCERIN (NITROSTAT) 0.3 MG SL tablet Place 1 tablet (0.3 mg total) under the tongue every 5 (five) minutes as needed for chest pain.  25 tablet  3  . ranitidine (ZANTAC) 150 MG tablet Take 1 tablet (150 mg total) by mouth 2 (two) times daily.  180 tablet  1  . lansoprazole (PREVACID) 30 MG capsule Take 1 capsule (30 mg total) by mouth every morning.  90 capsule  3  . LORazepam (ATIVAN) 0.5 MG tablet Take 1 tablet (0.5 mg total) by mouth every 8 (eight) hours as needed for anxiety.  60 tablet  2  . metoCLOPramide (REGLAN) 5 MG  tablet Take 1 tablet (5 mg total) by mouth 4 (four) times daily.  120 tablet  1   No facility-administered medications prior to visit.     Allergies  Allergen Reactions  . Demerol [Meperidine]   . Meperidine Hcl Other (See Comments)    Unknown   . Sulfonamide Derivatives Other (See Comments)    Happened about 15 years ago   Patient Active Problem List   Diagnosis Date Noted  . S/P Nissen fundoplication April 6073 71/10/2692  . CAD (coronary artery disease)   . Hypothyroidism   .  Hyperglycemia   . Depression   . Anemia   . Diverticulitis of colon   . Arthritis   . Gout   . Right knee DJD   . Supraventricular tachycardia 04/07/2012  . CAROTID ARTERY DISEASE 07/31/2010  . ANGINA, HX OF 07/30/2010  . ANKLE PAIN, RIGHT 05/01/2010  . CONJUNCTIVITIS 02/08/2010  . BUNION, LEFT FOOT 08/23/2009  . DIVERTICULITIS OF COLON 08/21/2009  . UNSPEC SYMPTOM ASSOC W/FEMALE GENITAL ORGANS 12/05/2008  . ALLERGIC RHINITIS 08/15/2008  . CHEST WALL PAIN, ANTERIOR 08/15/2008  . HYPERGLYCEMIA 06/21/2008  . ACUTE BRONCHITIS 01/07/2008  . OSTEOARTHRITIS 11/25/2007  . HYPOTHYROIDISM 04/27/2007  . HYPERLIPIDEMIA 04/27/2007  . ANEMIA-NOS 04/27/2007  . DEPRESSION 04/27/2007  . CORONARY ARTERY DISEASE 04/27/2007  . GERD 04/27/2007  . LOW BACK PAIN, CHRONIC 04/27/2007  . FATIGUE 04/27/2007   History  Substance Use Topics  . Smoking status: Never Smoker   . Smokeless tobacco: Never Used  . Alcohol Use: No   family history includes Diabetes Mellitus II in an other family member; Heart disease in an other family member.      Objective:   Physical Exam well-developed elderly white female in no acute stress, pleasant blood pressure 104/66 pulse 88 height 5 foot 2 weight 161. HEENT; nontraumatic normocephalic EOMI PERRLA sclera anicteric, Supple; no JVD, Cardiovascular; regular rate and rhythm with S1-S2 no murmur or gallop, Pulmonary ;clear bilaterally, Abdomen; soft she has mild tenderness in the left upper quadrant and left mid quadrant there is no guarding or rebound no palpable mass or hepatosplenomegaly bowel sounds are present, Rectal; exam no external lesion or hemorrhoids noted on digital exam no lesion felt stool is brown no fresh blood noted, she is Hemoccult-positive, Extremities; no clubbing cyanosis or edema skin warm and dry, Psych; mood and affect appropriate         Assessment & Plan:  # 78  80 year old female with 2-3 week history of lower abdominal discomfort,  constipation and now intermittent rectal bleeding x2 days. Etiology of her symptoms is not clear she may have some mild diverticulitis and bleeding secondary to internal hemorrhoid, will rule out other inflammatory process. #2 status post Nissen fundoplication 8546 with a recurrence of hiatal hernia #3 H pylori- status post recent treatment #4 marked diverticulosis #5 coronary artery disease #6 hypothyroidism #7 hyperlipidemia #8 history of SVT  Plan; CBC with differential and be met today Continue Zantac 150 mg by mouth twice daily CT of the abdomen and pelvis with contrast. Further plans pending results of labs and CT

## 2013-09-22 ENCOUNTER — Ambulatory Visit (INDEPENDENT_AMBULATORY_CARE_PROVIDER_SITE_OTHER)
Admission: RE | Admit: 2013-09-22 | Discharge: 2013-09-22 | Disposition: A | Discharge status: Home / Self Care | Payer: Medicare Other | Source: Ambulatory Visit | Attending: Physician Assistant | Admitting: Physician Assistant

## 2013-09-22 DIAGNOSIS — K625 Hemorrhage of anus and rectum: Secondary | ICD-10-CM

## 2013-09-22 DIAGNOSIS — K449 Diaphragmatic hernia without obstruction or gangrene: Secondary | ICD-10-CM | POA: Diagnosis not present

## 2013-09-22 DIAGNOSIS — R1012 Left upper quadrant pain: Secondary | ICD-10-CM | POA: Diagnosis not present

## 2013-09-22 DIAGNOSIS — N281 Cyst of kidney, acquired: Secondary | ICD-10-CM | POA: Diagnosis not present

## 2013-09-22 MED ORDER — IOHEXOL 300 MG/ML  SOLN
100.0000 mL | Freq: Once | INTRAMUSCULAR | Status: AC | PRN
Start: 2013-09-22 — End: 2013-09-22
  Administered 2013-09-22: 100 mL via INTRAVENOUS

## 2013-09-23 ENCOUNTER — Ambulatory Visit: Payer: Medicare Other | Admitting: Gastroenterology

## 2013-09-26 DIAGNOSIS — H2510 Age-related nuclear cataract, unspecified eye: Secondary | ICD-10-CM | POA: Diagnosis not present

## 2013-09-29 ENCOUNTER — Encounter: Payer: Self-pay | Admitting: Physician Assistant

## 2013-09-29 ENCOUNTER — Ambulatory Visit (INDEPENDENT_AMBULATORY_CARE_PROVIDER_SITE_OTHER): Payer: Medicare Other | Admitting: Physician Assistant

## 2013-09-29 VITALS — BP 112/68 | HR 64 | Ht 62.0 in | Wt 160.0 lb

## 2013-09-29 DIAGNOSIS — K449 Diaphragmatic hernia without obstruction or gangrene: Secondary | ICD-10-CM | POA: Diagnosis not present

## 2013-09-29 DIAGNOSIS — K219 Gastro-esophageal reflux disease without esophagitis: Secondary | ICD-10-CM

## 2013-09-29 DIAGNOSIS — Z09 Encounter for follow-up examination after completed treatment for conditions other than malignant neoplasm: Secondary | ICD-10-CM | POA: Diagnosis not present

## 2013-09-29 DIAGNOSIS — K5730 Diverticulosis of large intestine without perforation or abscess without bleeding: Secondary | ICD-10-CM | POA: Diagnosis not present

## 2013-09-29 DIAGNOSIS — Z9889 Other specified postprocedural states: Secondary | ICD-10-CM

## 2013-09-29 NOTE — Progress Notes (Signed)
Reviewed. UGI in 05/2013 confirmed free reflux ,HH and tertiary contractions. I agree with stepping up her PPI. She may want to discuss it with Dr Daphine DeutscherMartin Ezzard Standing/Newman.

## 2013-09-29 NOTE — Patient Instructions (Signed)
Stay on the Prevacid OTC twice daily. Take Voltaren if necessary.  Follow up with Amy Esterwood PA-C or Dr. Juanda ChanceBrodie as needed.

## 2013-09-29 NOTE — Progress Notes (Signed)
Subjective:    Patient ID: Rachael [name], female    DOB: 05/21/1931, 80 y.o.   MRN: [id number]  HPI  Rachael [name] is a very nice 80 year old white female known remotely to Dr. Lina Sarora Brodie who was seen about a week ago when she had noticed some bright red blood with her bowel movements. She had been constipated and had somemild lower abdominal discomfort., She was examined- noted to have brown stool which was trace heme positive. Labs were done on 09/21/2013, WBC 9.4, hemoglobin 14.4, hematocrit 43.8. She also is scheduled for CT scan of the abdomen and pelvis says she was quite concerned. CT scan shows a stable fat-containing Morgagni hernia, stable hiatal hernia surgical clips at the GE junction, no evidence of bowel obstruction diffuse diverticulosis in the left colon no diverticulitis no abdominal or pelvic masses free fluid or adenopathy. Patient wanted to come back in today to discuss her CT scan.  She had also undergone recent evaluation with Dr. Daphine DeutscherMartin and Dr. Ezzard StandingNewman. She is status post Nissen fundoplication in 2012 for a large type III mixed hernia. Recent EGD she was found to have a 5 cm hiatal hernia with part of her wrap  located above the hiatus -otherwise negative exam.  She has been having some heartburn and reflux symptoms. She is currently taking Prevacid 15 mg twice daily over-the-counter as is the  cheapest for her, and states that this works pretty well. She has no current complaints of dysphagia or odynophagia. Patient has no current complaints of abdominal pain, bowels are normal and she has not noticed any further blood. She is very anxious about cance,and had multiple questions today which were answered.    Review of Systems  Constitutional: Negative.   HENT: Negative.   Eyes: Negative.   Respiratory: Negative.   Cardiovascular: Negative.   Endocrine: Negative.   Genitourinary: Negative.   Musculoskeletal: Positive for arthralgias.  Skin: Negative.     Allergic/Immunologic: Negative.   Neurological: Negative.   Hematological: Negative.   Psychiatric/Behavioral: Negative.    Outpatient Prescriptions Prior to Visit  Medication Sig Dispense Refill  . allopurinol (ZYLOPRIM) 100 MG tablet Take 1 tablet (100 mg total) by mouth daily.  90 tablet  3  . diclofenac (VOLTAREN) 75 MG EC tablet Take 1 tablet (75 mg total) by mouth 2 (two) times daily.  180 tablet  3  . HYDROcodone-acetaminophen (NORCO/VICODIN) 5-325 MG per tablet Take 1 tablet by mouth as needed for moderate pain.      Marland Kitchen. levothyroxine (SYNTHROID, LEVOTHROID) 100 MCG tablet Take 1 tablet (100 mcg total) by mouth daily before breakfast.  90 tablet  3  . nitroGLYCERIN (NITROSTAT) 0.3 MG SL tablet Place 1 tablet (0.3 mg total) under the tongue every 5 (five) minutes as needed for chest pain.  25 tablet  3  . ranitidine (ZANTAC) 150 MG tablet Take 1 tablet (150 mg total) by mouth 2 (two) times daily.  180 tablet  1   No facility-administered medications prior to visit.   Allergies  Allergen Reactions  . Demerol [Meperidine]   . Meperidine Hcl Other (See Comments)    Unknown   . Sulfonamide Derivatives Other (See Comments)    Happened about 15 years ago   Patient Active Problem List   Diagnosis Date Noted  . S/P Nissen fundoplication April 2012 10/01/2012  . CAD (coronary artery disease)   . Hypothyroidism   . Hyperglycemia   . Depression   . Anemia   . Diverticulitis of  colon   . Arthritis   . Gout   . Right knee DJD   . Supraventricular tachycardia 04/07/2012  . CAROTID ARTERY DISEASE 07/31/2010  . ANGINA, HX OF 07/30/2010  . ANKLE PAIN, RIGHT 05/01/2010  . CONJUNCTIVITIS 02/08/2010  . BUNION, LEFT FOOT 08/23/2009  . DIVERTICULITIS OF COLON 08/21/2009  . UNSPEC SYMPTOM ASSOC W/FEMALE GENITAL ORGANS 12/05/2008  . ALLERGIC RHINITIS 08/15/2008  . CHEST WALL PAIN, ANTERIOR 08/15/2008  . HYPERGLYCEMIA 06/21/2008  . ACUTE BRONCHITIS 01/07/2008  . OSTEOARTHRITIS  11/25/2007  . HYPOTHYROIDISM 04/27/2007  . HYPERLIPIDEMIA 04/27/2007  . ANEMIA-NOS 04/27/2007  . DEPRESSION 04/27/2007  . CORONARY ARTERY DISEASE 04/27/2007  . GERD 04/27/2007  . LOW BACK PAIN, CHRONIC 04/27/2007  . FATIGUE 04/27/2007   History  Substance Use Topics  . Smoking status: Never Smoker   . Smokeless tobacco: Never Used  . Alcohol Use: No       Objective:   Physical Exam well-developed elderly white female in no acute distress, pleasant blood pressure 112/68 pulse 64 height 5 foot 2 weight 160. HEENT; nontraumatic normocephalic EOMI PERRLA sclera anicteric, Extremities; no clubbing cyanosis or edema skin warm and dry, Psych; mood and affect normal and appropriate, not further examined today.      Assessment & Plan:  #761  80 year old female with history of a large type III mixed hiatal hernia, status post Nissen fundoplication 2012 with recurrent reflux symptoms and evidence for loosened wrap and recurrent hiatal hernia on EGD done per Dr. Ezzard StandingNewman recently #2 diverticulosis #3 recent self-limited mild hematochezia-likely secondary to internal hemorrhoids and setting of constipation resolved #4 coronary artery disease #5 carotid artery disease #6 history of SVT #7 gout  Plan; long discussion with patient today and questions answered She will continue Prevacid 15 mg OTC twice daily Antireflux regimen Followup with Dr. Lina Sarora Brodie as needed

## 2013-10-03 ENCOUNTER — Encounter: Payer: Self-pay | Admitting: Internal Medicine

## 2013-10-03 ENCOUNTER — Encounter: Payer: Self-pay | Admitting: Gastroenterology

## 2013-11-08 ENCOUNTER — Encounter: Payer: Self-pay | Admitting: Family Medicine

## 2013-11-08 ENCOUNTER — Ambulatory Visit (INDEPENDENT_AMBULATORY_CARE_PROVIDER_SITE_OTHER): Payer: Medicare Other | Admitting: Family Medicine

## 2013-11-08 VITALS — BP 106/64 | Temp 99.0°F | Ht 65.0 in | Wt 164.0 lb

## 2013-11-08 DIAGNOSIS — J209 Acute bronchitis, unspecified: Secondary | ICD-10-CM

## 2013-11-08 MED ORDER — TERBINAFINE HCL 250 MG PO TABS: 250.0000 mg | ORAL_TABLET | Freq: Every day | ORAL | Status: DC

## 2013-11-08 MED ORDER — DICLOFENAC SODIUM 75 MG PO TBEC: 75.0000 mg | DELAYED_RELEASE_TABLET | Freq: Two times a day (BID) | ORAL | Status: DC

## 2013-11-08 MED ORDER — AZITHROMYCIN 250 MG PO TABS: ORAL_TABLET | ORAL | Status: DC

## 2013-11-08 NOTE — Progress Notes (Signed)
Pre visit review using our clinic review tool, if applicable. No additional management support is needed unless otherwise documented below in the visit note. 

## 2013-11-08 NOTE — Progress Notes (Signed)
   Subjective:    Patient ID: Rachael [name], female    DOB: 06/05/1930, 80 y.o.   MRN: [id number]  HPI Here for 5 days of PND, ST, and coughing up green sputum. She had a fever at first but not today.    Review of Systems  Constitutional: Positive for fever and fatigue.  HENT: Positive for congestion and postnasal drip. Negative for sinus pressure.   Eyes: Negative.   Respiratory: Positive for cough.        Objective:   Physical Exam  Constitutional: She appears well-developed and well-nourished.  HENT:  Right Ear: External ear normal.  Left Ear: External ear normal.  Nose: Nose normal.  Mouth/Throat: Oropharynx is clear and moist.  Eyes: Conjunctivae are normal.  Pulmonary/Chest: Effort normal and breath sounds normal.  Lymphadenopathy:    She has no cervical adenopathy.          Assessment & Plan:  Add Mucinex.

## 2013-11-10 ENCOUNTER — Other Ambulatory Visit (INDEPENDENT_AMBULATORY_CARE_PROVIDER_SITE_OTHER): Payer: Self-pay

## 2013-11-10 ENCOUNTER — Telehealth (INDEPENDENT_AMBULATORY_CARE_PROVIDER_SITE_OTHER): Payer: Self-pay

## 2013-11-10 DIAGNOSIS — K219 Gastro-esophageal reflux disease without esophagitis: Secondary | ICD-10-CM

## 2013-11-10 DIAGNOSIS — K449 Diaphragmatic hernia without obstruction or gangrene: Principal | ICD-10-CM

## 2013-11-10 NOTE — Telephone Encounter (Signed)
Called and spoke to patient to make aware of UGI scheduled for [facility]15[facility]13 [facility]am [facility] [address], patient aware to be NPO after midnight the night before.  Patient scheduled for Breath Tek on 12/07/13 @ 7:15 am at [facility].  Patient currently on an antibiotic for bronchitis for 5 days.  Due to patient taking antibiotics for 5 days and taking acid reducers, we had to schedule her breath tek out two (2) weeks on 12/06/13.  Patient scheduled a follow up appointment with Dr. Daphine DeutscherMartin on 12/08/13 @ 11:10am to discuss results.  Patient requested appointments to be mailed to her home address.

## 2013-11-11 NOTE — Telephone Encounter (Signed)
Pt calling back to see if she can take her Vicodin and Voltaren. Informed pt that she can not take any antibiotics, acid reducers or NSAID's. Advised her that Voltaren is a NSAID so she should not take this 2 weeks prior to test. Pt verbalized understanding.

## 2013-11-15 ENCOUNTER — Ambulatory Visit
Admission: RE | Admit: 2013-11-15 | Discharge: 2013-11-15 | Disposition: A | Discharge status: Home / Self Care | Payer: Medicare Other | Source: Ambulatory Visit | Attending: Surgery | Admitting: Surgery

## 2013-11-15 DIAGNOSIS — K219 Gastro-esophageal reflux disease without esophagitis: Secondary | ICD-10-CM | POA: Diagnosis not present

## 2013-11-15 DIAGNOSIS — K449 Diaphragmatic hernia without obstruction or gangrene: Secondary | ICD-10-CM | POA: Diagnosis not present

## 2013-11-16 ENCOUNTER — Encounter (INDEPENDENT_AMBULATORY_CARE_PROVIDER_SITE_OTHER): Payer: Medicare Other | Admitting: Surgery

## 2013-11-22 ENCOUNTER — Encounter (INDEPENDENT_AMBULATORY_CARE_PROVIDER_SITE_OTHER): Payer: Medicare Other | Admitting: Surgery

## 2013-11-24 ENCOUNTER — Telehealth (INDEPENDENT_AMBULATORY_CARE_PROVIDER_SITE_OTHER): Payer: Self-pay

## 2013-11-24 NOTE — Telephone Encounter (Signed)
Message copied by Maryan PulsMOORE, Mikael Debell on Thu Nov 24, 2013 12:04 PM ------      Message from: Kerrin ChampagneLOWERY, TENA M      Created: Tue Nov 22, 2013  9:33 AM      Contact: [phone]       Pt called in concerning an appt for July 6 do you know what she is talking about. I told her appt is July 16 she said she is not talking about that. ------

## 2013-11-24 NOTE — Telephone Encounter (Deleted)
Message copied by Maryan PulsMOORE, Ellouise Mcwhirter on Thu Nov 24, 2013 12:06 PM ------      Message from: Kerrin ChampagneLOWERY, TENA M      Created: Tue Nov 22, 2013  9:33 AM      Contact: [phone]       Pt called in concerning an appt for July 6 do you know what she is talking about. I told her appt is July 16 she said she is not talking about that. ------

## 2013-11-24 NOTE — Telephone Encounter (Signed)
Called and spoke to patient regarding upcoming appointment for Breath Tek.  Patient states she's having a lot of acid reflux and wishes to move her appointment for 12/08/13 to an earlier date if possible.  [facility] and [facility] and patient has been scheduled for Monday 11/28/13 @ 8:30am.  Patient notified and aware of arrival time.

## 2013-11-28 ENCOUNTER — Encounter (HOSPITAL_COMMUNITY)
Admission: RE | Disposition: A | Discharge status: Home / Self Care | Payer: Self-pay | Source: Ambulatory Visit | Attending: Surgery

## 2013-11-28 ENCOUNTER — Ambulatory Visit (HOSPITAL_COMMUNITY)
Admission: RE | Admit: 2013-11-28 | Discharge: 2013-11-28 | Disposition: A | Discharge status: Home / Self Care | Payer: Medicare Other | Source: Ambulatory Visit | Attending: Surgery | Admitting: Surgery

## 2013-11-28 DIAGNOSIS — K219 Gastro-esophageal reflux disease without esophagitis: Secondary | ICD-10-CM | POA: Insufficient documentation

## 2013-11-28 HISTORY — PX: BREATH TEK H PYLORI: SHX5422

## 2013-11-28 SURGERY — BREATH TEST, FOR HELICOBACTER PYLORI

## 2013-11-28 NOTE — Progress Notes (Signed)
11/28/13 1308  BREATH TEK ASSESSMENT  Referring MD Luretha MurphyMatthew Martin  Time of Last PO Intake 1500 (on 11/27/2013)  Baseline Breath At: 0807  Pranactin Given At: 0809  Post-Dose Breath At: 0824  Sample 1 4.2  Sample 2 1.7  Test Negative

## 2013-11-29 ENCOUNTER — Encounter (HOSPITAL_COMMUNITY): Payer: Self-pay | Admitting: Surgery

## 2013-12-08 ENCOUNTER — Ambulatory Visit (INDEPENDENT_AMBULATORY_CARE_PROVIDER_SITE_OTHER): Payer: Medicare Other | Admitting: Surgery

## 2013-12-08 ENCOUNTER — Encounter (INDEPENDENT_AMBULATORY_CARE_PROVIDER_SITE_OTHER): Payer: Self-pay | Admitting: Surgery

## 2013-12-08 VITALS — BP 126/76 | HR 80 | Temp 98.0°F | Ht 63.0 in | Wt 162.0 lb

## 2013-12-08 DIAGNOSIS — Z9889 Other specified postprocedural states: Secondary | ICD-10-CM

## 2013-12-08 DIAGNOSIS — Z09 Encounter for follow-up examination after completed treatment for conditions other than malignant neoplasm: Secondary | ICD-10-CM

## 2013-12-08 NOTE — Patient Instructions (Signed)
Eat some Activia yogurt daily

## 2013-12-08 NOTE — Progress Notes (Signed)
Versie Starksorothea J Fournet 80 y.o.  Body mass index is 28.7 kg/(m^2).  Patient Active Problem List   Diagnosis Date Noted  . S/P Nissen fundoplication April 2012 10/01/2012  . CAD (coronary artery disease)   . Arthritis   . Gout   . Right knee DJD   . Supraventricular tachycardia 04/07/2012  . ANGINA, HX OF 07/30/2010  . ANKLE PAIN, RIGHT 05/01/2010  . BUNION, LEFT FOOT 08/23/2009  . DIVERTICULITIS OF COLON 08/21/2009  . ALLERGIC RHINITIS 08/15/2008  . CHEST WALL PAIN, ANTERIOR 08/15/2008  . HYPERGLYCEMIA 06/21/2008  . OSTEOARTHRITIS 11/25/2007  . HYPOTHYROIDISM 04/27/2007  . HYPERLIPIDEMIA 04/27/2007  . ANEMIA-NOS 04/27/2007  . DEPRESSION 04/27/2007  . CORONARY ARTERY DISEASE 04/27/2007  . GERD 04/27/2007  . LOW BACK PAIN, CHRONIC 04/27/2007  . FATIGUE 04/27/2007    Allergies  Allergen Reactions  . Demerol [Meperidine]   . Meperidine Hcl Other (See Comments)    Unknown   . Sulfonamide Derivatives Other (See Comments)    Happened about 15 years ago      Past Surgical History  Procedure Laterality Date  . Hiatal hernia repair  09-21-10    per Dr. Luretha MurphyMatthew Oakleigh Hesketh, lap [employer]   . Hernia repair    . Abdominal hysterectomy    . Total knee arthroplasty  06/07/2012    Procedure: TOTAL KNEE ARTHROPLASTY;  Surgeon: Nilda Simmerobert A Wainer, MD;  [facility];  Service: Orthopedics;  Laterality: Right;  . Partial colectomy      for adhesions  . Colonoscopy  10-10-05    per Dr. Juanda ChanceBrodie, repeat in 10 yrs   . Colon surgery    . Esophagogastroduodenoscopy N/A 08/29/2013    Procedure: ESOPHAGOGASTRODUODENOSCOPY (EGD);  Surgeon: Kandis Cockingavid H Newman, MD;  [facility];  Service: General;  Laterality: N/A;  . Breath tek h pylori N/A 11/28/2013    Procedure: BREATH TEK H PYLORI;  Surgeon: Valarie MerinoMatthew B Latanga Nedrow, MD;  [facility];  Service: General;  Laterality: N/A;   Nelwyn SalisburyFRY,STEPHEN A, MD No diagnosis found.  Breath tek was negative.   Matt B. Daphine DeutscherMartin, MD, [facility]  [facility] [phone] beeper [phone]  12/08/2013 12:23 PM

## 2013-12-20 ENCOUNTER — Encounter: Payer: Self-pay | Admitting: Family Medicine

## 2013-12-20 ENCOUNTER — Ambulatory Visit (INDEPENDENT_AMBULATORY_CARE_PROVIDER_SITE_OTHER): Payer: Medicare Other | Admitting: Family Medicine

## 2013-12-20 VITALS — BP 144/85 | HR 68 | Temp 98.8°F | Ht 63.0 in | Wt 162.0 lb

## 2013-12-20 DIAGNOSIS — R7309 Other abnormal glucose: Secondary | ICD-10-CM

## 2013-12-20 DIAGNOSIS — K219 Gastro-esophageal reflux disease without esophagitis: Secondary | ICD-10-CM

## 2013-12-20 DIAGNOSIS — M1990 Unspecified osteoarthritis, unspecified site: Secondary | ICD-10-CM | POA: Diagnosis not present

## 2013-12-20 DIAGNOSIS — E039 Hypothyroidism, unspecified: Secondary | ICD-10-CM

## 2013-12-20 DIAGNOSIS — E785 Hyperlipidemia, unspecified: Secondary | ICD-10-CM | POA: Diagnosis not present

## 2013-12-20 MED ORDER — ALLOPURINOL 100 MG PO TABS: 100.0000 mg | ORAL_TABLET | Freq: Every day | ORAL | Status: DC

## 2013-12-20 MED ORDER — LEVOTHYROXINE SODIUM 100 MCG PO TABS: 100.0000 ug | ORAL_TABLET | Freq: Every day | ORAL | Status: DC

## 2013-12-20 MED ORDER — HYDROCODONE-ACETAMINOPHEN 5-325 MG PO TABS: 1.0000 | ORAL_TABLET | Freq: Four times a day (QID) | ORAL | Status: DC | PRN

## 2013-12-20 MED ORDER — NYSTATIN 100000 UNIT/GM EX CREA: 1.0000 "application " | TOPICAL_CREAM | Freq: Two times a day (BID) | CUTANEOUS | Status: DC

## 2013-12-20 MED ORDER — ACYCLOVIR 200 MG PO CAPS: 200.0000 mg | ORAL_CAPSULE | Freq: Every day | ORAL | Status: DC

## 2013-12-20 MED ORDER — NEOMYCIN-POLYMYXIN-DEXAMETH 0.1 % OP SUSP: 1.0000 [drp] | Freq: Four times a day (QID) | OPHTHALMIC | Status: DC

## 2013-12-20 MED ORDER — METHYLPREDNISOLONE ACETATE 40 MG/ML IJ SUSP
120.0000 mg | Freq: Once | INTRAMUSCULAR | Status: AC
  Administered 2013-12-20: 120 mg via INTRAMUSCULAR

## 2013-12-20 MED ORDER — RANITIDINE HCL 150 MG PO TABS: 150.0000 mg | ORAL_TABLET | Freq: Every day | ORAL | Status: DC

## 2013-12-20 NOTE — Progress Notes (Signed)
   Subjective:    Patient ID: Rachael [name], female    DOB: 12/02/1930, 80 y.o.   MRN: [id number]  HPI Here for refills and to recheck some PND and a dry cough that has persisted for over a month. She does not feel sick per se, no fever. She has been taking OTC Nexium in the mornings but still gets some reflux in the evenings.    Review of Systems  Constitutional: Negative.   Respiratory: Positive for cough. Negative for chest tightness, shortness of breath and wheezing.   Cardiovascular: Negative.   Gastrointestinal: Negative.        Objective:   Physical Exam  Constitutional: She appears well-developed and well-nourished.  Cardiovascular: Normal rate, regular rhythm, normal heart sounds and intact distal pulses.   Pulmonary/Chest: Effort normal and breath sounds normal.          Assessment & Plan:  Refilled meds. Given a steroid shot for the allergies. Add Ranitidine in the evenings to control GERD.

## 2013-12-20 NOTE — Addendum Note (Signed)
Addended by: Aniceto BossNIMMONS, SYLVIA A on: 12/20/2013 12:23 PM   Modules accepted: Orders

## 2013-12-20 NOTE — Progress Notes (Signed)
Pre visit review using our clinic review tool, if applicable. No additional management support is needed unless otherwise documented below in the visit note. 

## 2014-01-25 DIAGNOSIS — M1710 Unilateral primary osteoarthritis, unspecified knee: Secondary | ICD-10-CM | POA: Diagnosis not present

## 2014-03-06 ENCOUNTER — Telehealth: Payer: Self-pay | Admitting: Physician Assistant

## 2014-03-07 NOTE — Telephone Encounter (Signed)
Patient calling to report increase in bloating. She is taking Nexium in AM and Zantac in PM. States she has had this before but it seems worse to her. Scheduled with Mike GipAmy Esterwood, PA on 03/14/14 at 2:00 pm.

## 2014-03-14 ENCOUNTER — Encounter: Payer: Self-pay | Admitting: Physician Assistant

## 2014-03-14 ENCOUNTER — Ambulatory Visit (INDEPENDENT_AMBULATORY_CARE_PROVIDER_SITE_OTHER): Payer: Medicare Other | Admitting: Physician Assistant

## 2014-03-14 ENCOUNTER — Other Ambulatory Visit (INDEPENDENT_AMBULATORY_CARE_PROVIDER_SITE_OTHER): Payer: Medicare Other

## 2014-03-14 VITALS — BP 100/60 | HR 92 | Ht 63.0 in | Wt 164.4 lb

## 2014-03-14 DIAGNOSIS — R1900 Intra-abdominal and pelvic swelling, mass and lump, unspecified site: Secondary | ICD-10-CM

## 2014-03-14 DIAGNOSIS — R1084 Generalized abdominal pain: Secondary | ICD-10-CM | POA: Diagnosis not present

## 2014-03-14 LAB — CBC WITH DIFFERENTIAL/PLATELET
BASOS PCT: 0.7 % (ref 0.0–3.0)
Basophils Absolute: 0.1 10*3/uL (ref 0.0–0.1)
Eosinophils Absolute: 0.3 10*3/uL (ref 0.0–0.7)
Eosinophils Relative: 2.7 % (ref 0.0–5.0)
HEMATOCRIT: 49.1 % — AB (ref 36.0–46.0)
Hemoglobin: 16 g/dL — ABNORMAL HIGH (ref 12.0–15.0)
LYMPHS PCT: 36.2 % (ref 12.0–46.0)
Lymphs Abs: 3.7 10*3/uL (ref 0.7–4.0)
MCHC: 32.7 g/dL (ref 30.0–36.0)
MCV: 89.9 fl (ref 78.0–100.0)
MONOS PCT: 8.1 % (ref 3.0–12.0)
Monocytes Absolute: 0.8 10*3/uL (ref 0.1–1.0)
NEUTROS ABS: 5.4 10*3/uL (ref 1.4–7.7)
Neutrophils Relative %: 52.3 % (ref 43.0–77.0)
PLATELETS: 292 10*3/uL (ref 150.0–400.0)
RBC: 5.46 Mil/uL — ABNORMAL HIGH (ref 3.87–5.11)
RDW: 15.1 % (ref 11.5–15.5)
WBC: 10.3 10*3/uL (ref 4.0–10.5)

## 2014-03-14 LAB — COMPREHENSIVE METABOLIC PANEL
ALK PHOS: 92 U/L (ref 39–117)
ALT: 25 U/L (ref 0–35)
AST: 32 U/L (ref 0–37)
Albumin: 3.9 g/dL (ref 3.5–5.2)
BILIRUBIN TOTAL: 0.9 mg/dL (ref 0.2–1.2)
BUN: 21 mg/dL (ref 6–23)
CO2: 17 mEq/L — ABNORMAL LOW (ref 19–32)
Calcium: 9.6 mg/dL (ref 8.4–10.5)
Chloride: 107 mEq/L (ref 96–112)
Creatinine, Ser: 1.2 mg/dL (ref 0.4–1.2)
GFR: 44.88 mL/min — ABNORMAL LOW (ref >60.00)
GLUCOSE: 101 mg/dL — AB (ref 70–99)
POTASSIUM: 4.4 meq/L (ref 3.5–5.1)
SODIUM: 145 meq/L (ref 135–145)
Total Protein: 8.3 g/dL (ref 6.0–8.3)

## 2014-03-14 NOTE — Patient Instructions (Signed)
Please go to the basement level to have your labs drawn and stool study. You have been scheduled for a CT scan of the abdomen and pelvis at [facility] ([address]---this is in the same building as Press [facility]).   You are scheduled on THursday 03-16-2014 at 2:30 PM . You should arrive at 2:15 PM  prior to your appointment time for registration. Please follow the written instructions below on the day of your exam:  WARNING: IF YOU ARE ALLERGIC TO IODINE/X-RAY DYE, PLEASE NOTIFY RADIOLOGY IMMEDIATELY AT [phone]! YOU WILL BE GIVEN A 13 HOUR PREMEDICATION PREP.  1) Do not eat or drink anything after  10:30 am  (4 hours prior to your test) 2) You have been given 2 bottles of oral contrast to drink. The solution may taste better if refrigerated, but do NOT add ice or any other liquid to this solution. Shake  well before drinking.    Drink 1 bottle of contrast @ 1:30 PM  (2 hours prior to your exam)  Drink 1 bottle of contrast @ 2:30 PM  (1 hour prior to your exam)  You may take any medications as prescribed with a small amount of water except for the following: Metformin, Glucophage, Glucovance, Avandamet, Riomet, Fortamet, Actoplus Met, Janumet, Glumetza or Metaglip. The above medications must be held the day of the exam AND 48 hours after the exam.  The purpose of you drinking the oral contrast is to aid in the visualization of your intestinal tract. The contrast solution may cause some diarrhea. Before your exam is started, you will be given a small amount of fluid to drink. Depending on your individual set of symptoms, you may also receive an intravenous injection of x-ray contrast/dye. Plan on being at [facility] for 30 minutes or long, depending on the type of exam you are having performed.  If you have any questions regarding your exam or if you need to reschedule, you may call the CT department at [phone] between the hours of 8:00 am and 5:00 pm,  Monday-Friday.  ________________________________________________________________________

## 2014-03-14 NOTE — Progress Notes (Signed)
Subjective:    Patient ID: Rachael [name], female    DOB: 11/18/1930, 80 y.o.   MRN: [id number]  HPI Rachael [name] is a pleasant 80 year old white female known to Dr. Lina Sarora Brodie. She has history of coronary artery disease osteoarthritis anxiety depression, chronic GERD and history of diverticular disease. She had undergone a Nissen fundoplication in 2012 per Dr. Daphine DeutscherMartin. She had subsequently developed slippage of the wrap and on EGD was found to have a 5 cm hiatal hernia with part of the wrap located above the hiatus. She had undergone upper GI   6/15 which had shown re-herniation with moderate hiatal hernia and moderate GERD. CT scan of the abdomen and pelvis done spring 2015 for complaint of left upper quadrant pain and rectal bleeding showed no evidence of bowel obstruction, she had diffuse diverticulosis, a stable fat-containing Morgagani hernia and a stable hiatal hernia. Last colonoscopy done 2007 showed moderately severe diverticulosis, no polyps. Patient comes in today with complaints of abdominal swelling. She's quite concerned that she has an underlying malignancy or liver problem and she has been reading on the Internet. She says her abdomen has been swollen over the past couple of months and does not seem to go down. She has not noted any changes in her bowel habits no improvement post bowel movement. No significant constipation. Her appetite has been fine though she says she's been trying to eat less and lose weight. No nausea or vomiting. He says she has ongoing mid abdominal discomfort and feels" swollen " all the time. She is not having any retention of fluid in her extremities. She also wants to be tested for H. pylori. She feels that her results of breath tek  done earlier this summer may have been messed up. .    Review of Systems  Constitutional: Negative.   HENT: Negative.   Eyes: Negative.   Respiratory: Negative.   Cardiovascular: Negative.   Gastrointestinal: Positive for  abdominal pain and abdominal distention.  Endocrine: Negative.   Genitourinary: Negative.   Musculoskeletal: Negative.   Skin: Negative.   Allergic/Immunologic: Negative.   Neurological: Negative.   Hematological: Negative.   Psychiatric/Behavioral: Negative.    Outpatient Prescriptions Prior to Visit  Medication Sig Dispense Refill  . allopurinol (ZYLOPRIM) 100 MG tablet Take 1 tablet (100 mg total) by mouth daily.  90 tablet  3  . diclofenac (VOLTAREN) 75 MG EC tablet Take 1 tablet (75 mg total) by mouth 2 (two) times daily.  180 tablet  3  . HYDROcodone-acetaminophen (NORCO/VICODIN) 5-325 MG per tablet Take 1 tablet by mouth every 6 (six) hours as needed for moderate pain.  60 tablet  0  . levothyroxine (SYNTHROID, LEVOTHROID) 100 MCG tablet Take 1 tablet (100 mcg total) by mouth daily before breakfast.  90 tablet  3  . neomycin-polymyxin-dexamethasone (MAXITROL) 0.1 % ophthalmic suspension Place 1 drop into both eyes 4 (four) times daily.  5 mL  5  . nitroGLYCERIN (NITROSTAT) 0.3 MG SL tablet Place 1 tablet (0.3 mg total) under the tongue every 5 (five) minutes as needed for chest pain.  25 tablet  3  . nystatin cream (MYCOSTATIN) Apply 1 application topically 2 (two) times daily.  30 g  11  . ranitidine (ZANTAC) 150 MG tablet Take 1 tablet (150 mg total) by mouth at bedtime.  90 tablet  3  . terbinafine (LAMISIL) 250 MG tablet Take 1 tablet (250 mg total) by mouth daily.  90 tablet  0  . acyclovir (ZOVIRAX)  200 MG capsule Take 1 capsule (200 mg total) by mouth 5 (five) times daily.  60 capsule  5   No facility-administered medications prior to visit.   Allergies  Allergen Reactions  . Demerol [Meperidine]   . Meperidine Hcl Other (See Comments)    Unknown   . Sulfonamide Derivatives Other (See Comments)    Happened about 15 years ago   Patient Active Problem List   Diagnosis Date Noted  . S/P Nissen fundoplication April 2012 10/01/2012  . CAD (coronary artery disease)   .  Arthritis   . Gout   . Right knee DJD   . Supraventricular tachycardia 04/07/2012  . ANGINA, HX OF 07/30/2010  . ANKLE PAIN, RIGHT 05/01/2010  . BUNION, LEFT FOOT 08/23/2009  . DIVERTICULITIS OF COLON 08/21/2009  . ALLERGIC RHINITIS 08/15/2008  . HYPERGLYCEMIA 06/21/2008  . OSTEOARTHRITIS 11/25/2007  . HYPOTHYROIDISM 04/27/2007  . HYPERLIPIDEMIA 04/27/2007  . ANEMIA-NOS 04/27/2007  . DEPRESSION 04/27/2007  . CORONARY ARTERY DISEASE 04/27/2007  . GERD 04/27/2007  . LOW BACK PAIN, CHRONIC 04/27/2007  . FATIGUE 04/27/2007   History  Substance Use Topics  . Smoking status: Never Smoker   . Smokeless tobacco: Never Used  . Alcohol Use: No   family history includes Diabetes Mellitus II in an other family member; Heart disease in an other family member.     Objective:   Physical Exam well-developed elderly white female in no acute distress, pleasant blood pressure 100/60 pulse 92 height 5 foot 3 weight 164. HEENT; nontraumatic normocephalic EOMI PERRLA sclera anicteric, Supple; no JVD, Cardiovascular; regular rate and rhythm with S1-S2 no murmur or gallop, Pulmonary ;clear bilaterally, Abdomen ;soft no visible distention no palpable fluid wave, abdomen is obese she is tender in the mid abdomen and epigastrium no palpable mass or hepatosplenomegaly she does have incisional scars, Rectal; exam not done, Extremities ;no clubbing cyanosis or edema skin warm and dry, Psych ;mood and affect appropriate        Assessment & Plan:   #721  80 year old female with complaints of persistent abdominal distention/swelling x2 months. Etiology is not clear. She has had recurrence of a moderately large hiatal hernia and also has a or gagging hernia will rule out migration of abdominal contents, rule out partial low-grade obstruction though doubt, rule out ascites #2 colon neoplasia surveillance negative colonoscopy 2007 with the exception of diverticulosis #3 coronary artery disease #4 chronic  GERD-status post Nissen fundoplication 2012 with slippage and recurrence of hiatal hernia #5 hypothyroidism  Plan; CBC with diff, and CMET,H. pylori stool antigen Continue Nexium 40 mg by mouth every morning and ranitidine 150 mg every afternoon Schedule for CT scan of the abdomen and pelvis with contrast. Further plans pending results of above, she may not need further workup but just reassurance.

## 2014-03-15 ENCOUNTER — Other Ambulatory Visit: Payer: Medicare Other

## 2014-03-15 DIAGNOSIS — R1084 Generalized abdominal pain: Secondary | ICD-10-CM | POA: Diagnosis not present

## 2014-03-15 DIAGNOSIS — R1900 Intra-abdominal and pelvic swelling, mass and lump, unspecified site: Secondary | ICD-10-CM | POA: Diagnosis not present

## 2014-03-15 NOTE — Progress Notes (Signed)
Reviewed and agree. Hernia of Morgagni.

## 2014-03-16 ENCOUNTER — Ambulatory Visit (INDEPENDENT_AMBULATORY_CARE_PROVIDER_SITE_OTHER)
Admission: RE | Admit: 2014-03-16 | Discharge: 2014-03-16 | Disposition: A | Discharge status: Home / Self Care | Payer: Medicare Other | Source: Ambulatory Visit | Attending: Physician Assistant | Admitting: Physician Assistant

## 2014-03-16 DIAGNOSIS — R1900 Intra-abdominal and pelvic swelling, mass and lump, unspecified site: Secondary | ICD-10-CM | POA: Diagnosis not present

## 2014-03-16 DIAGNOSIS — R1084 Generalized abdominal pain: Secondary | ICD-10-CM | POA: Diagnosis not present

## 2014-03-16 DIAGNOSIS — K449 Diaphragmatic hernia without obstruction or gangrene: Secondary | ICD-10-CM | POA: Diagnosis not present

## 2014-03-16 LAB — HELICOBACTER PYLORI  SPECIAL ANTIGEN: H. PYLORI ANTIGEN STOOL: NEGATIVE

## 2014-03-16 MED ORDER — IOHEXOL 300 MG/ML  SOLN
100.0000 mL | Freq: Once | INTRAMUSCULAR | Status: AC | PRN
  Administered 2014-03-16: 100 mL via INTRAVENOUS

## 2014-03-30 ENCOUNTER — Encounter: Payer: Self-pay | Admitting: Family Medicine

## 2014-03-30 ENCOUNTER — Ambulatory Visit (INDEPENDENT_AMBULATORY_CARE_PROVIDER_SITE_OTHER): Payer: Medicare Other | Admitting: Family Medicine

## 2014-03-30 VITALS — BP 139/80 | HR 73 | Temp 98.4°F | Ht 63.0 in | Wt 168.0 lb

## 2014-03-30 DIAGNOSIS — J209 Acute bronchitis, unspecified: Secondary | ICD-10-CM

## 2014-03-30 MED ORDER — CEPHALEXIN 500 MG PO CAPS: 500.0000 mg | ORAL_CAPSULE | Freq: Three times a day (TID) | ORAL | Status: AC

## 2014-03-30 MED ORDER — ALLOPURINOL 100 MG PO TABS: 100.0000 mg | ORAL_TABLET | Freq: Every day | ORAL | Status: DC

## 2014-03-30 MED ORDER — HYDROCODONE-ACETAMINOPHEN 5-325 MG PO TABS: 1.0000 | ORAL_TABLET | Freq: Four times a day (QID) | ORAL | Status: DC | PRN

## 2014-03-30 MED ORDER — BENZONATATE 100 MG PO CAPS: 100.0000 mg | ORAL_CAPSULE | Freq: Two times a day (BID) | ORAL | Status: DC | PRN

## 2014-03-30 NOTE — Progress Notes (Signed)
   Subjective:    Patient ID: Rachael [name], female    DOB: 06/03/1930, 80 y.o.   MRN: [id number]  HPI Here for refills and for a week of chest congestion and coughing up yellow sputum. No fever.    Review of Systems  Constitutional: Negative.   HENT: Positive for congestion. Negative for postnasal drip and sinus pressure.   Eyes: Negative.   Respiratory: Positive for cough and chest tightness. Negative for shortness of breath and wheezing.   Cardiovascular: Negative.        Objective:   Physical Exam  Constitutional: Rachael [name] appears well-developed and well-nourished.  HENT:  Right Ear: External ear normal.  Left Ear: External ear normal.  Nose: Nose normal.  Mouth/Throat: Oropharynx is clear and moist.  Eyes: Conjunctivae are normal.  Cardiovascular: Normal rate, regular rhythm, normal heart sounds and intact distal pulses.   Pulmonary/Chest: Effort normal. No respiratory distress. Rachael [name] has no wheezes. Rachael [name] has no rales.  Scattered rhonchi   Lymphadenopathy:    Rachael [name] has no cervical adenopathy.          Assessment & Plan:  Get a CXR. Treat with Keflex and benzonatate

## 2014-03-30 NOTE — Progress Notes (Signed)
Pre visit review using our clinic review tool, if applicable. No additional management support is needed unless otherwise documented below in the visit note. 

## 2014-03-31 ENCOUNTER — Ambulatory Visit (INDEPENDENT_AMBULATORY_CARE_PROVIDER_SITE_OTHER)
Admission: RE | Admit: 2014-03-31 | Discharge: 2014-03-31 | Disposition: A | Discharge status: Home / Self Care | Payer: Medicare Other | Source: Ambulatory Visit | Attending: Family Medicine | Admitting: Family Medicine

## 2014-03-31 DIAGNOSIS — K449 Diaphragmatic hernia without obstruction or gangrene: Secondary | ICD-10-CM | POA: Diagnosis not present

## 2014-03-31 DIAGNOSIS — R05 Cough: Secondary | ICD-10-CM | POA: Diagnosis not present

## 2014-03-31 DIAGNOSIS — J984 Other disorders of lung: Secondary | ICD-10-CM | POA: Diagnosis not present

## 2014-03-31 DIAGNOSIS — J209 Acute bronchitis, unspecified: Secondary | ICD-10-CM

## 2014-03-31 DIAGNOSIS — R0989 Other specified symptoms and signs involving the circulatory and respiratory systems: Secondary | ICD-10-CM | POA: Diagnosis not present

## 2014-04-18 ENCOUNTER — Emergency Department (INDEPENDENT_AMBULATORY_CARE_PROVIDER_SITE_OTHER)
Admission: EM | Admit: 2014-04-18 | Discharge: 2014-04-18 | Disposition: A | Discharge status: Home / Self Care | Payer: Medicare Other | Source: Home / Self Care | Attending: Family Medicine | Admitting: Family Medicine

## 2014-04-18 ENCOUNTER — Inpatient Hospital Stay (HOSPITAL_COMMUNITY)
Admission: EM | Admit: 2014-04-18 | Discharge: 2014-04-19 | DRG: 069 | Disposition: A | Discharge status: Home / Self Care | Payer: Medicare Other | Attending: Internal Medicine | Admitting: Internal Medicine

## 2014-04-18 ENCOUNTER — Telehealth: Payer: Self-pay | Admitting: Family Medicine

## 2014-04-18 ENCOUNTER — Encounter (HOSPITAL_COMMUNITY): Payer: Self-pay | Admitting: Emergency Medicine

## 2014-04-18 ENCOUNTER — Emergency Department (HOSPITAL_COMMUNITY): Payer: Medicare Other

## 2014-04-18 ENCOUNTER — Telehealth: Payer: Self-pay | Admitting: Cardiology

## 2014-04-18 DIAGNOSIS — E039 Hypothyroidism, unspecified: Secondary | ICD-10-CM | POA: Diagnosis present

## 2014-04-18 DIAGNOSIS — Z881 Allergy status to other antibiotic agents status: Secondary | ICD-10-CM | POA: Diagnosis not present

## 2014-04-18 DIAGNOSIS — K219 Gastro-esophageal reflux disease without esophagitis: Secondary | ICD-10-CM | POA: Diagnosis present

## 2014-04-18 DIAGNOSIS — R7301 Impaired fasting glucose: Secondary | ICD-10-CM | POA: Diagnosis present

## 2014-04-18 DIAGNOSIS — Z90710 Acquired absence of both cervix and uterus: Secondary | ICD-10-CM | POA: Diagnosis not present

## 2014-04-18 DIAGNOSIS — Z96651 Presence of right artificial knee joint: Secondary | ICD-10-CM | POA: Diagnosis present

## 2014-04-18 DIAGNOSIS — I639 Cerebral infarction, unspecified: Secondary | ICD-10-CM | POA: Diagnosis not present

## 2014-04-18 DIAGNOSIS — Z882 Allergy status to sulfonamides status: Secondary | ICD-10-CM | POA: Diagnosis not present

## 2014-04-18 DIAGNOSIS — I359 Nonrheumatic aortic valve disorder, unspecified: Secondary | ICD-10-CM | POA: Diagnosis not present

## 2014-04-18 DIAGNOSIS — I2510 Atherosclerotic heart disease of native coronary artery without angina pectoris: Secondary | ICD-10-CM | POA: Diagnosis not present

## 2014-04-18 DIAGNOSIS — R200 Anesthesia of skin: Secondary | ICD-10-CM

## 2014-04-18 DIAGNOSIS — I6350 Cerebral infarction due to unspecified occlusion or stenosis of unspecified cerebral artery: Secondary | ICD-10-CM | POA: Diagnosis not present

## 2014-04-18 DIAGNOSIS — M109 Gout, unspecified: Secondary | ICD-10-CM | POA: Diagnosis present

## 2014-04-18 DIAGNOSIS — R209 Unspecified disturbances of skin sensation: Secondary | ICD-10-CM

## 2014-04-18 DIAGNOSIS — F329 Major depressive disorder, single episode, unspecified: Secondary | ICD-10-CM | POA: Diagnosis present

## 2014-04-18 DIAGNOSIS — Z7982 Long term (current) use of aspirin: Secondary | ICD-10-CM

## 2014-04-18 DIAGNOSIS — E785 Hyperlipidemia, unspecified: Secondary | ICD-10-CM | POA: Diagnosis not present

## 2014-04-18 DIAGNOSIS — M1711 Unilateral primary osteoarthritis, right knee: Secondary | ICD-10-CM | POA: Diagnosis present

## 2014-04-18 DIAGNOSIS — IMO0001 Reserved for inherently not codable concepts without codable children: Secondary | ICD-10-CM

## 2014-04-18 DIAGNOSIS — F418 Other specified anxiety disorders: Secondary | ICD-10-CM | POA: Diagnosis present

## 2014-04-18 DIAGNOSIS — R202 Paresthesia of skin: Secondary | ICD-10-CM | POA: Diagnosis not present

## 2014-04-18 DIAGNOSIS — G459 Transient cerebral ischemic attack, unspecified: Secondary | ICD-10-CM | POA: Diagnosis present

## 2014-04-18 DIAGNOSIS — Z79899 Other long term (current) drug therapy: Secondary | ICD-10-CM

## 2014-04-18 DIAGNOSIS — F419 Anxiety disorder, unspecified: Secondary | ICD-10-CM | POA: Diagnosis present

## 2014-04-18 LAB — CBC WITH DIFFERENTIAL/PLATELET
BASOS ABS: 0.1 10*3/uL (ref 0.0–0.1)
Basophils Relative: 1 % (ref 0–1)
Eosinophils Absolute: 0.1 10*3/uL (ref 0.0–0.7)
Eosinophils Relative: 2 % (ref 0–5)
HCT: 45 % (ref 36.0–46.0)
Hemoglobin: 14.4 g/dL (ref 12.0–15.0)
Lymphocytes Relative: 22 % (ref 12–46)
Lymphs Abs: 1.7 10*3/uL (ref 0.7–4.0)
MCH: 29 pg (ref 26.0–34.0)
MCHC: 32 g/dL (ref 30.0–36.0)
MCV: 90.5 fL (ref 78.0–100.0)
Monocytes Absolute: 0.4 10*3/uL (ref 0.1–1.0)
Monocytes Relative: 6 % (ref 3–12)
NEUTROS ABS: 5.3 10*3/uL (ref 1.7–7.7)
NEUTROS PCT: 69 % (ref 43–77)
Platelets: 282 10*3/uL (ref 150–400)
RBC: 4.97 MIL/uL (ref 3.87–5.11)
RDW: 13.8 % (ref 11.5–15.5)
WBC: 7.6 10*3/uL (ref 4.0–10.5)

## 2014-04-18 LAB — CBC
HEMATOCRIT: 44.1 % (ref 36.0–46.0)
Hemoglobin: 14.4 g/dL (ref 12.0–15.0)
MCH: 29.8 pg (ref 26.0–34.0)
MCHC: 32.7 g/dL (ref 30.0–36.0)
MCV: 91.3 fL (ref 78.0–100.0)
Platelets: 274 10*3/uL (ref 150–400)
RBC: 4.83 MIL/uL (ref 3.87–5.11)
RDW: 13.7 % (ref 11.5–15.5)
WBC: 7.1 10*3/uL (ref 4.0–10.5)

## 2014-04-18 LAB — COMPREHENSIVE METABOLIC PANEL
ALK PHOS: 95 U/L (ref 39–117)
ALT: 16 U/L (ref 0–35)
ALT: 15 U/L (ref 0–35)
ANION GAP: 15 (ref 5–15)
ANION GAP: 14 (ref 5–15)
AST: 18 U/L (ref 0–37)
AST: 17 U/L (ref 0–37)
Albumin: 4.1 g/dL (ref 3.5–5.2)
Albumin: 3.9 g/dL (ref 3.5–5.2)
Alkaline Phosphatase: 90 U/L (ref 39–117)
BILIRUBIN TOTAL: 0.4 mg/dL (ref 0.3–1.2)
BUN: 15 mg/dL (ref 6–23)
BUN: 13 mg/dL (ref 6–23)
CALCIUM: 9.2 mg/dL (ref 8.4–10.5)
CHLORIDE: 105 meq/L (ref 96–112)
CO2: 23 mEq/L (ref 19–32)
CO2: 24 meq/L (ref 19–32)
CREATININE: 0.84 mg/dL (ref 0.50–1.10)
Calcium: 9.7 mg/dL (ref 8.4–10.5)
Chloride: 105 mEq/L (ref 96–112)
Creatinine, Ser: 0.86 mg/dL (ref 0.50–1.10)
GFR calc non Af Amer: 62 mL/min — ABNORMAL LOW (ref >90)
GFR, EST AFRICAN AMERICAN: 71 mL/min — AB (ref >90)
GFR, EST AFRICAN AMERICAN: 74 mL/min — AB (ref >90)
GFR, EST NON AFRICAN AMERICAN: 63 mL/min — AB (ref >90)
GLUCOSE: 148 mg/dL — AB (ref 70–99)
Glucose, Bld: 107 mg/dL — ABNORMAL HIGH (ref 70–99)
POTASSIUM: 3.7 meq/L (ref 3.7–5.3)
Potassium: 3.8 mEq/L (ref 3.7–5.3)
Sodium: 143 mEq/L (ref 137–147)
Sodium: 143 mEq/L (ref 137–147)
TOTAL PROTEIN: 7.4 g/dL (ref 6.0–8.3)
Total Bilirubin: 0.4 mg/dL (ref 0.3–1.2)
Total Protein: 7.2 g/dL (ref 6.0–8.3)

## 2014-04-18 LAB — DIFFERENTIAL
BASOS ABS: 0.1 10*3/uL (ref 0.0–0.1)
Basophils Relative: 1 % (ref 0–1)
EOS PCT: 3 % (ref 0–5)
Eosinophils Absolute: 0.2 10*3/uL (ref 0.0–0.7)
LYMPHS PCT: 28 % (ref 12–46)
Lymphs Abs: 2 10*3/uL (ref 0.7–4.0)
MONO ABS: 0.4 10*3/uL (ref 0.1–1.0)
MONOS PCT: 6 % (ref 3–12)
Neutro Abs: 4.5 10*3/uL (ref 1.7–7.7)
Neutrophils Relative %: 62 % (ref 43–77)

## 2014-04-18 LAB — POCT I-STAT, CHEM 8
BUN: 16 mg/dL (ref 6–23)
CREATININE: 0.9 mg/dL (ref 0.50–1.10)
Calcium, Ion: 1.18 mmol/L (ref 1.13–1.30)
Chloride: 108 mEq/L (ref 96–112)
Glucose, Bld: 105 mg/dL — ABNORMAL HIGH (ref 70–99)
HCT: 48 % — ABNORMAL HIGH (ref 36.0–46.0)
Hemoglobin: 16.3 g/dL — ABNORMAL HIGH (ref 12.0–15.0)
Potassium: 3.7 mEq/L (ref 3.7–5.3)
SODIUM: 143 meq/L (ref 137–147)
TCO2: 24 mmol/L (ref 0–100)

## 2014-04-18 LAB — I-STAT TROPONIN, ED: Troponin i, poc: 0 ng/mL (ref 0.00–0.08)

## 2014-04-18 LAB — URINALYSIS, ROUTINE W REFLEX MICROSCOPIC
BILIRUBIN URINE: NEGATIVE
GLUCOSE, UA: NEGATIVE mg/dL
HGB URINE DIPSTICK: NEGATIVE
Ketones, ur: NEGATIVE mg/dL
Nitrite: NEGATIVE
PH: 5.5 (ref 5.0–8.0)
Protein, ur: NEGATIVE mg/dL
SPECIFIC GRAVITY, URINE: 1.018 (ref 1.005–1.030)
Urobilinogen, UA: 0.2 mg/dL (ref 0.0–1.0)

## 2014-04-18 LAB — RAPID URINE DRUG SCREEN, HOSP PERFORMED
Amphetamines: NOT DETECTED
Barbiturates: NOT DETECTED
Benzodiazepines: NOT DETECTED
COCAINE: NOT DETECTED
OPIATES: NOT DETECTED
Tetrahydrocannabinol: NOT DETECTED

## 2014-04-18 LAB — ETHANOL

## 2014-04-18 LAB — PROTIME-INR
INR: 1.07 (ref 0.00–1.49)
Prothrombin Time: 14 seconds (ref 11.6–15.2)

## 2014-04-18 LAB — URINE MICROSCOPIC-ADD ON

## 2014-04-18 LAB — APTT: aPTT: 28 seconds (ref 24–37)

## 2014-04-18 MED ORDER — DICLOFENAC SODIUM 75 MG PO TBEC
75.0000 mg | DELAYED_RELEASE_TABLET | Freq: Two times a day (BID) | ORAL | Status: DC
  Administered 2014-04-18: 75 mg via ORAL
  Filled 2014-04-18 (×2): qty 1

## 2014-04-18 MED ORDER — LORAZEPAM 1 MG PO TABS
1.0000 mg | ORAL_TABLET | Freq: Once | ORAL | Status: AC
  Administered 2014-04-18: 1 mg via ORAL
  Filled 2014-04-18: qty 1

## 2014-04-18 MED ORDER — NITROGLYCERIN 0.4 MG SL SUBL: 0.4000 mg | SUBLINGUAL_TABLET | SUBLINGUAL | Status: DC | PRN

## 2014-04-18 MED ORDER — ALLOPURINOL 100 MG PO TABS
100.0000 mg | ORAL_TABLET | Freq: Every day | ORAL | Status: DC
  Administered 2014-04-19: 100 mg via ORAL
  Filled 2014-04-18: qty 1

## 2014-04-18 MED ORDER — HEPARIN SODIUM (PORCINE) 5000 UNIT/ML IJ SOLN
5000.0000 [IU] | Freq: Three times a day (TID) | INTRAMUSCULAR | Status: DC
  Administered 2014-04-19 (×2): 5000 [IU] via SUBCUTANEOUS
  Filled 2014-04-18 (×2): qty 1

## 2014-04-18 MED ORDER — ASPIRIN 81 MG PO CHEW
324.0000 mg | CHEWABLE_TABLET | Freq: Once | ORAL | Status: DC
  Filled 2014-04-18: qty 4

## 2014-04-18 MED ORDER — STROKE: EARLY STAGES OF RECOVERY BOOK
Freq: Once | Status: AC
  Filled 2014-04-18: qty 1

## 2014-04-18 MED ORDER — FAMOTIDINE 20 MG PO TABS
20.0000 mg | ORAL_TABLET | Freq: Every day | ORAL | Status: DC
  Administered 2014-04-19: 20 mg via ORAL
  Filled 2014-04-18: qty 1

## 2014-04-18 MED ORDER — NEOMYCIN-POLYMYXIN-DEXAMETH 3.5-10000-0.1 OP SUSP
1.0000 [drp] | Freq: Four times a day (QID) | OPHTHALMIC | Status: DC
  Administered 2014-04-18: 1 [drp] via OPHTHALMIC
  Filled 2014-04-18: qty 5

## 2014-04-18 MED ORDER — NYSTATIN 100000 UNIT/GM EX CREA
1.0000 "application " | TOPICAL_CREAM | Freq: Two times a day (BID) | CUTANEOUS | Status: DC
  Administered 2014-04-19: 1 via TOPICAL
  Filled 2014-04-18: qty 15

## 2014-04-18 MED ORDER — ASPIRIN 300 MG RE SUPP: 300.0000 mg | Freq: Every day | RECTAL | Status: DC

## 2014-04-18 MED ORDER — LEVOTHYROXINE SODIUM 100 MCG PO TABS
100.0000 ug | ORAL_TABLET | Freq: Every day | ORAL | Status: DC
  Administered 2014-04-19: 100 ug via ORAL
  Filled 2014-04-18: qty 1

## 2014-04-18 MED ORDER — ASPIRIN 325 MG PO TABS
325.0000 mg | ORAL_TABLET | Freq: Every day | ORAL | Status: DC
  Administered 2014-04-18 – 2014-04-19 (×2): 325 mg via ORAL
  Filled 2014-04-18 (×2): qty 1

## 2014-04-18 MED ORDER — SENNOSIDES-DOCUSATE SODIUM 8.6-50 MG PO TABS: 1.0000 | ORAL_TABLET | Freq: Every evening | ORAL | Status: DC | PRN

## 2014-04-18 MED ORDER — NITROGLYCERIN 0.3 MG SL SUBL
0.3000 mg | SUBLINGUAL_TABLET | SUBLINGUAL | Status: DC | PRN
  Filled 2014-04-18: qty 100

## 2014-04-18 MED ORDER — NEOMYCIN-POLYMYXIN-DEXAMETH 0.1 % OP SUSP: 1.0000 [drp] | Freq: Four times a day (QID) | OPHTHALMIC | Status: DC

## 2014-04-18 NOTE — ED Notes (Signed)
Pt. called and said she has been waiting in the ED since 1130.  She asked why Dr. Artis FlockKindl could not order the CT scan.  I explained why he could not do that.  She wants to go home and get it done as an outpatient.  I told her to call Dr. Clent RidgesFry and ask him if he would do that.  I told her there may be other tests that the ED doctor wants to do and recommended she stay and be seen. 04/18/2014

## 2014-04-18 NOTE — ED Provider Notes (Signed)
CSN: [id number]     Arrival date & time 04/18/14  1126 History   First MD Initiated Contact with Patient 04/18/14 1227     Chief Complaint  Patient presents with  . Numbness   (Consider location/radiation/quality/duration/timing/severity/associated sxs/prior Treatment) Patient is a 80 y.o. female presenting with neurologic complaint. The history is provided by the patient.  Neurologic Problem This is a new problem. The current episode started 3 to 5 hours ago (tingling in perioral face and end of tongue and fingertips., no weakness or speech diff., felt fine prior to bed.). The problem has not changed since onset.Pertinent negatives include no chest pain, no abdominal pain, no headaches and no shortness of breath.    Past Medical History  Diagnosis Date  . GERD (gastroesophageal reflux disease)   . Gout     has seen Dr. Alben DeedsJames Beekman  . CAD (coronary artery disease)     sees Dr. Antoine PocheHochrein, normal Myoview stress test 02-26-12   . Hypothyroidism   . Hyperglycemia   . Depression   . Anemia   . Diverticulitis of colon   . Arthritis   . Right knee DJD   . H/O hiatal hernia   . Supraventricular tachycardia 04/07/12    In Dr Sherene SiresWainer's office - wore heart monitor for 3 weeks   . Shortness of breath     with anxiety attacks  . Anxiety   . Lightheadedness   . Osteoarthritis    Past Surgical History  Procedure Laterality Date  . Hiatal hernia repair  09-21-10    per Dr. Luretha MurphyMatthew Martin, lap [employer]   . Hernia repair    . Abdominal hysterectomy    . Total knee arthroplasty  06/07/2012    Procedure: TOTAL KNEE ARTHROPLASTY;  Surgeon: Nilda Simmerobert A Wainer, MD;  [facility];  Service: Orthopedics;  Laterality: Right;  . Partial colectomy      for adhesions  . Colonoscopy  10-10-05    per Dr. Juanda ChanceBrodie, repeat in 10 yrs   . Colon surgery    . Esophagogastroduodenoscopy N/A 08/29/2013    Procedure: ESOPHAGOGASTRODUODENOSCOPY (EGD);  Surgeon: Kandis Cockingavid H Newman, MD;  [facility];  Service:  General;  Laterality: N/A;  . Breath tek h pylori N/A 11/28/2013    Procedure: BREATH TEK Richardo PriestH PYLORI;  Surgeon: Valarie MerinoMatthew B Martin, MD;  Location[facility];  Service: General;  Laterality: N/A;   Family History  Problem Relation Age of Onset  . Diabetes Mellitus II    . Heart disease     History  Substance Use Topics  . Smoking status: Never Smoker   . Smokeless tobacco: Never Used  . Alcohol Use: No   OB History    No data available     Review of Systems  Constitutional: Negative.   Respiratory: Negative.  Negative for shortness of breath.   Cardiovascular: Negative for chest pain.  Gastrointestinal: Negative.  Negative for abdominal pain.  Neurological: Positive for numbness. Negative for weakness, light-headedness and headaches.    Allergies  Demerol; Meperidine hcl; and Sulfonamide derivatives  Home Medications   Prior to Admission medications   Medication Sig Start Date End Date Taking? Authorizing Provider  allopurinol (ZYLOPRIM) 100 MG tablet Take 1 tablet (100 mg total) by mouth daily. 03/30/14  Yes Nelwyn SalisburyStephen A Fry, MD  benzonatate (TESSALON) 100 MG capsule Take 1 capsule (100 mg total) by mouth 2 (two) times daily as needed for cough. 03/30/14  Yes Nelwyn SalisburyStephen A Fry, MD  levothyroxine (SYNTHROID, LEVOTHROID) 100  MCG tablet Take 1 tablet (100 mcg total) by mouth daily before breakfast. 12/20/13  Yes Nelwyn SalisburyStephen A Fry, MD  ranitidine (ZANTAC) 150 MG tablet Take 1 tablet (150 mg total) by mouth at bedtime. 12/20/13  Yes Nelwyn SalisburyStephen A Fry, MD  diclofenac (VOLTAREN) 75 MG EC tablet Take 1 tablet (75 mg total) by mouth 2 (two) times daily. 11/08/13   Nelwyn SalisburyStephen A Fry, MD  HYDROcodone-acetaminophen (NORCO/VICODIN) 5-325 MG per tablet Take 1 tablet by mouth every 6 (six) hours as needed for moderate pain. 03/30/14   Nelwyn SalisburyStephen A Fry, MD  neomycin-polymyxin-dexamethasone (MAXITROL) 0.1 % ophthalmic suspension Place 1 drop into both eyes 4 (four) times daily. 12/20/13   Nelwyn SalisburyStephen A Fry, MD  nitroGLYCERIN  (NITROSTAT) 0.3 MG SL tablet Place 1 tablet (0.3 mg total) under the tongue every 5 (five) minutes as needed for chest pain. 01/11/13   Nelwyn SalisburyStephen A Fry, MD  nystatin cream (MYCOSTATIN) Apply 1 application topically 2 (two) times daily. 12/20/13   Nelwyn SalisburyStephen A Fry, MD  terbinafine (LAMISIL) 250 MG tablet Take 1 tablet (250 mg total) by mouth daily. 11/08/13   Nelwyn SalisburyStephen A Fry, MD   BP 149/86 mmHg  Pulse 89  Temp(Src) 98.1 F (36.7 C) (Oral)  Resp 16  SpO2 94% Physical Exam  Constitutional: She is oriented to person, place, and time. She appears well-developed and well-nourished. No distress.  HENT:  Mouth/Throat: Oropharynx is clear and moist.  Eyes: Conjunctivae and EOM are normal. Pupils are equal, round, and reactive to light.  Neck: Normal range of motion. Neck supple.  Cardiovascular: Normal heart sounds.   Pulmonary/Chest: Breath sounds normal.  Neurological: She is alert and oriented to person, place, and time. She has normal reflexes. No cranial nerve deficit. She exhibits normal muscle tone. Coordination normal.  Skin: Skin is warm and dry.  Psychiatric: She has a normal mood and affect. Her behavior is normal.  Nursing note and vitals reviewed.   ED Course  Procedures (including critical care time) Labs Review Labs Reviewed  POCT I-STAT, CHEM 8 - Abnormal; Notable for the following:    Glucose, Bld 105 (*)    Hemoglobin 16.3 (*)    HCT 48.0 (*)    All other components within normal limits    Imaging Review No results found.   MDM   1. Paresthesias/numbness    Discussed with dr Karel Jarvisaquino - neurology, rec ct brain , will see prn.    Linna HoffJames D Cleveland Yarbro, MD 04/18/14 [phone]

## 2014-04-18 NOTE — Telephone Encounter (Signed)
Pt. States when she woke up this morning her tongue and lips were numb, she states she ate pizza last night which she never eats and thinks that is what it might be, I encouraged her to call her PCP for direction, pt. Denied throat swelling or sob, pt. Agreed with plan

## 2014-04-18 NOTE — Telephone Encounter (Signed)
FYI and Dr. Clent RidgesFry is aware that pt will be here at office in the morning.

## 2014-04-18 NOTE — ED Notes (Signed)
Pt reports lip and tongue and fingertip numbness starting when she woke up. LSN: last night at bedtime 2300. Pt reports it feels like she went to dentist. [facility]. Pt reports intermittent chest pain since April but denies it currently. Denies SOB.

## 2014-04-18 NOTE — ED Notes (Signed)
Hospitalist at bedside 

## 2014-04-18 NOTE — Telephone Encounter (Signed)
New problem    Pt woke up and she has numbness in tongue and lips. Pt would like a call back from nurse.

## 2014-04-18 NOTE — ED Notes (Signed)
MD at bedside. 

## 2014-04-18 NOTE — H&P (Signed)
[facility] History and Physical  Rachael StarksDorothea J Gruner ZOX:[id number] DOB: 04/18/1931 DOA: 04/18/2014  Referring physician: Doug SouSam Jacubowitz, MD PCP: Nelwyn SalisburyFRY,STEPHEN A, MD   Chief Complaint: Numbness around mouth  HPI: Rachael [name] is a 80 y.o. female presents with numbness around the mouth. Patient states taht she woke up this morning with numbness around her mouth. Patient states that she also noted some tingling on her tongue. She then noted some tingling in her fingers. The symptoms did not resolve so she decided to come into the ED. Patient was evalauted here with a MRI and this shows a possible acute infarct of the caudate. After discussion with neurology it was decided to admit her for workup. Patient has no headache. She states that she has no syncope. She does get on and off CP but has none now. Patient states that the numbness in the fingers is not a new finding and has had it before. She has no dizziness noted. She has no fevers or chills.   Review of Systems:  Constitutional:  No weight loss, night sweats, Fevers, chills, fatigue.  HEENT:  No headaches no vertigo mouth numbness Cardio-vascular:  No chest pain, Orthopnea, PND, swelling in lower extremities  GI:  No heartburn, indigestion, abdominal pain, nausea, vomiting, diarrhea  Resp:  No shortness of breath with exertion or at rest.no productive cough, No coughing up of blood  Skin:  no rash or lesions GU:  no dysuria, change in color of urine, no urgency or frequency.  Musculoskeletal:  No joint pain or swelling. No decreased range of motion.  Psych:  No change in mood or affect. No depression or anxiety.  Past Medical History  Diagnosis Date  . GERD (gastroesophageal reflux disease)   . Gout     has seen Dr. Alben DeedsJames Beekman  . CAD (coronary artery disease)     sees Dr. Antoine PocheHochrein, normal Myoview stress test 02-26-12   . Hypothyroidism   . Hyperglycemia   . Depression   . Anemia   . Diverticulitis of colon     . Arthritis   . Right knee DJD   . H/O hiatal hernia   . Supraventricular tachycardia 04/07/12    In Dr Sherene SiresWainer's office - wore heart monitor for 3 weeks   . Shortness of breath     with anxiety attacks  . Anxiety   . Lightheadedness   . Osteoarthritis    Past Surgical History  Procedure Laterality Date  . Hiatal hernia repair  09-21-10    per Dr. Luretha MurphyMatthew Martin, lap [employer]   . Hernia repair    . Abdominal hysterectomy    . Total knee arthroplasty  06/07/2012    Procedure: TOTAL KNEE ARTHROPLASTY;  Surgeon: Nilda Simmerobert A Wainer, MD;  [facility];  Service: Orthopedics;  Laterality: Right;  . Partial colectomy      for adhesions  . Colonoscopy  10-10-05    per Dr. Juanda ChanceBrodie, repeat in 10 yrs   . Colon surgery    . Esophagogastroduodenoscopy N/A 08/29/2013    Procedure: ESOPHAGOGASTRODUODENOSCOPY (EGD);  Surgeon: Kandis Cockingavid H Newman, MD;  [facility];  Service: General;  Laterality: N/A;  . Breath tek h pylori N/A 11/28/2013    Procedure: BREATH TEK H PYLORI;  Surgeon: Valarie MerinoMatthew B Martin, MD;  Location: [facility];  Service: General;  Laterality: N/A;   Social History:  reports that she has never smoked. She has never used smokeless tobacco. She reports that she does not drink alcohol or use  illicit drugs.  Allergies  Allergen Reactions  . Demerol [Meperidine]   . Meperidine Hcl Other (See Comments)    Unknown   . Sulfonamide Derivatives Other (See Comments)    Happened about 15 years ago    Family History  Problem Relation Age of Onset  . Diabetes Mellitus II    . Heart disease       Prior to Admission medications   Medication Sig Start Date End Date Taking? Authorizing Provider  allopurinol (ZYLOPRIM) 100 MG tablet Take 1 tablet (100 mg total) by mouth daily. 03/30/14  Yes Nelwyn Salisbury, MD  diclofenac (VOLTAREN) 75 MG EC tablet Take 1 tablet (75 mg total) by mouth 2 (two) times daily. 11/08/13  Yes Nelwyn Salisbury, MD  levothyroxine (SYNTHROID, LEVOTHROID) 100 MCG tablet  Take 1 tablet (100 mcg total) by mouth daily before breakfast. 12/20/13  Yes Nelwyn Salisbury, MD  neomycin-polymyxin-dexamethasone (MAXITROL) 0.1 % ophthalmic suspension Place 1 drop into both eyes 4 (four) times daily. 12/20/13  Yes Nelwyn Salisbury, MD  nitroGLYCERIN (NITROSTAT) 0.3 MG SL tablet Place 1 tablet (0.3 mg total) under the tongue every 5 (five) minutes as needed for chest pain. 01/11/13  Yes Nelwyn Salisbury, MD  nystatin cream (MYCOSTATIN) Apply 1 application topically 2 (two) times daily. 12/20/13  Yes Nelwyn Salisbury, MD  ranitidine (ZANTAC) 150 MG tablet Take 1 tablet (150 mg total) by mouth at bedtime. 12/20/13  Yes Nelwyn Salisbury, MD  benzonatate (TESSALON) 100 MG capsule Take 1 capsule (100 mg total) by mouth 2 (two) times daily as needed for cough. Patient not taking: Reported on 04/18/2014 03/30/14   Nelwyn Salisbury, MD  HYDROcodone-acetaminophen (NORCO/VICODIN) 5-325 MG per tablet Take 1 tablet by mouth every 6 (six) hours as needed for moderate pain. Patient not taking: Reported on 04/18/2014 03/30/14   Nelwyn Salisbury, MD  terbinafine (LAMISIL) 250 MG tablet Take 1 tablet (250 mg total) by mouth daily. Patient not taking: Reported on 04/18/2014 11/08/13   Nelwyn Salisbury, MD   Physical Exam: Filed Vitals:   04/18/14 1800 04/18/14 1815 04/18/14 1959 04/18/14 2043  BP: 121/62 119/64 134/67 121/84  Pulse: 63 71 76 84  Temp:      TempSrc:      Resp: 18 17 24 21   Height:      Weight:      SpO2: 95% 94% 96% 96%    Wt Readings from Last 3 Encounters:  04/18/14 75.751 kg (167 lb)  03/30/14 76.204 kg (168 lb)  03/14/14 74.571 kg (164 lb 6.4 oz)    General:  Appears calm and comfortable Eyes: PERRL, normal lids, irises & conjunctiva ENT: grossly normal hearing, lips & tongue Neck: no LAD, masses or thyromegaly Cardiovascular: RRR, no m/r/g. No LE edema. Respiratory: CTA bilaterally, no w/r/r. Normal respiratory effort. Abdomen: soft, ntnd Skin: no rash or induration seen on limited  exam Musculoskeletal: grossly normal tone BUE/BLE Psychiatric: grossly normal mood and affect, speech fluent and appropriate Neurologic: grossly non-focal.          Labs on Admission:  Basic Metabolic Panel:  Recent Labs Lab 04/18/14 1250 04/18/14 1514  NA 143 143  K 3.7 3.7  CL 108 105  CO2  --  23  GLUCOSE 105* 148*  BUN 16 15  CREATININE 0.90 0.86  CALCIUM  --  9.7   Liver Function Tests:  Recent Labs Lab 04/18/14 1514  AST 18  ALT 16  ALKPHOS 95  BILITOT 0.4  PROT 7.4  ALBUMIN 4.1   No results for input(s): LIPASE, AMYLASE in the last 168 hours. No results for input(s): AMMONIA in the last 168 hours. CBC:  Recent Labs Lab 04/18/14 1250 04/18/14 1514 04/18/14 2023  WBC  --  7.6 7.1  NEUTROABS  --  5.3 4.5  HGB 16.3* 14.4 14.4  HCT 48.0* 45.0 44.1  MCV  --  90.5 91.3  PLT  --  282 274   Cardiac Enzymes: No results for input(s): CKTOTAL, CKMB, CKMBINDEX, TROPONINI in the last 168 hours.  BNP (last 3 results) No results for input(s): PROBNP in the last 8760 hours. CBG: No results for input(s): GLUCAP in the last 168 hours.  Radiological Exams on Admission: Mr Sherrin DaisyBrain Wo Contrast  04/18/2014   CLINICAL DATA:  80 year old female with tingling perioral region, tongue and finger tips. Initial encounter.  EXAM: MRI HEAD WITHOUT CONTRAST  TECHNIQUE: Multiplanar, multiecho pulse sequences of the brain and surrounding structures were obtained without intravenous contrast.  COMPARISON:  12/10/2012.  FINDINGS: Questionable tiny acute infarct posterior left caudate head.  No intracranial hemorrhage.  Mild small vessel disease type changes.  Global atrophy without hydrocephalus.  No intracranial mass lesion noted on this unenhanced exam.  Partially empty non expanded sella. Infundibulum deviates slightly to the right. No discrete pituitary mass identified.  Mild cervical spondylotic changes upper cervical spine which transverse ligament hypertrophy. Cervical medullary  junction unremarkable.  Major intracranial vascular structures are patent. Atherosclerotic type changes vertebral artery bilaterally. Ectatic basilar artery.  IMPRESSION: Questionable tiny acute infarct posterior left caudate head.  No intracranial hemorrhage.  Mild small vessel disease type changes.  Global atrophy without hydrocephalus.  No intracranial mass lesion noted on this unenhanced exam.  Mild cervical spondylotic changes upper cervical spine which transverse ligament hypertrophy.  Major intracranial vascular structures are patent. Atherosclerotic type changes vertebral artery bilaterally. Ectatic basilar artery.   Electronically Signed   By: Bridgett LarssonSteve  Olson M.D.   On: 04/18/2014 19:25     Assessment/Plan Active Problems:   Hypothyroidism   Coronary atherosclerosis   Cerebrovascular accident (stroke)   Stroke   1. CVA -by CT scan the area of involvement does not seem to correlate with her symptoms -symptoms have nearly resolved -will check carotid doppler -Check MRI -check Echo -TSH and A1C  2. CAD -she is currently pain free -will monitor on telemetry  3. Hypothyroidism -continue with home medications -check TSH  4. Elevated Glucose -will check finger sticks -check A1C   Code Status: Full Code (must indicate code status--if unknown or must be presumed, indicate so) DVT Prophylaxis:Heparin Family Communication: None (indicate person spoken with, if applicable, with phone number if by telephone) Disposition Plan: HOme (indicate anticipated LOS)  Time spent: 60min  [facility]ari[facility] Memorial HospitalKHAN,Muhammadali Ries A [facility] Pager [phone]

## 2014-04-18 NOTE — Telephone Encounter (Signed)
Patient Information:  Caller Name: Apolinar JunesDorothea  Phone: [phone]  Patient: Rachael [name], [place]  Gender: Female  DOB: 01/21/1931  Age: 80 Years  PCP: Gershon CraneFry, [facility](Family Practice)  Office Follow Up:  Does the office need to follow up with this patient?: No  Instructions For The Office: N/A  RN Note:  Pt reports that she woke this am with Lip and Face tingling.  "feels like when you are at the dentist"  Pt denies any swelling.  No other areas of numbness, weakness or tingling.  She denies any new meds, new foods (except she ate pizza last night from [employer])   She can walk normal, talk normal.  No HA or Trauma.  'Got to office now disposition for Tingling of the face arm or leg on one side of the body'generated a yes.  Office contacted and spoke with Pink HillAmanda.  She talked to Dr Abran CantorFrye.  Pt recommended to go to UC today and then OV on 11/25.  Pt agrees to this and will follow instructions.  She does have a driver.  She will call back if needed.  Symptoms  Reason For Call & Symptoms: lips and face numbness  Reviewed Health History In EMR: Yes  Reviewed Medications In EMR: Yes  Reviewed Allergies In EMR: Yes  Reviewed Surgeries / Procedures: Yes  Date of Onset of Symptoms: 04/18/2014  Guideline(s) Used:  Neurologic Deficit  Disposition Per Guideline:   Go to Office Now  Reason For Disposition Reached:   Neurologic deficit of gradual onset, ANY of the following:   Weakness of the face, arm, or leg on one side of the body  Numbness of the face, arm, or leg on one side of the body  Loss of speech or garbled speech  Advice Given:  N/A  RN Overrode Recommendation:  Go To [facility].C.  [facility] Dr [facility]rye[facility]

## 2014-04-18 NOTE — Telephone Encounter (Signed)
Pt called [facility] for lip and tongue numbness and was to be directed to urgent care. Pt refuses to go, states she only wants to see Dr Clent RidgesFry. Feels she can wait until tomorrow. I scheduled a 9:30 AM appt for her and directed her to go to urgen care if sx worsen at all.

## 2014-04-18 NOTE — ED Provider Notes (Addendum)
CSN: [id number]     Arrival date & time 04/18/14  1440 History   First MD Initiated Contact with Patient 04/18/14 1640     Chief Complaint  Patient presents with  . Numbness     (Consider location/radiation/quality/duration/timing/severity/associated sxs/prior Treatment) HPI patient complains of numbness around her mouth and in her thumb index finger and Little finger of left hand onset upon awakening this morning. She denies chest pain denies shortness of breath denies difficulty speaking denies difficulty with coordination seen at urgent care center this morning and sent here for further evaluation. No treatment prior to coming here numbness is constant no other associated symptoms no treatment prior to coming here nothing makes symptoms better or worse.  Past Medical History  Diagnosis Date  . GERD (gastroesophageal reflux disease)   . Gout     has seen Dr. Alben DeedsJames Beekman  . CAD (coronary artery disease)     sees Dr. Antoine PocheHochrein, normal Myoview stress test 02-26-12   . Hypothyroidism   . Hyperglycemia   . Depression   . Anemia   . Diverticulitis of colon   . Arthritis   . Right knee DJD   . H/O hiatal hernia   . Supraventricular tachycardia 04/07/12    In Dr Sherene SiresWainer's office - wore heart monitor for 3 weeks   . Shortness of breath     with anxiety attacks  . Anxiety   . Lightheadedness   . Osteoarthritis    Past Surgical History  Procedure Laterality Date  . Hiatal hernia repair  09-21-10    per Dr. Luretha MurphyMatthew Martin, lap [employer]   . Hernia repair    . Abdominal hysterectomy    . Total knee arthroplasty  06/07/2012    Procedure: TOTAL KNEE ARTHROPLASTY;  Surgeon: Nilda Simmerobert A Wainer, MD;  [facility];  Service: Orthopedics;  Laterality: Right;  . Partial colectomy      for adhesions  . Colonoscopy  10-10-05    per Dr. Juanda ChanceBrodie, repeat in 10 yrs   . Colon surgery    . Esophagogastroduodenoscopy N/A 08/29/2013    Procedure: ESOPHAGOGASTRODUODENOSCOPY (EGD);  Surgeon: Kandis Cockingavid H Newman,  MD;  [facility];  Service: General;  Laterality: N/A;  . Breath tek h pylori N/A 11/28/2013    Procedure: BREATH TEK Richardo PriestH PYLORI;  Surgeon: Valarie MerinoMatthew B Martin, MD;  Location: [facility];  Service: General;  Laterality: N/A;   Family History  Problem Relation Age of Onset  . Diabetes Mellitus II    . Heart disease     History  Substance Use Topics  . Smoking status: Never Smoker   . Smokeless tobacco: Never Used  . Alcohol Use: No   OB History    No data available     Review of Systems  Constitutional: Negative.   HENT: Negative.   Respiratory: Negative.   Cardiovascular: Positive for chest pain.       Chest pain yesterday, worse with cough. Patient reports she gets chest pain approximately twice per week for several years. No chest pain today no cough  Gastrointestinal: Negative.   Musculoskeletal: Negative.   Skin: Negative.   Neurological: Positive for numbness.  Psychiatric/Behavioral: Negative.   All other systems reviewed and are negative.     Allergies  Demerol; Meperidine hcl; and Sulfonamide derivatives  Home Medications   Prior to Admission medications   Medication Sig Start Date End Date Taking? Authorizing Provider  allopurinol (ZYLOPRIM) 100 MG tablet Take 1 tablet (100 mg total) by mouth  daily. 03/30/14   Nelwyn Salisbury, MD  benzonatate (TESSALON) 100 MG capsule Take 1 capsule (100 mg total) by mouth 2 (two) times daily as needed for cough. 03/30/14   Nelwyn Salisbury, MD  diclofenac (VOLTAREN) 75 MG EC tablet Take 1 tablet (75 mg total) by mouth 2 (two) times daily. 11/08/13   Nelwyn Salisbury, MD  HYDROcodone-acetaminophen (NORCO/VICODIN) 5-325 MG per tablet Take 1 tablet by mouth every 6 (six) hours as needed for moderate pain. 03/30/14   Nelwyn Salisbury, MD  levothyroxine (SYNTHROID, LEVOTHROID) 100 MCG tablet Take 1 tablet (100 mcg total) by mouth daily before breakfast. 12/20/13   Nelwyn Salisbury, MD  neomycin-polymyxin-dexamethasone (MAXITROL) 0.1 %  ophthalmic suspension Place 1 drop into both eyes 4 (four) times daily. 12/20/13   Nelwyn Salisbury, MD  nitroGLYCERIN (NITROSTAT) 0.3 MG SL tablet Place 1 tablet (0.3 mg total) under the tongue every 5 (five) minutes as needed for chest pain. 01/11/13   Nelwyn Salisbury, MD  nystatin cream (MYCOSTATIN) Apply 1 application topically 2 (two) times daily. 12/20/13   Nelwyn Salisbury, MD  ranitidine (ZANTAC) 150 MG tablet Take 1 tablet (150 mg total) by mouth at bedtime. 12/20/13   Nelwyn Salisbury, MD  terbinafine (LAMISIL) 250 MG tablet Take 1 tablet (250 mg total) by mouth daily. 11/08/13   Nelwyn Salisbury, MD   BP 143/77 mmHg  Pulse 82  Temp(Src) 97.8 F (36.6 C) (Oral)  Resp 20  Ht 5\' 4"  (1.626 m)  Wt 167 lb (75.751 kg)  BMI 28.65 kg/m2  SpO2 96% Physical Exam  Constitutional: She is oriented to person, place, and time. She appears well-developed and well-nourished.  HENT:  Head: Normocephalic and atraumatic.  Right Ear: External ear normal.  Left Ear: External ear normal.  No facial asymmetry  Eyes: Conjunctivae are normal. Pupils are equal, round, and reactive to light.  Neck: Neck supple. No tracheal deviation present. No thyromegaly present.  No bruit  Cardiovascular: Normal rate and regular rhythm.   No murmur heard. Pulmonary/Chest: Effort normal and breath sounds normal.  Abdominal: Soft. Bowel sounds are normal. She exhibits no distension. There is no tenderness.  Musculoskeletal: Normal range of motion. She exhibits no edema or tenderness.  Neurological: She is alert and oriented to person, place, and time. No cranial nerve deficit. Coordination normal.  Gait normal Romberg normal pronator drift normal finger to nose normal DTR symmetric bilaterally knee jerk ankle jerk and biceps was ordered bilaterally  Skin: Skin is warm and dry. No rash noted.  Psychiatric: She has a normal mood and affect.  Nursing note and vitals reviewed.   ED Course  Procedures (including critical care  time) Labs Review Labs Reviewed  COMPREHENSIVE METABOLIC PANEL - Abnormal; Notable for the following:    Glucose, Bld 148 (*)    GFR calc non Af Amer 62 (*)    GFR calc Af Amer 71 (*)    All other components within normal limits  CBC WITH DIFFERENTIAL  I-STAT TROPOININ, ED    Imaging Review No results found.   EKG Interpretation   Date/Time:  Tuesday April 18 2014 14:47:26 EST Ventricular Rate:  90 PR Interval:  104 QRS Duration: 84 QT Interval:  360 QTC Calculation: 440 R Axis:   -9 Text Interpretation:  Sinus rhythm with short PR Low voltage QRS ST \\T \ T  wave abnormality, consider inferior ischemia Abnormal ECG No significant  change since last tracing Confirmed by Ethelda Chick  MD, Doreatha Martin [id number]) on  04/18/2014 4:59:40 PM        Results for orders placed or performed during the hospital encounter of 04/18/14  Comprehensive metabolic panel  Result Value Ref Range   Sodium 143 137 - 147 mEq/L   Potassium 3.7 3.7 - 5.3 mEq/L   Chloride 105 96 - 112 mEq/L   CO2 23 19 - 32 mEq/L   Glucose, Bld 148 (H) 70 - 99 mg/dL   BUN 15 6 - 23 mg/dL   Creatinine, Ser 1.91 0.50 - 1.10 mg/dL   Calcium 9.7 8.4 - 47.8 mg/dL   Total Protein 7.4 6.0 - 8.3 g/dL   Albumin 4.1 3.5 - 5.2 g/dL   AST 18 0 - 37 U/L   ALT 16 0 - 35 U/L   Alkaline Phosphatase 95 39 - 117 U/L   Total Bilirubin 0.4 0.3 - 1.2 mg/dL   GFR calc non Af Amer 62 (L) >90 mL/min   GFR calc Af Amer 71 (L) >90 mL/min   Anion gap 15 5 - 15  CBC with Differential  Result Value Ref Range   WBC 7.6 4.0 - 10.5 K/uL   RBC 4.97 3.87 - 5.11 MIL/uL   Hemoglobin 14.4 12.0 - 15.0 g/dL   HCT 29.5 62.1 - 30.8 %   MCV 90.5 78.0 - 100.0 fL   MCH 29.0 26.0 - 34.0 pg   MCHC 32.0 30.0 - 36.0 g/dL   RDW 65.7 84.6 - 96.2 %   Platelets 282 150 - 400 K/uL   Neutrophils Relative % 69 43 - 77 %   Neutro Abs 5.3 1.7 - 7.7 K/uL   Lymphocytes Relative 22 12 - 46 %   Lymphs Abs 1.7 0.7 - 4.0 K/uL   Monocytes Relative 6 3 - 12 %    Monocytes Absolute 0.4 0.1 - 1.0 K/uL   Eosinophils Relative 2 0 - 5 %   Eosinophils Absolute 0.1 0.0 - 0.7 K/uL   Basophils Relative 1 0 - 1 %   Basophils Absolute 0.1 0.0 - 0.1 K/uL  Ethanol  Result Value Ref Range   Alcohol, Ethyl (B) <11 0 - 11 mg/dL  Protime-INR  Result Value Ref Range   Prothrombin Time 14.0 11.6 - 15.2 seconds   INR 1.07 0.00 - 1.49  APTT  Result Value Ref Range   aPTT 28 24 - 37 seconds  CBC  Result Value Ref Range   WBC 7.1 4.0 - 10.5 K/uL   RBC 4.83 3.87 - 5.11 MIL/uL   Hemoglobin 14.4 12.0 - 15.0 g/dL   HCT 95.2 84.1 - 32.4 %   MCV 91.3 78.0 - 100.0 fL   MCH 29.8 26.0 - 34.0 pg   MCHC 32.7 30.0 - 36.0 g/dL   RDW 40.1 02.7 - 25.3 %   Platelets 274 150 - 400 K/uL  Differential  Result Value Ref Range   Neutrophils Relative % 62 43 - 77 %   Neutro Abs 4.5 1.7 - 7.7 K/uL   Lymphocytes Relative 28 12 - 46 %   Lymphs Abs 2.0 0.7 - 4.0 K/uL   Monocytes Relative 6 3 - 12 %   Monocytes Absolute 0.4 0.1 - 1.0 K/uL   Eosinophils Relative 3 0 - 5 %   Eosinophils Absolute 0.2 0.0 - 0.7 K/uL   Basophils Relative 1 0 - 1 %   Basophils Absolute 0.1 0.0 - 0.1 K/uL  Comprehensive metabolic panel  Result Value Ref Range   Sodium  143 137 - 147 mEq/L   Potassium 3.8 3.7 - 5.3 mEq/L   Chloride 105 96 - 112 mEq/L   CO2 24 19 - 32 mEq/L   Glucose, Bld 107 (H) 70 - 99 mg/dL   BUN 13 6 - 23 mg/dL   Creatinine, Ser 4.09 0.50 - 1.10 mg/dL   Calcium 9.2 8.4 - 81.1 mg/dL   Total Protein 7.2 6.0 - 8.3 g/dL   Albumin 3.9 3.5 - 5.2 g/dL   AST 17 0 - 37 U/L   ALT 15 0 - 35 U/L   Alkaline Phosphatase 90 39 - 117 U/L   Total Bilirubin 0.4 0.3 - 1.2 mg/dL   GFR calc non Af Amer 63 (L) >90 mL/min   GFR calc Af Amer 74 (L) >90 mL/min   Anion gap 14 5 - 15  Urinalysis, Routine w reflex microscopic  Result Value Ref Range   Color, Urine YELLOW YELLOW   APPearance CLEAR CLEAR   Specific Gravity, Urine 1.018 1.005 - 1.030   pH 5.5 5.0 - 8.0   Glucose, UA NEGATIVE  NEGATIVE mg/dL   Hgb urine dipstick NEGATIVE NEGATIVE   Bilirubin Urine NEGATIVE NEGATIVE   Ketones, ur NEGATIVE NEGATIVE mg/dL   Protein, ur NEGATIVE NEGATIVE mg/dL   Urobilinogen, UA 0.2 0.0 - 1.0 mg/dL   Nitrite NEGATIVE NEGATIVE   Leukocytes, UA TRACE (A) NEGATIVE  Urine microscopic-add on  Result Value Ref Range   Squamous Epithelial / LPF RARE RARE   WBC, UA 3-6 <3 WBC/hpf   RBC / HPF 0-2 <3 RBC/hpf   Bacteria, UA RARE RARE  I-Stat Troponin, ED (not at [facility])  Result Value Ref Range   Troponin i, poc 0.00 0.00 - 0.08 ng/mL   Comment 3           Dg Chest 2 View  03/31/2014   CLINICAL DATA:  Cough and congestion for 7 months  EXAM: CHEST  2 VIEW  COMPARISON:  June 03, 2012  FINDINGS: There is mild scar in the left base. There is no edema or consolidation. There are calcified granulomas in the right upper lobe. There are calcified lymph nodes in the right paratracheal/azygos regions. No adenopathy is appreciable. The heart size and pulmonary vascularity are normal. There is prominent epicardial fat on the right, stable. There is a hiatal hernia. There are no blastic or lytic bone lesions.  IMPRESSION: Evidence of prior granulomatous disease. Hiatal hernia present. Mild scarring left base. No edema or consolidation.   Electronically Signed   By: Bretta Bang M.D.   On: 03/31/2014 09:32   Mr Brain Wo Contrast  04/18/2014   CLINICAL DATA:  80 year old female with tingling perioral region, tongue and finger tips. Initial encounter.  EXAM: MRI HEAD WITHOUT CONTRAST  TECHNIQUE: Multiplanar, multiecho pulse sequences of the brain and surrounding structures were obtained without intravenous contrast.  COMPARISON:  12/10/2012.  FINDINGS: Questionable tiny acute infarct posterior left caudate head.  No intracranial hemorrhage.  Mild small vessel disease type changes.  Global atrophy without hydrocephalus.  No intracranial mass lesion noted on this unenhanced exam.  Partially empty non expanded  sella. Infundibulum deviates slightly to the right. No discrete pituitary mass identified.  Mild cervical spondylotic changes upper cervical spine which transverse ligament hypertrophy. Cervical medullary junction unremarkable.  Major intracranial vascular structures are patent. Atherosclerotic type changes vertebral artery bilaterally. Ectatic basilar artery.  IMPRESSION: Questionable tiny acute infarct posterior left caudate head.  No intracranial hemorrhage.  Mild small vessel  disease type changes.  Global atrophy without hydrocephalus.  No intracranial mass lesion noted on this unenhanced exam.  Mild cervical spondylotic changes upper cervical spine which transverse ligament hypertrophy.  Major intracranial vascular structures are patent. Atherosclerotic type changes vertebral artery bilaterally. Ectatic basilar artery.   Electronically Signed   By: Bridgett LarssonSteve  Olson M.D.   On: 04/18/2014 19:25    8:55 PM patient remains alert Glasgow Coma Score 15. Continues to complain of numbness around her lips. Numbness at her fingertips left hand has resolved. MDM  I consulted neurology Dr. Roseanne RenoStewart who reviewed MRI scan and feels the patient has suffered an acute stroke which does not correlate clinically to her symptoms. He does feel that she requires inpatient evaluation for stroke. He requests admission to hospitalist service. Spoke with Dr.Khan who will arrange for admission. Aspirin ordered Final diagnoses:  None  Dx#1 acute stroke #2 hyperglycemia     Doug SouSam Lateshia Schmoker, MD 04/18/14 2126  Doug SouSam Rhyder Bratz, MD 04/18/14 2127

## 2014-04-18 NOTE — ED Notes (Signed)
C/o  nimbness and tingling in lips, tongue, and finger tips.  On set this a.m.  No otc meds taken.

## 2014-04-18 NOTE — Consult Note (Signed)
Referring Physician: Milta DeitersKHAN, S    Chief Complaint: Perioral tingling of new onset.  HPI: Rachael [name] is an 80 y.o. female history of coronary artery disease, hypothyroidism, hyperglycemia, osteoarthritis and gout, who woke up this morning with perioral tingling bilaterally. She also noticed tingling in fingers and hands, left greater than right. This was not new onset however. She did not notice any focal motor changes. There were no changes in speech. An MRI of her brain was obtained which showed a small acute posterior left caudate head infarction. Patient has not been on antiplatelet therapy. NIH stroke score was 0.  LSN: 11 PM on 04/17/2014 tPA Given: No: Past time window for TPA as well as no objective deficits mRankin:  Past Medical History  Diagnosis Date  . GERD (gastroesophageal reflux disease)   . Gout     has seen Dr. Alben DeedsJames Beekman  . CAD (coronary artery disease)     sees Dr. Antoine PocheHochrein, normal Myoview stress test 02-26-12   . Hypothyroidism   . Hyperglycemia   . Depression   . Anemia   . Diverticulitis of colon   . Arthritis   . Right knee DJD   . H/O hiatal hernia   . Supraventricular tachycardia 04/07/12    In Dr Sherene SiresWainer's office - wore heart monitor for 3 weeks   . Shortness of breath     with anxiety attacks  . Anxiety   . Lightheadedness   . Osteoarthritis     Family history: Negative for stroke; positive for hypertension and diabetes mellitus.  Medications: I have reviewed the patient's current medications.  ROS: History obtained from the patient and patient's daughter  General ROS: negative for - chills, fatigue, fever, night sweats, weight gain or weight loss Psychological ROS: negative for - behavioral disorder, hallucinations, memory difficulties, mood swings or suicidal ideation Ophthalmic ROS: negative for - blurry vision, double vision, eye pain or loss of vision ENT ROS: negative for - epistaxis, nasal discharge, oral lesions, sore throat,  tinnitus or vertigo Allergy and Immunology ROS: negative for - hives or itchy/watery eyes Hematological and Lymphatic ROS: negative for - bleeding problems, bruising or swollen lymph nodes Endocrine ROS: negative for - galactorrhea, hair pattern changes, polydipsia/polyuria or temperature intolerance Respiratory ROS: negative for - cough, hemoptysis, shortness of breath or wheezing Cardiovascular ROS: negative for - chest pain, dyspnea on exertion, edema or irregular heartbeat Gastrointestinal ROS: negative for - abdominal pain, diarrhea, hematemesis, nausea/vomiting or stool incontinence Genito-Urinary ROS: negative for - dysuria, hematuria, incontinence or urinary frequency/urgency Musculoskeletal ROS: negative for - joint swelling or muscular weakness Neurological ROS: as noted in HPI Dermatological ROS: negative for rash and skin lesion changes  Physical Examination: Blood pressure 127/67, pulse 88, temperature 98.7 F (37.1 C), temperature source Oral, resp. rate 22, height 5\' 4"  (1.626 m), weight 75.8 kg (167 lb 1.7 oz), SpO2 96 %.  Neurologic Examination: Mental Status: Alert, oriented, thought content appropriate.  Speech fluent without evidence of aphasia. Able to follow commands without difficulty. Cranial Nerves: II-Visual fields were normal. III/IV/VI-Pupils were equal and reacted. Extraocular movements were full and conjugate.    V/VII-no facial numbness and no facial weakness. VIII-normal. X-normal speech and symmetrical palatal movement. Motor: 5/5 bilaterally with normal tone and bulk Sensory: Normal throughout. Deep Tendon Reflexes: 1+ and symmetric. Plantars: Flexor bilaterally Cerebellar: Normal finger-to-nose testing. Carotid auscultation: Normal  Mr Brain Wo Contrast  04/18/2014   CLINICAL DATA:  80 year old female with tingling perioral region, tongue and finger tips.  Initial encounter.  EXAM: MRI HEAD WITHOUT CONTRAST  TECHNIQUE: Multiplanar, multiecho pulse  sequences of the brain and surrounding structures were obtained without intravenous contrast.  COMPARISON:  12/10/2012.  FINDINGS: Questionable tiny acute infarct posterior left caudate head.  No intracranial hemorrhage.  Mild small vessel disease type changes.  Global atrophy without hydrocephalus.  No intracranial mass lesion noted on this unenhanced exam.  Partially empty non expanded sella. Infundibulum deviates slightly to the right. No discrete pituitary mass identified.  Mild cervical spondylotic changes upper cervical spine which transverse ligament hypertrophy. Cervical medullary junction unremarkable.  Major intracranial vascular structures are patent. Atherosclerotic type changes vertebral artery bilaterally. Ectatic basilar artery.  IMPRESSION: Questionable tiny acute infarct posterior left caudate head.  No intracranial hemorrhage.  Mild small vessel disease type changes.  Global atrophy without hydrocephalus.  No intracranial mass lesion noted on this unenhanced exam.  Mild cervical spondylotic changes upper cervical spine which transverse ligament hypertrophy.  Major intracranial vascular structures are patent. Atherosclerotic type changes vertebral artery bilaterally. Ectatic basilar artery.   Electronically Signed   By: Bridgett LarssonSteve  Olson M.D.   On: 04/18/2014 19:25    Assessment: 80 y.o. female presenting with acute small left ischemic infarction involving the head of the caudate nucleus.  Stroke Risk Factors - none  Plan: 1. HgbA1c, fasting lipid panel 2. MRA  of the brain without contrast 3. PT consult, OT consult 4. Echocardiogram 5. Carotid dopplers 6. Prophylactic therapy-Antiplatelet med: Aspirin  7. Risk factor modification 8. Telemetry monitoring   C.R. Roseanne RenoStewart, MD [facility] [phone]  04/18/2014, 11:05 PM

## 2014-04-19 ENCOUNTER — Encounter (HOSPITAL_COMMUNITY): Payer: Self-pay | Admitting: *Deleted

## 2014-04-19 ENCOUNTER — Inpatient Hospital Stay (HOSPITAL_COMMUNITY): Payer: Medicare Other

## 2014-04-19 ENCOUNTER — Ambulatory Visit: Payer: Medicare Other | Admitting: Family Medicine

## 2014-04-19 DIAGNOSIS — E785 Hyperlipidemia, unspecified: Secondary | ICD-10-CM

## 2014-04-19 DIAGNOSIS — I6350 Cerebral infarction due to unspecified occlusion or stenosis of unspecified cerebral artery: Secondary | ICD-10-CM

## 2014-04-19 DIAGNOSIS — I359 Nonrheumatic aortic valve disorder, unspecified: Secondary | ICD-10-CM

## 2014-04-19 LAB — GLUCOSE, CAPILLARY: GLUCOSE-CAPILLARY: 93 mg/dL (ref 70–99)

## 2014-04-19 LAB — LIPID PANEL
CHOL/HDL RATIO: 4.1 ratio
CHOLESTEROL: 173 mg/dL (ref 0–200)
HDL: 42 mg/dL (ref >39)
LDL CALC: 105 mg/dL — AB (ref 0–99)
Triglycerides: 129 mg/dL (ref <150)
VLDL: 26 mg/dL (ref 0–40)

## 2014-04-19 LAB — HEMOGLOBIN A1C
Hgb A1c MFr Bld: 5.8 % — ABNORMAL HIGH (ref <5.7)
Mean Plasma Glucose: 120 mg/dL — ABNORMAL HIGH (ref <117)

## 2014-04-19 MED ORDER — ATORVASTATIN CALCIUM 80 MG PO TABS: 80.0000 mg | ORAL_TABLET | Freq: Every day | ORAL | Status: DC

## 2014-04-19 MED ORDER — ATORVASTATIN CALCIUM 80 MG PO TABS
80.0000 mg | ORAL_TABLET | Freq: Every day | ORAL | Status: DC
Start: 2014-04-19 — End: 2014-04-19

## 2014-04-19 MED ORDER — IOHEXOL 350 MG/ML SOLN
80.0000 mL | Freq: Once | INTRAVENOUS | Status: AC | PRN
  Administered 2014-04-19: 80 mL via INTRAVENOUS

## 2014-04-19 MED ORDER — STROKE: EARLY STAGES OF RECOVERY BOOK
Freq: Once | Status: AC
  Administered 2014-04-19: 06:00:00
  Filled 2014-04-19: qty 1

## 2014-04-19 MED ORDER — ASPIRIN EC 325 MG PO TBEC: 325.0000 mg | DELAYED_RELEASE_TABLET | Freq: Every day | ORAL | Status: DC

## 2014-04-19 NOTE — Evaluation (Addendum)
Occupational Therapy Evaluation Patient Details Name: Rachael StarksDorothea J [name] MRN: [id number] DOB: 09/26/1930 Today's Date: 04/19/2014    History of Present Illness 80 y.o. MRI revealed questionable small acute posterior left caudate head infarction.   Clinical Impression   Pt admitted with above. Education provided during session and pt with decreased safety awareness. Pt with apparent vision deficit. Recommending pt get a full visual field assessment at opthamologist ([place] 120.3) and recommended No driving until she gets vision test. Also, recommending Outpatient OT for followup depending on results of full visual field assessment.     Follow Up Recommendations  Outpatient OT;Supervision - Intermittent    Equipment Recommendations  None recommended by OT    Recommendations for Other Services       Precautions / Restrictions Precautions Precautions: Fall Restrictions Weight Bearing Restrictions: No      Mobility Bed Mobility Overal bed mobility: Modified Independent                Transfers Overall transfer level: Needs assistance   Transfers: Sit to/from Stand Sit to Stand: Supervision              Balance                                            ADL Overall ADL's : Needs assistance/impaired                     Lower Body Dressing: Supervision/safety;Sit to/from stand   Toilet Transfer: Supervision/safety;Ambulation (bed)           Functional mobility during ADLs: Supervision/safety General ADL Comments: Educated on safety-safe shoewear, rugs, sitting for LB ADLs. Educated on safe tub transfer technique. Discussed d/c recommendations and OT recommended no driving until pt gets the vision assessment completed that was written on paper for her. OT explained safety issue with driving and apparent impairment in peripheral vision. Pt became upset in session. Discussed how to compensate for apparent vision deficit. Educated on  BE FAST stroke education and importance of getting help right away. Educated on energy conservation. Discussed daughter being with pt getting in and out of tub-pt not receptive of this. Had pt try to tell me the stroke symptoms that OT had went over with her.     Vision  Pt has cataracts and glasses for reading.   Visual fields: Pt inconsistent in right lower quadrant of peripheral vision.                    Perception     Praxis      Pertinent Vitals/Pain Pain Assessment: Faces Faces Pain Scale: No hurt     Hand Dominance     Extremity/Trunk Assessment Upper Extremity Assessment Upper Extremity Assessment: LUE deficits/detail LUE Deficits / Details: shoulder flexion slightly weaker compared to RUE   Lower Extremity Assessment Lower Extremity Assessment: Defer to PT evaluation       Communication Communication Communication: No difficulties   Cognition Arousal/Alertness: Awake/alert Behavior During Therapy: WFL for tasks assessed/performed Overall Cognitive Status:  (unsure of baseline) Area of Impairment: Safety/judgement; short term memory  Decreased short term memory         Safety/Judgement: Decreased awareness of safety;Decreased awareness of deficits         General Comments       Exercises       Shoulder  Instructions      Home Living Family/patient expects to be discharged to:: Private residence Living Arrangements: Children Available Help at Discharge: Family Type of Home: House Home Access: Stairs to enter Secretary/administratorntrance Stairs-Number of Steps: 1 Entrance Stairs-Rails: Can reach both;Right;Left Home Layout: Multi-level;Able to live on main level with bedroom/bathroom     Bathroom Shower/Tub: Tub/shower unit         Home Equipment: Cane - single point;Shower seat   Additional Comments: Pt lives with daughter and is able to live on main floor of house. Pt uses cane for community ambulation but does not use ADs at home.      Prior  Functioning/Environment Level of Independence: Independent             OT Diagnosis: Disturbance of vision   OT Problem List: Impaired balance (sitting and/or standing);Impaired vision/perception;Decreased activity tolerance;Decreased safety awareness;Decreased knowledge of precautions   OT Treatment/Interventions:      OT Goals(Current goals can be found in the care plan section)    OT Frequency:     Barriers to D/C:            Co-evaluation              End of Session Nurse Communication: Other (comment) (vision; d/c recommendations)  Activity Tolerance: Patient tolerated treatment well Patient left: in bed;with call bell/phone within reach   Time: [id number] OT Time Calculation (min): 39 min Charges:  OT General Charges $OT Visit: 1 Procedure OT Evaluation $Initial OT Evaluation Tier I: 1 Procedure OT Treatments $Therapeutic Activity: 8-22 mins G-CodesEarlie Raveling:    Brandan Glauber L OTR/L [phone] 04/19/2014, 3:55 PM

## 2014-04-19 NOTE — Progress Notes (Signed)
SLP Cancellation Note  Patient Details Name: Rachael StarksDorothea J [name] MRN: [id number] DOB: 09/12/1930   Cancelled treatment:       Reason Eval/Treat Not Completed: SLP screened, no needs identified, will sign off   Blenda MountsCouture, Jaxxen Voong Laurice 04/19/2014, 10:39 AM

## 2014-04-19 NOTE — Progress Notes (Signed)
  Echocardiogram 2D Echocardiogram has been performed.  Rachael [name] 04/19/2014, 9:48 AM

## 2014-04-19 NOTE — Discharge Summary (Addendum)
Physician Discharge Summary  Rachael StarksDorothea J [name] KGM:[id number] DOB: 12/22/1930 DOA: 04/18/2014  PCP: Rachael [name]  Admit date: 04/18/2014 Discharge date: 04/19/2014  Time spent: 30 minutes  Recommendations for Outpatient Follow-up:  D/c home with outpt PCP and neurology follow up   Discharge Diagnoses:  Principle problem:  TIA vs acute ischemic stroke  Active Problems:   Hypothyroidism   Dyslipidemia   Coronary atherosclerosis   CAD (coronary artery disease)    Discharge Condition: fair  Diet recommendation: heart healthy  Filed Weights   04/18/14 1450 04/18/14 2219 04/19/14 0358  Weight: 75.751 kg (167 lb) 75.8 kg (167 lb 1.7 oz) 76 kg (167 lb 8.8 oz)    History of present illness:  80 y.o. Female with hx of CAD, GERD, hiatal hernia s/p nissen fundoplication, arthritis, hypothyroidism presents with numbness around the mouth when  she woke up on the day of admission.  The symptoms did not resolve so she decided to come into the ED. Patient was evalauted here with a MRI and this shows a possible acute infarct of the caudate. She didno have any other symptoms and  her perioral numbness has resolved in the ED. Patient admitted for stroke w/up and neurology consulted.     Hospital Course:  Acute CVA Vs TIA MRI brain findings as below. Placed on full dose ASA and lipitor 80 mg daily given elevated LDL. BP and HR stable. No further neurological changes noted. Seen by stroke team, not very convinced MRI findings to be true stroke . A CT angiogram of head and neck done  Showed no acute intracranial abnormality.  carotid doppler with b./l noon significant stenosis.  2D echo with normal EF and no wall motion abnormality. Patient weill be discharged on ASA 325 mg ( enteric coated) and lipitor 80 mg daily. Discussed secondary stroke prevention.  Follow up with PCP and Rachael [name] as outpt  CAD Stable. follow with Rachael [name]  GERD  continue ranitidine  hypothyroidism   continue synthroid   Procedures:  MRI brain  CT angio head and neck   2D echo and carotid doppler    Consultations:  neurology  Discharge Exam: Filed Vitals:   04/19/14 1410  BP: 115/61  Pulse:   Temp:   Resp:     General: elderly female in NAD HEENT: no pallor, moist oral mucosa Chest: clear b/l, no added sounds  CVS: N S1&S2, no murmurs Abd: soft, NT, ND Ext: warm, no edema  CNS: AAOX3, no focal    Discharge Instructions You were cared for by a hospitalist during your hospital stay. If you have any questions about your discharge medications or the care you received while you were in the hospital after you are discharged, you can call the unit and asked to speak with the hospitalist on call if the hospitalist that took care of you is not available. Once you are discharged, your primary care physician will handle any further medical issues. Please note that NO REFILLS for any discharge medications will be authorized once you are discharged, as it is imperative that you return to your primary care physician (or establish a relationship with a primary care physician if you do not have one) for your aftercare needs so that they can reassess your need for medications and monitor your lab values.   Current Discharge Medication List    START taking these medications   Details  aspirin EC 325 MG tablet Take 1 tablet (325 mg total) by mouth daily. Qty:  30 tablet, Refills: 0    atorvastatin (LIPITOR) 80 MG tablet Take 1 tablet (80 mg total) by mouth daily. Qty: 30 tablet, Refills: 0      CONTINUE these medications which have NOT CHANGED   Details  allopurinol (ZYLOPRIM) 100 MG tablet Take 1 tablet (100 mg total) by mouth daily. Qty: 90 tablet, Refills: 3    levothyroxine (SYNTHROID, LEVOTHROID) 100 MCG tablet Take 1 tablet (100 mcg total) by mouth daily before breakfast. Qty: 90 tablet, Refills: 3    neomycin-polymyxin-dexamethasone (MAXITROL) 0.1 % ophthalmic  suspension Place 1 drop into both eyes 4 (four) times daily. Qty: 5 mL, Refills: 5    nitroGLYCERIN (NITROSTAT) 0.3 MG SL tablet Place 1 tablet (0.3 mg total) under the tongue every 5 (five) minutes as needed for chest pain. Qty: 25 tablet, Refills: 3    nystatin cream (MYCOSTATIN) Apply 1 application topically 2 (two) times daily. Qty: 30 g, Refills: 11    ranitidine (ZANTAC) 150 MG tablet Take 1 tablet (150 mg total) by mouth at bedtime. Qty: 90 tablet, Refills: 3    benzonatate (TESSALON) 100 MG capsule Take 1 capsule (100 mg total) by mouth 2 (two) times daily as needed for cough. Qty: 42 capsule, Refills: 0    HYDROcodone-acetaminophen (NORCO/VICODIN) 5-325 MG per tablet Take 1 tablet by mouth every 6 (six) hours as needed for moderate pain. Qty: 120 tablet, Refills: 0    terbinafine (LAMISIL) 250 MG tablet Take 1 tablet (250 mg total) by mouth daily. Qty: 90 tablet, Refills: 0      STOP taking these medications     diclofenac (VOLTAREN) 75 MG EC tablet        Allergies  Allergen Reactions  . Demerol [Meperidine]   . Meperidine Hcl Other (See Comments)    Unknown   . Sulfonamide Derivatives Other (See Comments)    Happened about 15 years ago   Follow-up Information    Follow up with Rachael [name] On 04/26/2014.   Specialty:  Family Medicine   Why:  Hospital Follow Up @ 1:00pm   Contact information:   [address] [place] [address] [phone]       Follow up with Rachael [name] In 2 months.   Specialties:  Neurology, Radiology   Contact information:   [address] [place] [address] [phone]        The results of significant diagnostics from this hospitalization (including imaging, microbiology, ancillary and laboratory) are listed below for reference.    Significant Diagnostic Studies: Ct Angio Head W/cm &/or Wo Cm  04/19/2014   CLINICAL DATA:  80 year old female with. Oral numbness. Extremity tingling.  Possible tiny left caudate nucleus lacunar infarct on MRI. Initial encounter.  EXAM: CT ANGIOGRAPHY HEAD AND NECK  TECHNIQUE: Multidetector CT imaging of the head and neck was performed using the standard protocol during bolus administration of intravenous contrast. Multiplanar CT image reconstructions and MIPs were obtained to evaluate the vascular anatomy. Carotid stenosis measurements (when applicable) are obtained utilizing NASCET criteria, using the distal internal carotid diameter as the denominator.  CONTRAST:  80mL OMNIPAQUE IOHEXOL 350 MG/ML SOLN  COMPARISON:  Brain MRI 04/18/2014, and earlier. Intracranial MRA 12/10/2012.  FINDINGS: CTA HEAD FINDINGS  Calvarium intact. Visualized orbits and scalp soft tissues are within normal limits.  No ventriculomegaly. No midline shift, mass effect, or evidence of intracranial mass lesion. No acute intracranial hemorrhage identified. Gray-white matter differentiation within normal limits for age. No abnormal enhancement identified.  VASCULAR FINDINGS:  Major intracranial venous structures are enhancing.  Mildly dominant distal left vertebral artery. Normal PICA origins. Normal vertebrobasilar junction. No basilar stenosis, mild ectasia. Normal SCA and left PCA origins (shared left PCA and SCA origin.). Fetal type bilateral PCA origins. Bilateral PCA branches are within normal limits.  Dolichoectasia of both ICA siphons. Mild calcified plaque with no stenosis. Ophthalmic and posterior communicating artery origins are normal. Patent carotid termini. Normal MCA and ACA origins. Dominant left A1 segment. Ectatic anterior communicating artery is stable with a median artery of the corpus callosum. Bilateral ACA branches are within normal limits. Right MCA branches are within normal limits. Left MCA branches are within normal limits.  Review of the MIP images confirms the above findings.  CTA NECK FINDINGS  Dependent opacity in the lung apices most resembles atelectasis.  Small superior mediastinal lymph nodes. Negative thyroid, larynx, pharynx, parapharyngeal spaces, retropharyngeal space, sublingual space, submandibular glands, and parotid glands. Visualized orbit soft tissues are within normal limits. Visualized paranasal sinuses and mastoids are clear. Advanced degenerative changes in the cervical spine. Scoliosis. No acute osseous abnormality identified. No cervical lymphadenopathy.  VASCULAR FINDINGS:  3 vessel arch configuration.  Mild for age arch atherosclerosis.  Tortuous brachiocephalic artery with a kinked appearance just beyond its origin (series 403, image 109). Patent left CCA origin, but tortuosity with a kinked appearance (coronal image 99). Partially retropharyngeal course of the right carotid. Calcified plaque at the right carotid bifurcation but no right ICA origin or bulb stenosis. Tortuous cervical right ICA.  Mildly kinked appearance of the right subclavian artery origin. Tortuous right vertebral artery origin with no definite stenosis. Nondominant right vertebral artery, otherwise normal to the skullbase.  Tortuous proximal left CCA with no stenosis. Partially retropharyngeal course. Soft and calcified plaque at the left carotid bifurcation with no ICA origin stenosis. Soft plaque in the distal bulb results in stenosis of less than 50 % with respect to the distal vessel (sagittal series 406, image 117). The on the bulb the cervical left ICA is tortuous but otherwise normal.  Tortuous proximal left subclavian artery origin with a kinked appearance (coronal image 123). Dominant left vertebral artery origin is normal. The left vertebral artery is tortuous throughout the neck, but otherwise normal to the skullbase.  Review of the MIP images confirms the above findings.  IMPRESSION: 1. Dolichoectatic great vessels and ICA siphons. Kinked appearance of multiple proximal great vessels do to tortuosity. 2. Stable compared to the 2014 intracranial MRA. Atherosclerosis  but no hemodynamically significant stenosis in the head or neck. 3. No acute intracranial abnormality. 4. No acute findings identified in the neck.   Electronically Signed   By: Augusto Gamble M.D.   On: 04/19/2014 12:55   Dg Chest 2 View  03/31/2014   CLINICAL DATA:  Cough and congestion for 7 months  EXAM: CHEST  2 VIEW  COMPARISON:  June 03, 2012  FINDINGS: There is mild scar in the left base. There is no edema or consolidation. There are calcified granulomas in the right upper lobe. There are calcified lymph nodes in the right paratracheal/azygos regions. No adenopathy is appreciable. The heart size and pulmonary vascularity are normal. There is prominent epicardial fat on the right, stable. There is a hiatal hernia. There are no blastic or lytic bone lesions.  IMPRESSION: Evidence of prior granulomatous disease. Hiatal hernia present. Mild scarring left base. No edema or consolidation.   Electronically Signed   By: Bretta Bang M.D.   On:  03/31/2014 09:32   Ct Angio Neck W/cm &/or Wo/cm  04/19/2014   CLINICAL DATA:  80 year old female with. Oral numbness. Extremity tingling. Possible tiny left caudate nucleus lacunar infarct on MRI. Initial encounter.  EXAM: CT ANGIOGRAPHY HEAD AND NECK  TECHNIQUE: Multidetector CT imaging of the head and neck was performed using the standard protocol during bolus administration of intravenous contrast. Multiplanar CT image reconstructions and MIPs were obtained to evaluate the vascular anatomy. Carotid stenosis measurements (when applicable) are obtained utilizing NASCET criteria, using the distal internal carotid diameter as the denominator.  CONTRAST:  80mL OMNIPAQUE IOHEXOL 350 MG/ML SOLN  COMPARISON:  Brain MRI 04/18/2014, and earlier. Intracranial MRA 12/10/2012.  FINDINGS: CTA HEAD FINDINGS  Calvarium intact. Visualized orbits and scalp soft tissues are within normal limits.  No ventriculomegaly. No midline shift, mass effect, or evidence of intracranial mass  lesion. No acute intracranial hemorrhage identified. Gray-white matter differentiation within normal limits for age. No abnormal enhancement identified.  VASCULAR FINDINGS:  Major intracranial venous structures are enhancing.  Mildly dominant distal left vertebral artery. Normal PICA origins. Normal vertebrobasilar junction. No basilar stenosis, mild ectasia. Normal SCA and left PCA origins (shared left PCA and SCA origin.). Fetal type bilateral PCA origins. Bilateral PCA branches are within normal limits.  Dolichoectasia of both ICA siphons. Mild calcified plaque with no stenosis. Ophthalmic and posterior communicating artery origins are normal. Patent carotid termini. Normal MCA and ACA origins. Dominant left A1 segment. Ectatic anterior communicating artery is stable with a median artery of the corpus callosum. Bilateral ACA branches are within normal limits. Right MCA branches are within normal limits. Left MCA branches are within normal limits.  Review of the MIP images confirms the above findings.  CTA NECK FINDINGS  Dependent opacity in the lung apices most resembles atelectasis. Small superior mediastinal lymph nodes. Negative thyroid, larynx, pharynx, parapharyngeal spaces, retropharyngeal space, sublingual space, submandibular glands, and parotid glands. Visualized orbit soft tissues are within normal limits. Visualized paranasal sinuses and mastoids are clear. Advanced degenerative changes in the cervical spine. Scoliosis. No acute osseous abnormality identified. No cervical lymphadenopathy.  VASCULAR FINDINGS:  3 vessel arch configuration.  Mild for age arch atherosclerosis.  Tortuous brachiocephalic artery with a kinked appearance just beyond its origin (series 403, image 109). Patent left CCA origin, but tortuosity with a kinked appearance (coronal image 99). Partially retropharyngeal course of the right carotid. Calcified plaque at the right carotid bifurcation but no right ICA origin or bulb  stenosis. Tortuous cervical right ICA.  Mildly kinked appearance of the right subclavian artery origin. Tortuous right vertebral artery origin with no definite stenosis. Nondominant right vertebral artery, otherwise normal to the skullbase.  Tortuous proximal left CCA with no stenosis. Partially retropharyngeal course. Soft and calcified plaque at the left carotid bifurcation with no ICA origin stenosis. Soft plaque in the distal bulb results in stenosis of less than 50 % with respect to the distal vessel (sagittal series 406, image 117). The on the bulb the cervical left ICA is tortuous but otherwise normal.  Tortuous proximal left subclavian artery origin with a kinked appearance (coronal image 123). Dominant left vertebral artery origin is normal. The left vertebral artery is tortuous throughout the neck, but otherwise normal to the skullbase.  Review of the MIP images confirms the above findings.  IMPRESSION: 1. Dolichoectatic great vessels and ICA siphons. Kinked appearance of multiple proximal great vessels do to tortuosity. 2. Stable compared to the 2014 intracranial MRA. Atherosclerosis but no hemodynamically significant stenosis  in the head or neck. 3. No acute intracranial abnormality. 4. No acute findings identified in the neck.   Electronically Signed   By: Augusto Gamble M.D.   On: 04/19/2014 12:55   Mr Brain Wo Contrast  04/18/2014   CLINICAL DATA:  80 year old female with tingling perioral region, tongue and finger tips. Initial encounter.  EXAM: MRI HEAD WITHOUT CONTRAST  TECHNIQUE: Multiplanar, multiecho pulse sequences of the brain and surrounding structures were obtained without intravenous contrast.  COMPARISON:  12/10/2012.  FINDINGS: Questionable tiny acute infarct posterior left caudate head.  No intracranial hemorrhage.  Mild small vessel disease type changes.  Global atrophy without hydrocephalus.  No intracranial mass lesion noted on this unenhanced exam.  Partially empty non expanded sella.  Infundibulum deviates slightly to the right. No discrete pituitary mass identified.  Mild cervical spondylotic changes upper cervical spine which transverse ligament hypertrophy. Cervical medullary junction unremarkable.  Major intracranial vascular structures are patent. Atherosclerotic type changes vertebral artery bilaterally. Ectatic basilar artery.  IMPRESSION: Questionable tiny acute infarct posterior left caudate head.  No intracranial hemorrhage.  Mild small vessel disease type changes.  Global atrophy without hydrocephalus.  No intracranial mass lesion noted on this unenhanced exam.  Mild cervical spondylotic changes upper cervical spine which transverse ligament hypertrophy.  Major intracranial vascular structures are patent. Atherosclerotic type changes vertebral artery bilaterally. Ectatic basilar artery.   Electronically Signed   By: Bridgett Larsson M.D.   On: 04/18/2014 19:25    Microbiology: No results found for this or any previous visit (from the past 240 hour(s)).   Labs: Basic Metabolic Panel:  Recent Labs Lab 04/18/14 1250 04/18/14 1514 04/18/14 2023  NA 143 143 143  K 3.7 3.7 3.8  CL 108 105 105  CO2  --  23 24  GLUCOSE 105* 148* 107*  BUN 16 15 13   CREATININE 0.90 0.86 0.84  CALCIUM  --  9.7 9.2   Liver Function Tests:  Recent Labs Lab 04/18/14 1514 04/18/14 2023  AST 18 17  ALT 16 15  ALKPHOS 95 90  BILITOT 0.4 0.4  PROT 7.4 7.2  ALBUMIN 4.1 3.9   No results for input(s): LIPASE, AMYLASE in the last 168 hours. No results for input(s): AMMONIA in the last 168 hours. CBC:  Recent Labs Lab 04/18/14 1250 04/18/14 1514 04/18/14 2023  WBC  --  7.6 7.1  NEUTROABS  --  5.3 4.5  HGB 16.3* 14.4 14.4  HCT 48.0* 45.0 44.1  MCV  --  90.5 91.3  PLT  --  282 274   Cardiac Enzymes: No results for input(s): CKTOTAL, CKMB, CKMBINDEX, TROPONINI in the last 168 hours. BNP: BNP (last 3 results) No results for input(s): PROBNP in the last 8760  hours. CBG:  Recent Labs Lab 04/19/14 0749  GLUCAP 93       Signed:  Collin Rengel  [facility] 04/19/2014, 2:46 PM

## 2014-04-19 NOTE — Progress Notes (Signed)
UR Completed Osric Klopf Graves-Bigelow, RN,BSN [phone]  

## 2014-04-19 NOTE — Evaluation (Signed)
Physical Therapy Evaluation Patient Details Name: Rachael [name] MRN: [id number] DOB: 11/22/1930 Today's Date: 04/19/2014   History of Present Illness    Rachael [name] is an 80 y.o. female history of coronary artery disease, hypothyroidism, hyperglycemia, osteoarthritis and gout, who woke up this morning with perioral tingling bilaterally. She also noticed tingling in fingers and hands, left greater than right. This was not new onset however. She did not notice any focal motor changes. There were no changes in speech. An MRI of her brain was obtained which showed a small acute posterior left caudate head infarction. Patient has not been on antiplatelet therapy. NIH stroke score was 0.  Clinical Impression   Pt reported to currently be at baseline mobility. Pt safely able to perform bed mobility, transfers and ambulation and minimal cuing or assistance from PT. Pt has good home set up and family support at discharge. Pt became short of breathe after ambulating around the circle in the hallway. Pt stated that this is normal at baseline with longer community ambulation. Pt's SaO2 at this time was 93-94%. Pt does not require further acute PT assistance and does not require follow up PT at discharge. No goals will be written at this time.      Follow Up Recommendations No PT follow up    Equipment Recommendations  None recommended by PT    Recommendations for Other Services       Precautions / Restrictions Precautions Precautions: Fall Restrictions Weight Bearing Restrictions: No      Mobility  Bed Mobility Overal bed mobility: Modified Independent             General bed mobility comments: Pt able to lie self back down into bed without assistance or cuing from PT.   Transfers Overall transfer level: Needs assistance Equipment used: None Transfers: Sit to/from Stand Sit to Stand: Supervision         General transfer comment: Pt was supervision from sit to stand  transfer from bed and from recliner. Uses arms to push up from stable surface but pt is safe and did not require cuing.   Ambulation/Gait Ambulation/Gait assistance: Supervision Ambulation Distance (Feet): 200 Feet Assistive device: None Gait Pattern/deviations: Step-through pattern;Wide base of support   Gait velocity interpretation: at or above normal speed for age/gender General Gait Details: Pt able to ambulate in hallway with PT supervision. Pt able to walk around circle and stated she was starting to feel a little SOB when she got back to her room. Pt's SaO2 was 93-94% when taken outside room after ambulation. Pt stated that she normally feels short of breath when ambulating longer distances.   Stairs            Wheelchair Mobility    Modified Rankin (Stroke Patients Only)       Balance Overall balance assessment: Needs assistance Sitting-balance support: Feet unsupported;No upper extremity supported Sitting balance-Leahy Scale: Good     Standing balance support: During functional activity;No upper extremity supported Standing balance-Leahy Scale: Good Standing balance comment: Pt able to ambulate without use of AD.                              Pertinent Vitals/Pain Pain Assessment: No/denies pain    Home Living Family/patient expects to be discharged to:: Private residence Living Arrangements: Children Available Help at Discharge: Family Type of Home: House Home Access: Stairs to enter Entrance Stairs-Rails: Can reach both;Right;Left Entrance  Stairs-Number of Steps: 1 Home Layout: Multi-level;Able to live on main level with bedroom/bathroom Home Equipment: Gilmer MorCane - single point Additional Comments: Pt lives with daughter and is able to live on main floor of house. Pt uses cane for community ambulation but does not use ADs at home.    Prior Function Level of Independence: Independent               Hand Dominance        Extremity/Trunk  Assessment               Lower Extremity Assessment: Overall WFL for tasks assessed         Communication   Communication: No difficulties  Cognition Arousal/Alertness: Awake/alert Behavior During Therapy: WFL for tasks assessed/performed Overall Cognitive Status: Within Functional Limits for tasks assessed                      General Comments      Exercises General Exercises - Lower Extremity Ankle Circles/Pumps: AROM;10 reps;Both;Seated Long Arc Quad: AROM;Both;10 reps;Seated Hip ABduction/ADduction: 10 reps;Supine;AROM;Both Hip Flexion/Marching: AROM;Both;10 reps;Seated      Assessment/Plan    PT Assessment Patent does not need any further PT services  PT Diagnosis Difficulty walking   PT Problem List    PT Treatment Interventions     PT Goals (Current goals can be found in the Care Plan section)      Frequency     Barriers to discharge        Co-evaluation               End of Session Equipment Utilized During Treatment: Gait belt Activity Tolerance: Patient limited by fatigue;Patient tolerated treatment well Patient left: in bed;with call bell/phone within reach;with nursing/sitter in room           Time: 1017-1036 PT Time Calculation (min) (ACUTE ONLY): 19 min   Charges:   PT Evaluation $Initial PT Evaluation Tier I: 1 Procedure PT Treatments $Gait Training: 8-22 mins   PT G CodesYork Spaniel:          Hebert, Dereonna Lensing 04/19/2014, 10:59 AM   York Spaniellivia Hebert, SPT  Acute Rehabilitation [phone]

## 2014-04-19 NOTE — Plan of Care (Signed)
Problem: RH SKIN INTEGRITY Goal: RH STG ABLE TO PERFORM INCISION/WOUND CARE W/ASSISTANCE STG Able To Perform Incision/Wound Care With Assistance. Outcome: Not Applicable Date Met:  82/42/35  Problem: RH SAFETY Goal: RH STG DECREASED RISK OF FALL WITH ASSISTANCE STG Decreased Risk of Fall With Assistance. Outcome: Progressing

## 2014-04-19 NOTE — Discharge Instructions (Addendum)
Ischemic Stroke Blood carries oxygen to all areas of your body. A stroke happens when your blood does not flow to your brain like normal. If this happens, your brain will not get the oxygen it needs and brain tissue will die. This is an emergency. Problems (symptoms) of a stroke usually happen suddenly. You may notice them when you wake up. They can include:  Loss of feeling or weakness on one side of the body (face, arm, leg).  Feeling confused.  Trouble talking or understanding.  Trouble seeing.  Trouble walking.  Feeling dizzy.  Loss of balance or coordination.  Severe headache without a cause.  Trouble reading or writing. Get help as soon as any of these problems first start. This is important.  RISK FACTORS  Risk factors are things that make it more likely for you to have a stroke. These things include:  High blood pressure (hypertension).  High cholesterol.  Diabetes.  Heart disease.  Having a buildup of fatty deposits in the blood vessels.  Having an abnormal heart rhythm (atrial fibrillation).  Being very overweight (obese).  Smoking.  Taking birth control pills, especially if you smoke.  Not being active.  Having a diet high in fats, salt, and calories.  Drinking too much alcohol.  Using illegal drugs.  Being African American.  Being over the age of 48.  Having a family history of stroke.  Having a history of blood clots, stroke, warning stroke (transient ischemic attack, TIA), or heart attack.  Sickle cell disease. HOME CARE  Take all medicines exactly as told by your doctor. Understand all your medicine instructions.  You may need to take a medicine to thin your blood, like aspirin or warfarin. Take warfarin exactly as told.  Taking too much or too little warfarin is dangerous. Get regular blood tests as told, including the PT and INR tests. The test results help your doctor adjust your dose of warfarin. Your PT and INR levels must be done  as often as told by your doctor.  Food can cause problems with warfarin and affect the results of your blood tests. This is true for foods high in vitamin K, such as spinach, kale, broccoli, cabbage, collard and turnip greens, Brussels sprouts, peas, cauliflower, seaweed, and parsley, as well as beef and pork liver, green tea, and soybean oil. Eat the same amount of food high in vitamin K. Avoid major changes in your diet. Tell your doctor before changing your diet. Talk to a food specialist (dietitian) if you have questions.  Many medicines can cause problems with warfarin and affect your PT and INR test results. Tell your doctor about all medicines you take. This includes vitamins and dietary pills (supplements). Be careful with aspirin and medicines that relieve redness, soreness, and puffiness (inflammation). Do not take or stop medicines unless your doctor tells you to.  Warfarin can cause a lot of bruising or bleeding. Hold pressure over cuts for longer than normal. Talk to your doctor about other side effects of warfarin.  Avoid sports or activities that may cause injury or bleeding.  Be careful when you shave, floss your teeth, or use sharp objects.  Avoid alcoholic drinks or drink very little alcohol while taking warfarin. Tell your doctor if you change how much alcohol you drink.  Tell your dentist and other doctors that you take warfarin before procedures.  If you are able to swallow, eat healthy foods. Eat 5 or more servings of fruits and vegetables a day. Eat soft  foods, pureed foods, or eat small bites of food so you do not choke.  Follow your diet program as told, if you are given one.  Keep a healthy weight.  Stay active. Try to get at least 30 minutes of activity on most or all days.  Do not smoke.  Limit how much alcohol you drink even if you are not taking warfarin. Moderate alcohol use is:  No more than 2 drinks each day for men.  No more than 1 drink each day for  women who are not pregnant.  Keep your home safe so you do not fall. Try:  Putting grab bars in the bedroom and bathroom.  Raising toilet seats.  Putting a seat in the shower.  Go to therapy sessions (physical, occupational, and speech) as told by your doctor.  Use a walker or cane at all times if told to do so.  Keep all doctor visits as told. GET HELP IF:  Your personality changes.  You have trouble swallowing.  You are seeing two of everything.  You are dizzy.  You have a fever.  Your skin starts to break down. GET HELP RIGHT AWAY IF:  The symptoms below may be a sign of an emergency. Do not wait to see if the symptoms go away. Call for help (911 in [place] Do not drive yourself to the hospital.  You have sudden weakness or numbness on the face, arm, or leg (especially on one side of the body).  You have sudden trouble walking or moving your arms or legs.  You have sudden confusion.  You have trouble talking or understanding.  You have sudden trouble seeing in one or both eyes.  You lose your balance or your movements are not smooth.  You have a sudden, severe headache with no known cause.  You have new chest pain or you feel your heart beating in an unsteady way.  You are partly or totally unaware of what is going on around you. Document Released: 05/01/2011 Document Revised: 09/26/2013 Document Reviewed: 12/21/2011 [facility]ExitCare Patient Information 2015 [employer] [place]. This information is not intended to replace advice given to you by your health care provider. Make sure you discuss any questions you have with your health care provider. STROKE/TIA DISCHARGE INSTRUCTIONS SMOKING Cigarette smoking nearly doubles your risk of having a stroke & is the single most alterable risk factor  If you smoke or have smoked in the last 12 months, you are advised to quit smoking for your health. Most of the excess cardiovascular risk related to smoking disappears within a year of  stopping. Ask you doctor about anti-smoking medications [place] Quit Line: 1-800-QUIT NOW Free Smoking Cessation Classes [phone]  CHOLESTEROL Know your levels; limit fat & cholesterol in your diet  Lipid Panel     Component Value Date/Time   CHOL 173 04/19/2014 0415   TRIG 129 04/19/2014 0415   HDL 42 04/19/2014 0415   CHOLHDL 4.1 04/19/2014 0415   VLDL 26 04/19/2014 0415   LDLCALC 105* 04/19/2014 0415     Many patients benefit from treatment even if their cholesterol is at goal. Goal: Total Cholesterol (CHOL) less than 160 Goal:  Triglycerides (TRIG) less than 150 Goal:  HDL greater than 40 Goal:  LDL (LDLCALC) less than 100   BLOOD PRESSURE American Stroke Association blood pressure target is less that 120/80 mm/Hg  Your discharge blood pressure is:  BP: 115/61 mmHg Monitor your blood pressure Limit your salt and alcohol intake Many individuals  will require more than one medication for high blood pressure  DIABETES (A1c is a blood sugar average for last 3 months) Goal HGBA1c is under 7% (HBGA1c is blood sugar average for last 3 months)  Diabetes: No known diagnosis of diabetes    Lab Results  Component Value Date   HGBA1C 5.8 01/11/2013    Your HGBA1c can be lowered with medications, healthy diet, and exercise. Check your blood sugar as directed by your physician Call your physician if you experience unexplained or low blood sugars.  PHYSICAL ACTIVITY/REHABILITATION Goal is 30 minutes at least 4 days per week  Activity: Increase activity slowly, Therapies: Occupational Therapy: Outpatient Return to work:  Activity decreases your risk of heart attack and stroke and makes your heart stronger.  It helps control your weight and blood pressure; helps you relax and can improve your mood. Participate in a regular exercise program. Talk with your doctor about the best form of exercise for you (dancing, walking, swimming, cycling).  DIET/WEIGHT Goal is to maintain a healthy  weight  Your discharge diet is: Diet Carb Modified   liquids Your height is:  Height: 5\' 4"  (162.6 cm) Your current weight is: Weight: 76 kg (167 lb 8.8 oz) Your Body Mass Index (BMI) is:  BMI (Calculated): 28.7 Following the type of diet specifically designed for you will help prevent another stroke. Your goal weight range is:   Your goal Body Mass Index (BMI) is 19-24. Healthy food habits can help reduce 3 risk factors for stroke:  High cholesterol, hypertension, and excess weight.  RESOURCES Stroke/Support Group:  Call [phone]   STROKE EDUCATION PROVIDED/REVIEWED AND GIVEN TO PATIENT Stroke warning signs and symptoms How to activate emergency medical system (call 911). Medications prescribed at discharge. Need for follow-up after discharge. Personal risk factors for stroke. Pneumonia vaccine given: No Flu vaccine given: No My questions have been answered, the writing is legible, and I understand these instructions.  I will adhere to these goals & educational materials that have been provided to me after my discharge from the hospital.

## 2014-04-19 NOTE — Progress Notes (Signed)
Pt very anxious about going home. Educated pt about Discharge process. Will await MD orders. Cecille Rubinhompson,Kember Boch V, RN

## 2014-04-19 NOTE — Care Management Note (Addendum)
    Page 1 of 1   04/19/2014     4:15:03 PM CARE MANAGEMENT NOTE 04/19/2014  Patient:  Versie StarksWRIGHT,Dorene J   Account Number:  [id number]  Date Initiated:  04/19/2014  Documentation initiated by:  GRAVES-BIGELOW,Dominic Mahaney  Subjective/Objective Assessment:   Pt admitted for numbness around mouth. MRI positive for tiny acute infarct. Pt is from home.     Action/Plan:   No needs identified from CM at this time.   Anticipated DC Date:  04/19/2014   Anticipated DC Plan:  HOME/SELF CARE      DC Planning Services  CM consult      Choice offered to / List presented to:             Status of service:  Completed, signed off Medicare Important Message given?  NA - LOS <3 / Initial given by admissions (If response is "NO", the following Medicare IM given date fields will be blank) Date Medicare IM given:   Medicare IM given by:   Date Additional Medicare IM given:   Additional Medicare IM given by:    Discharge Disposition:  HOME/SELF CARE  Per UR Regulation:  Reviewed for med. necessity/level of care/duration of stay  If discussed at Long Length of Stay Meetings, dates discussed:    Comments:  04-19-14 1612 Pt refusing outpatient PT services at this time. Per pt she can go through her PCP if she needs therapy.

## 2014-04-19 NOTE — Progress Notes (Signed)
STROKE TEAM PROGRESS NOTE   HISTORY Rachael [name] is an 80 y.o. female history of coronary artery disease, hypothyroidism, hyperglycemia, osteoarthritis and gout, who woke up this morning 04/18/2014 with perioral tingling bilaterally. She also noticed tingling in fingers and hands, left greater than right. This was not new onset however. She did not notice any focal motor changes. There were no changes in speech. An MRI of her brain was obtained which showed a small acute posterior left caudate head infarction. Patient has not been on antiplatelet therapy. NIH stroke score was 0. She was LKW 11 PM on 04/17/2014. Patient was not administered TPA secondary to past time window for TPA as well as no objective deficits. She was admitted for further evaluation and treatment.   SUBJECTIVE (INTERVAL HISTORY) Her * family is not at the bedside.  Overall she feels her condition is gradually improving. She has no prior h/o TIA or strokes   OBJECTIVE Temp:  [97.8 F (36.6 C)-98.7 F (37.1 C)] 97.9 F (36.6 C) (11/25 0358) Pulse Rate:  [63-88] 74 (11/25 0800) Cardiac Rhythm:  [-] Normal sinus rhythm (11/25 0800) Resp:  [13-25] 18 (11/25 0358) BP: (96-143)/(52-84) 123/83 mmHg (11/25 1018) SpO2:  [92 %-97 %] 96 % (11/25 0800) Weight:  [75.751 kg (167 lb)-76 kg (167 lb 8.8 oz)] 76 kg (167 lb 8.8 oz) (11/25 0358)   Recent Labs Lab 04/19/14 0749  GLUCAP 93    Recent Labs Lab 04/18/14 1250 04/18/14 1514 04/18/14 2023  NA 143 143 143  K 3.7 3.7 3.8  CL 108 105 105  CO2  --  23 24  GLUCOSE 105* 148* 107*  BUN 16 15 13   CREATININE 0.90 0.86 0.84  CALCIUM  --  9.7 9.2    Recent Labs Lab 04/18/14 1514 04/18/14 2023  AST 18 17  ALT 16 15  ALKPHOS 95 90  BILITOT 0.4 0.4  PROT 7.4 7.2  ALBUMIN 4.1 3.9    Recent Labs Lab 04/18/14 1250 04/18/14 1514 04/18/14 2023  WBC  --  7.6 7.1  NEUTROABS  --  5.3 4.5  HGB 16.3* 14.4 14.4  HCT 48.0* 45.0 44.1  MCV  --  90.5 91.3  PLT  --   282 274   No results for input(s): CKTOTAL, CKMB, CKMBINDEX, TROPONINI in the last 168 hours.  Recent Labs  04/18/14 2023  LABPROT 14.0  INR 1.07    Recent Labs  04/18/14 2055  COLORURINE YELLOW  LABSPEC 1.018  PHURINE 5.5  GLUCOSEU NEGATIVE  HGBUR NEGATIVE  BILIRUBINUR NEGATIVE  KETONESUR NEGATIVE  PROTEINUR NEGATIVE  UROBILINOGEN 0.2  NITRITE NEGATIVE  LEUKOCYTESUR TRACE*       Component Value Date/Time   CHOL 173 04/19/2014 0415   TRIG 129 04/19/2014 0415   HDL 42 04/19/2014 0415   CHOLHDL 4.1 04/19/2014 0415   VLDL 26 04/19/2014 0415   LDLCALC 105* 04/19/2014 0415   Lab Results  Component Value Date   HGBA1C 5.8 01/11/2013      Component Value Date/Time   LABOPIA NONE DETECTED 04/18/2014 2055   COCAINSCRNUR NONE DETECTED 04/18/2014 2055   LABBENZ NONE DETECTED 04/18/2014 2055   AMPHETMU NONE DETECTED 04/18/2014 2055   THCU NONE DETECTED 04/18/2014 2055   LABBARB NONE DETECTED 04/18/2014 2055     Recent Labs Lab 04/18/14 2023  ETH <11    Mr Brain Wo Contrast  04/18/2014   CLINICAL DATA:  80 year old female with tingling perioral region, tongue and finger tips. Initial encounter.  EXAM:  MRI HEAD WITHOUT CONTRAST  TECHNIQUE: Multiplanar, multiecho pulse sequences of the brain and surrounding structures were obtained without intravenous contrast.  COMPARISON:  12/10/2012.  FINDINGS: Questionable tiny acute infarct posterior left caudate head.  No intracranial hemorrhage.  Mild small vessel disease type changes.  Global atrophy without hydrocephalus.  No intracranial mass lesion noted on this unenhanced exam.  Partially empty non expanded sella. Infundibulum deviates slightly to the right. No discrete pituitary mass identified.  Mild cervical spondylotic changes upper cervical spine which transverse ligament hypertrophy. Cervical medullary junction unremarkable.  Major intracranial vascular structures are patent. Atherosclerotic type changes vertebral artery  bilaterally. Ectatic basilar artery.  IMPRESSION: Questionable tiny acute infarct posterior left caudate head.  No intracranial hemorrhage.  Mild small vessel disease type changes.  Global atrophy without hydrocephalus.  No intracranial mass lesion noted on this unenhanced exam.  Mild cervical spondylotic changes upper cervical spine which transverse ligament hypertrophy.  Major intracranial vascular structures are patent. Atherosclerotic type changes vertebral artery bilaterally. Ectatic basilar artery.   Electronically Signed   By: Bridgett LarssonSteve  Olson M.D.   On: 04/18/2014 19:25   Carotid Doppler  Bilateral: 1-39% ICA stenosis. Left ICA is borderline >40%.  PHYSICAL EXAM Frail elderly caucasian lady not in distress.Awake alert. Afebrile. Head is nontraumatic. Neck is supple without bruit. Hearing is normal. Cardiac exam no murmur or gallop. Lungs are clear to auscultation. Distal pulses are well felt. Neurological Exam ;  Awake  Alert oriented x 3. Normal speech and language.eye movements full without nystagmus.fundi were not visualized. Vision acuity and fields appear normal. Hearing is normal. Palatal movements are normal. Face symmetric. Tongue midline. Normal strength, tone, reflexes and coordination. Normal sensation. Gait deferred. ASSESSMENT/PLAN Ms. Rachael [name] is a 80 y.o. female with history of coronary artery disease, hypothyroidism, hyperglycemia, osteoarthritis and gout, who woke with perioral tingling bilaterally. She did not receive IV t-PA  due to delay in arrival, no objective deficits.    TIA:  Possibly brainstem from small vessel disease source  Resultant  No resultant neuro deficits  MRI  ? L caudate head infarct  MRA  Not done. Ct angio ordered  Carotid Doppler  L ICA > 40% (borderline)  2D Echo  pending  HgbA1c pending    Heparin 5000 units sq tid for VTE prophylaxis  Diet Carb Modified thin liquids  no antithrombotic prior to admission, now on aspirin 325 mg  orally every day  Patient counseled to be compliant with her antithrombotic medications  Ongoing aggressive stroke risk factor management  Therapy recommendations:  No therapy needs  Disposition:  Return home  Hyperlipidemia  Home meds:  No statin  LDL 108, goal < 70  Added lipitor 80 mg daily  Continue statin at discharge  Other Stroke Risk Factors  Advanced age  Coronary artery disease  Other Pertinent History  SVT  Gout  GERD  Depression  Hospital day # 1  I have personally examined this patient, reviewed notes, independently viewed imaging studies, participated in medical decision making and plan of care. I have made any additions or clarifications directly to the above note. Agree with note above. I think the patient's symptoms of perioral and left hand tingling are nonspecific though they could represent Cheiro oral paresthesias from tiny brainstem TIA due to small vessel disease this is less likely. The tiny MRI diffusion-positive lesion is very questionable and unrelated  Delia HeadyPramod Lerline Valdivia, MD Medical Director [facility] Pager: [phone] 04/19/2014 1:03 PM  To contact Stroke Continuity provider, please refer to [email or web address] After hours, contact General Neurology

## 2014-04-19 NOTE — Progress Notes (Addendum)
*  PRELIMINARY RESULTS* Vascular Ultrasound Carotid Duplex (Doppler) has been completed.  Preliminary findings: Bilateral:  1-39% ICA stenosis. Left ICA is borderline >40%.   Vertebral artery flow is antegrade.     Farrel DemarkJill Eunice, RDMS, RVT  04/19/2014, 9:55 AM

## 2014-04-21 DIAGNOSIS — I639 Cerebral infarction, unspecified: Secondary | ICD-10-CM | POA: Insufficient documentation

## 2014-04-26 ENCOUNTER — Encounter: Payer: Self-pay | Admitting: Family Medicine

## 2014-04-26 ENCOUNTER — Ambulatory Visit (INDEPENDENT_AMBULATORY_CARE_PROVIDER_SITE_OTHER): Payer: Medicare Other | Admitting: Family Medicine

## 2014-04-26 VITALS — BP 141/83 | HR 79 | Temp 98.5°F | Ht 64.0 in | Wt 166.0 lb

## 2014-04-26 DIAGNOSIS — I6350 Cerebral infarction due to unspecified occlusion or stenosis of unspecified cerebral artery: Secondary | ICD-10-CM | POA: Diagnosis not present

## 2014-04-26 DIAGNOSIS — I639 Cerebral infarction, unspecified: Secondary | ICD-10-CM

## 2014-04-26 DIAGNOSIS — I2510 Atherosclerotic heart disease of native coronary artery without angina pectoris: Secondary | ICD-10-CM

## 2014-04-26 DIAGNOSIS — E785 Hyperlipidemia, unspecified: Secondary | ICD-10-CM | POA: Diagnosis not present

## 2014-04-26 NOTE — Progress Notes (Signed)
   Subjective:    Patient ID: Rachael [name], female    DOB: 08/12/1930, 80 y.o.   MRN: [id number]  HPI Here to follow up a hospital stay from 04-18-14 to 04-19-14 for a small stroke. She presented with perioral numbness and tingling, and a brain MRI revealed a tiny infarct in the left caudate nucleus. A CT angiogram showed no significant stenoses. Her ECHO was normal. Her carotid dopplers showed a 1-39% blockage in both internal carotids. She was started on 325 mg of aspirin daily and 80 mg of Lipitor. It was decided that more aggressive anticoagulation would not be indicated. Her perioral numbness resolved in 24 hours and she has felt fine ever since.    Review of Systems  Constitutional: Negative.   HENT: Negative.   Eyes: Negative.   Respiratory: Negative.   Cardiovascular: Negative.   Neurological: Positive for numbness. Negative for dizziness, tremors, seizures, syncope, facial asymmetry, speech difficulty, weakness, light-headedness and headaches.       Objective:   Physical Exam  Constitutional: She is oriented to person, place, and time. She appears well-developed and well-nourished.  HENT:  Head: Normocephalic and atraumatic.  Eyes: Conjunctivae are normal. Pupils are equal, round, and reactive to light.  Neck: No thyromegaly present.  Cardiovascular: Normal rate, regular rhythm, normal heart sounds and intact distal pulses.   Pulmonary/Chest: Effort normal and breath sounds normal.  Lymphadenopathy:    She has no cervical adenopathy.  Neurological: She is alert and oriented to person, place, and time. No cranial nerve deficit. She exhibits normal muscle tone. Coordination normal.          Assessment & Plan:  Resolved acute stroke. We will continue the current meds regimen. Refer to Neurology.

## 2014-05-05 ENCOUNTER — Ambulatory Visit: Payer: Medicare Other | Admitting: Cardiology

## 2014-05-26 HISTORY — PX: CATARACT EXTRACTION, BILATERAL: SHX1313

## 2014-05-29 ENCOUNTER — Ambulatory Visit: Payer: Medicare Other | Admitting: Physician Assistant

## 2014-06-23 ENCOUNTER — Telehealth: Payer: Self-pay | Admitting: Family Medicine

## 2014-06-23 DIAGNOSIS — H269 Unspecified cataract: Secondary | ICD-10-CM

## 2014-06-23 NOTE — Telephone Encounter (Addendum)
Pt has seen dr Anette Guarnerisharpio in past and now would like a referral to see dr Dagoberto Lig[facility]stoneburner [facility] for [facility] . Pt has [facility] medic[facility] complete. Pt  will fax new ins card. Can we do referral?

## 2014-06-27 NOTE — Telephone Encounter (Signed)
I spoke with pt  

## 2014-06-27 NOTE — Telephone Encounter (Signed)
done

## 2014-07-05 ENCOUNTER — Telehealth: Payer: Self-pay | Admitting: Family Medicine

## 2014-07-05 NOTE — Telephone Encounter (Signed)
Pt called and she is really concerned about her blurred vision. She has appointment on 08/09/14 with Ophthalmology and wants to know if they can see her any sooner? Can you check into this?

## 2014-07-05 NOTE — Telephone Encounter (Signed)
I don't think there is anything we can do about this. She can get on their cancellation list

## 2014-07-05 NOTE — Telephone Encounter (Signed)
I spoke with pt and went over the below message. 

## 2014-07-21 IMAGING — CT CT ABD-PELV W/ CM
2 of 5 series · 16 of 46 positions shown, 18 images · IV contrast (omnipaque)
Comparison: Upper GI 07/04/2010.  No prior abdominal pelvic CTs.
Chest CT of 10/04/2009 is reviewed.

CLINICAL DATA: Left lower quadrant pain.  Nausea vomiting for 1
year.  History diverticulitis.  Ventral hernia repair in 5625.

CT ABDOMEN AND PELVIS WITH CONTRAST
TECHNIQUE: Multidetector CT imaging of the abdomen and pelvis was
performed following the standard protocol during bolus
administration of intravenous contrast.
Contrast: 100mL OMNIPAQUE IOHEXOL 300 MG/ML  SOLN

[Series 2: abd/ pel 5mm · axial · 0.64mm/px · z∈[-450,-20]mm · 13 of 96 slices shown, 15 images]
[im 5/96  soft-tissue]
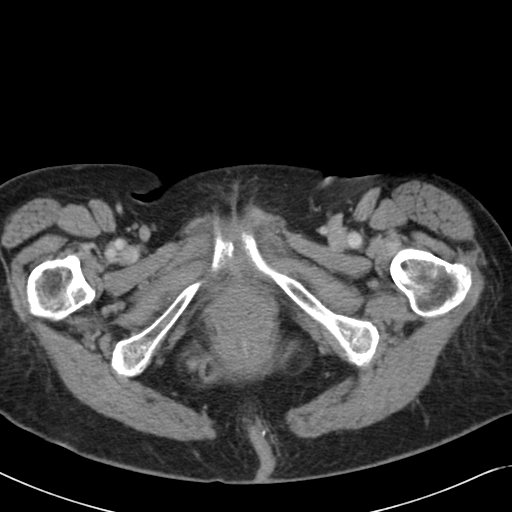
[im 5/96  bone]
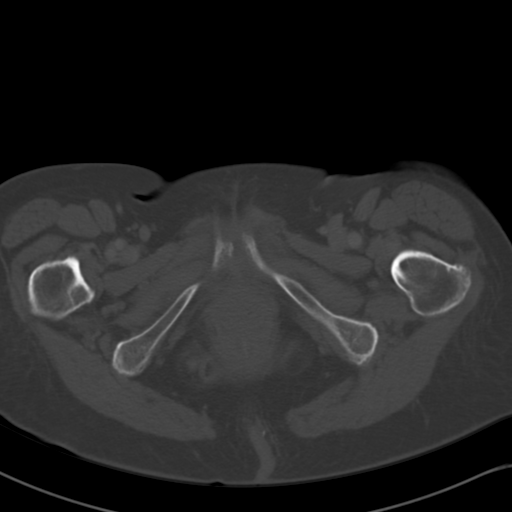
[im 15/96  soft-tissue]
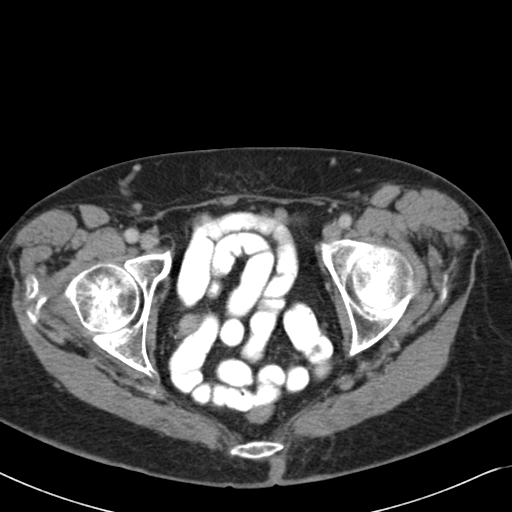
[im 20/96  soft-tissue]
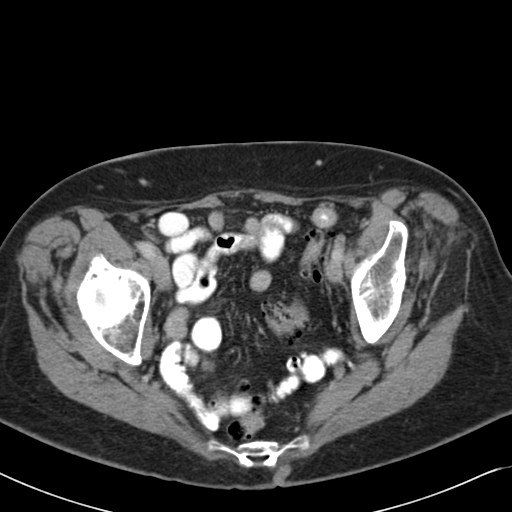
[im 29/96  soft-tissue]
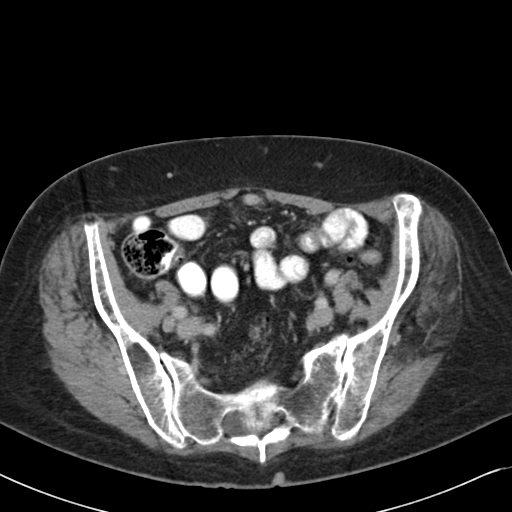
[im 34/96  soft-tissue]
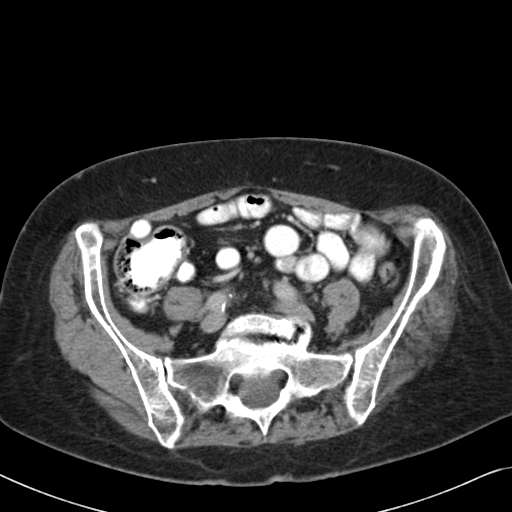
[im 43/96  soft-tissue]
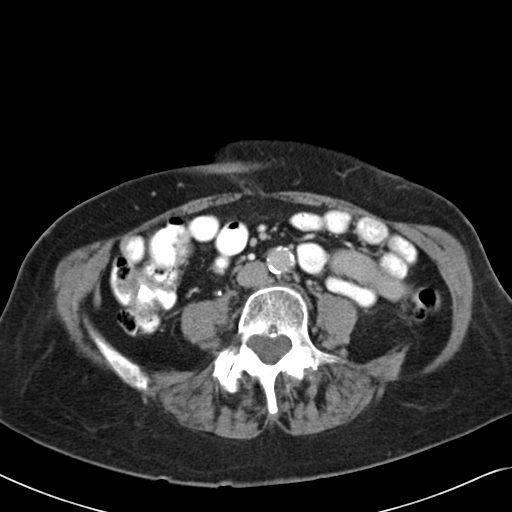
[im 48/96  soft-tissue]
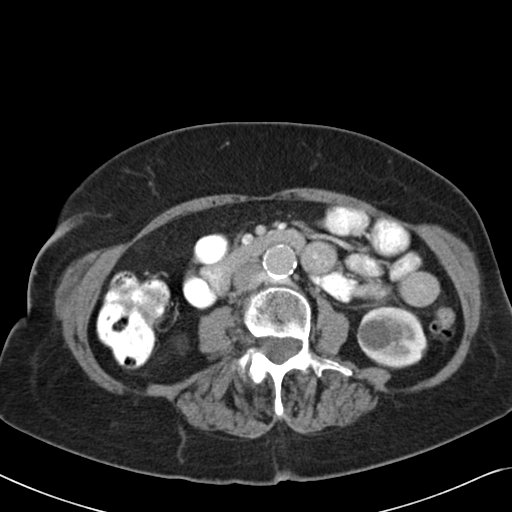
[im 53/96  soft-tissue]
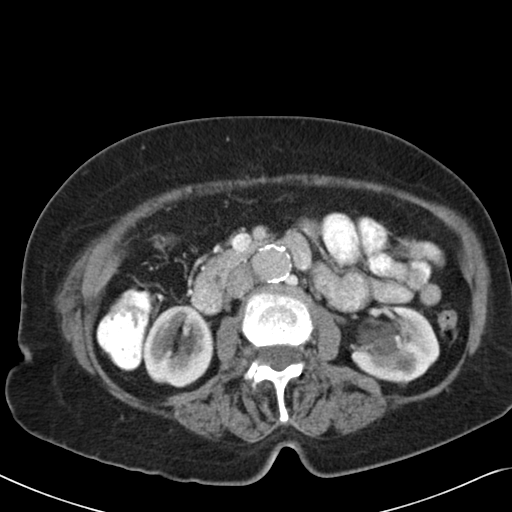
[im 62/96  soft-tissue]
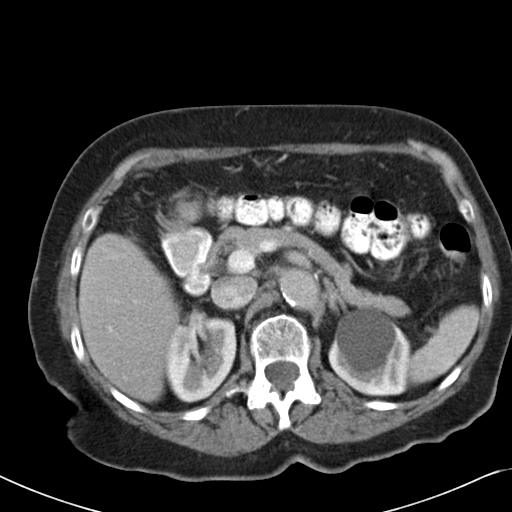
[im 62/96  bone]
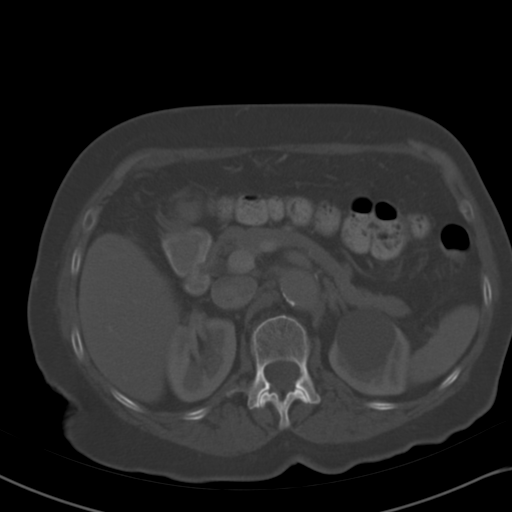
[im 67/96  soft-tissue]
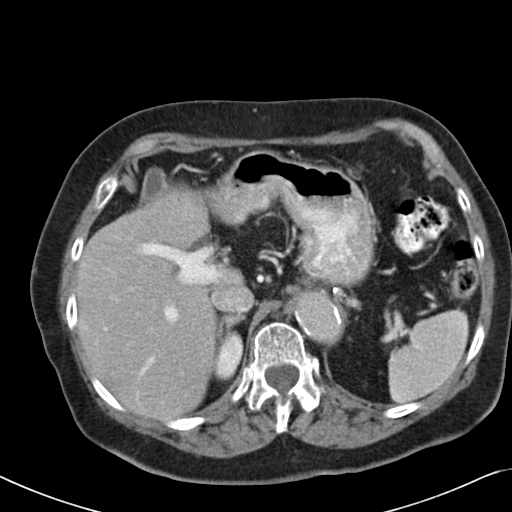
[im 77/96  soft-tissue]
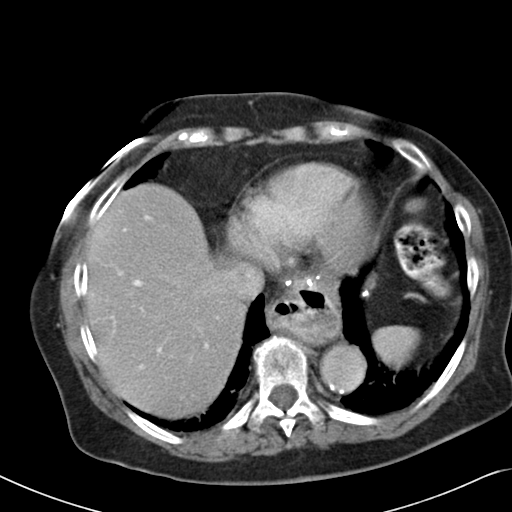
[im 81/96  soft-tissue]
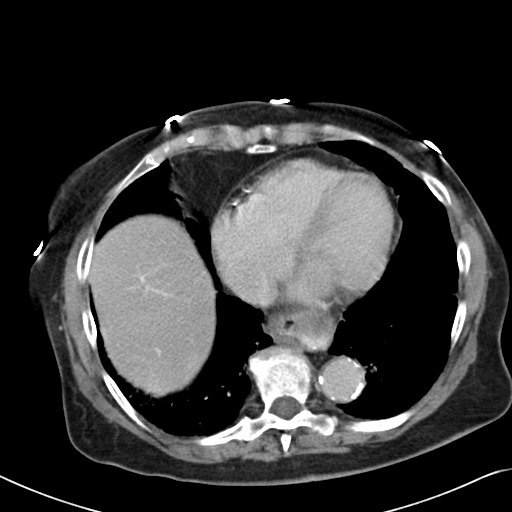
[im 91/96  soft-tissue]
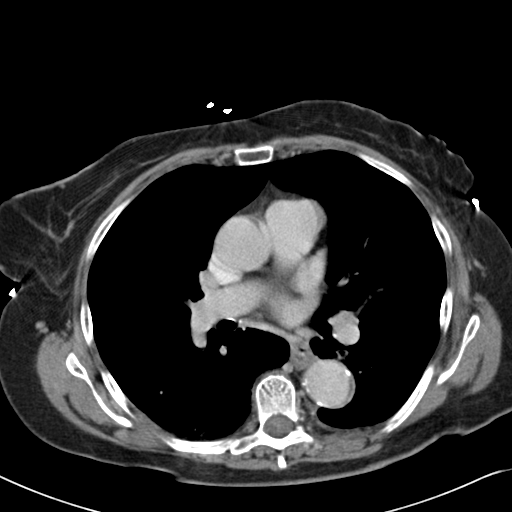

[Series 602: cor · coronal · 0.96mm/px · 3 of 108 slices shown]
[im 36/108  soft-tissue]
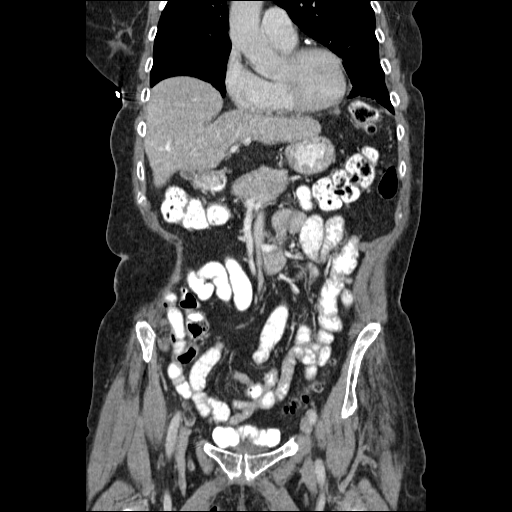
[im 48/108  soft-tissue]
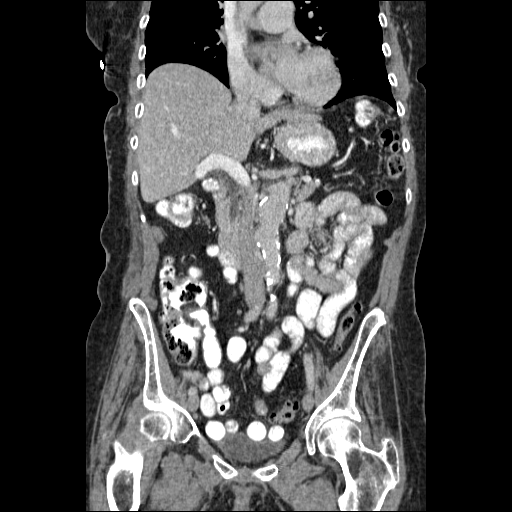
[im 60/108  soft-tissue]
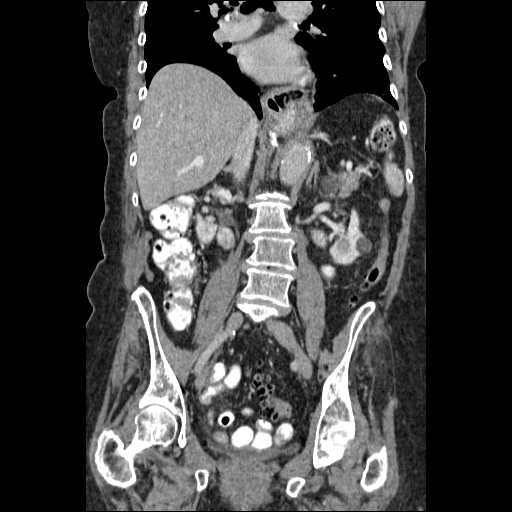

[16 of 46 positions shown; findings below may reference images not displayed]

FINDINGS: Lung bases:  Scarring and volume loss at the lung bases.
Mild cardiomegaly with coronary artery atherosclerosis.  A moderate
hiatal hernia, with surgical changes at the gastroesophageal
junction.  The lower thoracic esophagus is mildly dilated with a
fluid level within. Fat containing Morgagni hernia.

Abdomen/pelvis:  Moderate hepatic steatosis, without focal liver
lesion.  A small splenule.  Normal distal stomach.  Moderate
pancreatic atrophy.  Normal gallbladder, biliary tract, adrenal
glands.  Bilateral renal cysts.  Bilateral renal sinus cysts,
without hydronephrosis. A calyceal  diverticulum in the interpolar
left kidney which measures approximately 1.2 cm.

A circumaortic left renal vein.  Infrarenal abdominal aorta is
nonaneurysmally dilated at 2.4 cm. No retroperitoneal or
retrocrural adenopathy.

Extensive colonic diverticulosis, without evidence of
diverticulitis. Normal terminal ileum.  Normal caliber of small
bowel loops.  No ascites or obstruction.

There is moderate pelvic floor laxity.  Otherwise normal urinary
bladder.  Hysterectomy. No adnexal mass or significant free fluid.

Bones/Musculoskeletal:  Tarlov cysts.  Moderate osteopenia.  Grade
1 L4-L5 anterolisthesis.
IMPRESSION: 1. No acute process or explanation for left lower quadrant pain
2.  Moderate hiatal hernia with surgical changes at the
gastroesophageal junction. Esophageal air fluid level suggests
dysmotility or gastroesophageal reflux.
3.  Mild hepatic steatosis.
4.  Moderate pelvic floor laxity.

## 2014-08-09 DIAGNOSIS — H2513 Age-related nuclear cataract, bilateral: Secondary | ICD-10-CM | POA: Diagnosis not present

## 2014-08-09 DIAGNOSIS — H04123 Dry eye syndrome of bilateral lacrimal glands: Secondary | ICD-10-CM | POA: Diagnosis not present

## 2014-08-09 DIAGNOSIS — H40033 Anatomical narrow angle, bilateral: Secondary | ICD-10-CM | POA: Diagnosis not present

## 2014-08-09 DIAGNOSIS — H16103 Unspecified superficial keratitis, bilateral: Secondary | ICD-10-CM | POA: Diagnosis not present

## 2014-08-10 DIAGNOSIS — H1032 Unspecified acute conjunctivitis, left eye: Secondary | ICD-10-CM | POA: Diagnosis not present

## 2014-08-15 DIAGNOSIS — H4020X0 Unspecified primary angle-closure glaucoma, stage unspecified: Secondary | ICD-10-CM | POA: Diagnosis not present

## 2014-08-15 DIAGNOSIS — H40032 Anatomical narrow angle, left eye: Secondary | ICD-10-CM | POA: Diagnosis not present

## 2014-08-17 DIAGNOSIS — H40031 Anatomical narrow angle, right eye: Secondary | ICD-10-CM | POA: Diagnosis not present

## 2014-08-28 ENCOUNTER — Ambulatory Visit (INDEPENDENT_AMBULATORY_CARE_PROVIDER_SITE_OTHER): Payer: Medicare Other | Admitting: Family Medicine

## 2014-08-28 ENCOUNTER — Encounter: Payer: Self-pay | Admitting: Family Medicine

## 2014-08-28 VITALS — BP 140/81 | HR 90 | Temp 99.0°F | Ht 64.0 in | Wt 170.0 lb

## 2014-08-28 DIAGNOSIS — E039 Hypothyroidism, unspecified: Secondary | ICD-10-CM

## 2014-08-28 DIAGNOSIS — R1084 Generalized abdominal pain: Secondary | ICD-10-CM

## 2014-08-28 DIAGNOSIS — R5382 Chronic fatigue, unspecified: Secondary | ICD-10-CM

## 2014-08-28 NOTE — Progress Notes (Signed)
   Subjective:    Patient ID: Rachael [name], female    DOB: 11/27/1930, 81 y.o.   MRN: [id number]  HPI Here asking about chronic fatigue and about abdominal pain and bloating. She has complained about the abdominal pains for several years and the symptoms do not seem to be changing. She tends to be constipated but sometimes has loose stools. She has been evaluated by GI with endoscopy, CT scans, etc but not has been revealed. She uses Miralax intermittently but not on s regular basis. No nausea. Appetite is good. She also feels tired all the time despite sleeping well. She asks to have some labs checked today.    Review of Systems  Constitutional: Negative.   HENT: Negative.   Respiratory: Negative.   Cardiovascular: Negative.   Gastrointestinal: Positive for abdominal pain, constipation and abdominal distention. Negative for nausea, vomiting, diarrhea, blood in stool, anal bleeding and rectal pain.  Endocrine: Negative.   Genitourinary: Negative.        Objective:   Physical Exam  Constitutional: She appears well-developed and well-nourished.  Neck: No thyromegaly present.  Cardiovascular: Normal rate, regular rhythm, normal heart sounds and intact distal pulses.   Pulmonary/Chest: Effort normal and breath sounds normal.  Abdominal: Soft. Bowel sounds are normal. She exhibits no distension and no mass. There is no tenderness. There is no rebound and no guarding.  Lymphadenopathy:    She has no cervical adenopathy.          Assessment & Plan:  She probably has some IBS so I suggested she use Miralax every day. Try a probiotic such as Align daily. Get labs today.

## 2014-08-28 NOTE — Progress Notes (Signed)
Pre visit review using our clinic review tool, if applicable. No additional management support is needed unless otherwise documented below in the visit note. 

## 2014-08-29 ENCOUNTER — Ambulatory Visit: Payer: Medicare Other | Admitting: Family Medicine

## 2014-08-29 LAB — BASIC METABOLIC PANEL
BUN: 19 mg/dL (ref 6–23)
CALCIUM: 9.6 mg/dL (ref 8.4–10.5)
CO2: 27 mEq/L (ref 19–32)
CREATININE: 1.45 mg/dL — AB (ref 0.40–1.20)
Chloride: 108 mEq/L (ref 96–112)
GFR: 36.73 mL/min — AB (ref >60.00)
GLUCOSE: 98 mg/dL (ref 70–99)
Potassium: 4.5 mEq/L (ref 3.5–5.1)
SODIUM: 144 meq/L (ref 135–145)

## 2014-08-29 LAB — CBC WITH DIFFERENTIAL/PLATELET
BASOS ABS: 0 10*3/uL (ref 0.0–0.1)
Basophils Relative: 0.5 % (ref 0.0–3.0)
Eosinophils Absolute: 0.2 10*3/uL (ref 0.0–0.7)
Eosinophils Relative: 2.8 % (ref 0.0–5.0)
HCT: 43.1 % (ref 36.0–46.0)
HEMOGLOBIN: 14.2 g/dL (ref 12.0–15.0)
LYMPHS ABS: 2.6 10*3/uL (ref 0.7–4.0)
LYMPHS PCT: 30.1 % (ref 12.0–46.0)
MCHC: 33 g/dL (ref 30.0–36.0)
MCV: 85.9 fl (ref 78.0–100.0)
MONOS PCT: 7.3 % (ref 3.0–12.0)
Monocytes Absolute: 0.6 10*3/uL (ref 0.1–1.0)
Neutro Abs: 5.2 10*3/uL (ref 1.4–7.7)
Neutrophils Relative %: 59.3 % (ref 43.0–77.0)
PLATELETS: 312 10*3/uL (ref 150.0–400.0)
RBC: 5.01 Mil/uL (ref 3.87–5.11)
RDW: 14.7 % (ref 11.5–15.5)
WBC: 8.7 10*3/uL (ref 4.0–10.5)

## 2014-08-29 LAB — HEPATITIS C ANTIBODY: HCV Ab: NEGATIVE

## 2014-08-29 LAB — HEPATIC FUNCTION PANEL
ALT: 18 U/L (ref 0–35)
AST: 21 U/L (ref 0–37)
Albumin: 4.2 g/dL (ref 3.5–5.2)
Alkaline Phosphatase: 110 U/L (ref 39–117)
BILIRUBIN DIRECT: 0.1 mg/dL (ref 0.0–0.3)
Total Bilirubin: 0.3 mg/dL (ref 0.2–1.2)
Total Protein: 7.2 g/dL (ref 6.0–8.3)

## 2014-08-29 LAB — TSH: TSH: 2.87 u[IU]/mL (ref 0.35–4.50)

## 2014-08-29 LAB — HEPATITIS B SURFACE ANTIGEN: HEP B S AG: NEGATIVE

## 2014-09-18 DIAGNOSIS — H6122 Impacted cerumen, left ear: Secondary | ICD-10-CM | POA: Diagnosis not present

## 2014-11-17 DIAGNOSIS — H4720 Unspecified optic atrophy: Secondary | ICD-10-CM | POA: Diagnosis not present

## 2014-11-17 DIAGNOSIS — H524 Presbyopia: Secondary | ICD-10-CM | POA: Diagnosis not present

## 2014-11-17 DIAGNOSIS — H5213 Myopia, bilateral: Secondary | ICD-10-CM | POA: Diagnosis not present

## 2014-11-17 DIAGNOSIS — H25813 Combined forms of age-related cataract, bilateral: Secondary | ICD-10-CM | POA: Diagnosis not present

## 2014-12-05 DIAGNOSIS — H2512 Age-related nuclear cataract, left eye: Secondary | ICD-10-CM | POA: Diagnosis not present

## 2014-12-05 DIAGNOSIS — H25812 Combined forms of age-related cataract, left eye: Secondary | ICD-10-CM | POA: Diagnosis not present

## 2014-12-05 DIAGNOSIS — H25042 Posterior subcapsular polar age-related cataract, left eye: Secondary | ICD-10-CM | POA: Diagnosis not present

## 2014-12-05 DIAGNOSIS — H21562 Pupillary abnormality, left eye: Secondary | ICD-10-CM | POA: Diagnosis not present

## 2014-12-05 DIAGNOSIS — H25012 Cortical age-related cataract, left eye: Secondary | ICD-10-CM | POA: Diagnosis not present

## 2014-12-19 DIAGNOSIS — H2511 Age-related nuclear cataract, right eye: Secondary | ICD-10-CM | POA: Diagnosis not present

## 2014-12-19 DIAGNOSIS — H21561 Pupillary abnormality, right eye: Secondary | ICD-10-CM | POA: Diagnosis not present

## 2014-12-19 DIAGNOSIS — H25011 Cortical age-related cataract, right eye: Secondary | ICD-10-CM | POA: Diagnosis not present

## 2014-12-19 DIAGNOSIS — H25042 Posterior subcapsular polar age-related cataract, left eye: Secondary | ICD-10-CM | POA: Diagnosis not present

## 2014-12-19 DIAGNOSIS — H25811 Combined forms of age-related cataract, right eye: Secondary | ICD-10-CM | POA: Diagnosis not present

## 2014-12-25 ENCOUNTER — Other Ambulatory Visit: Payer: Self-pay | Admitting: Family Medicine

## 2014-12-26 ENCOUNTER — Other Ambulatory Visit: Payer: Self-pay | Admitting: *Deleted

## 2014-12-26 MED ORDER — RANITIDINE HCL 150 MG PO TABS: 150.0000 mg | ORAL_TABLET | Freq: Every day | ORAL | Status: DC

## 2015-01-24 ENCOUNTER — Other Ambulatory Visit: Payer: Self-pay | Admitting: Family Medicine

## 2015-01-30 DIAGNOSIS — Z961 Presence of intraocular lens: Secondary | ICD-10-CM | POA: Diagnosis not present

## 2015-01-31 ENCOUNTER — Ambulatory Visit (INDEPENDENT_AMBULATORY_CARE_PROVIDER_SITE_OTHER): Payer: Medicare Other | Admitting: Family Medicine

## 2015-01-31 ENCOUNTER — Encounter: Payer: Self-pay | Admitting: Family Medicine

## 2015-01-31 VITALS — BP 123/82 | HR 70 | Temp 98.4°F | Ht 64.0 in | Wt 162.0 lb

## 2015-01-31 DIAGNOSIS — Z23 Encounter for immunization: Secondary | ICD-10-CM | POA: Diagnosis not present

## 2015-01-31 DIAGNOSIS — K219 Gastro-esophageal reflux disease without esophagitis: Secondary | ICD-10-CM | POA: Diagnosis not present

## 2015-01-31 DIAGNOSIS — R1013 Epigastric pain: Secondary | ICD-10-CM

## 2015-01-31 DIAGNOSIS — G8929 Other chronic pain: Secondary | ICD-10-CM

## 2015-01-31 DIAGNOSIS — I2510 Atherosclerotic heart disease of native coronary artery without angina pectoris: Secondary | ICD-10-CM | POA: Diagnosis not present

## 2015-01-31 DIAGNOSIS — R0789 Other chest pain: Secondary | ICD-10-CM | POA: Diagnosis not present

## 2015-01-31 LAB — BASIC METABOLIC PANEL
BUN: 13 mg/dL (ref 6–23)
CO2: 28 mEq/L (ref 19–32)
Calcium: 9.6 mg/dL (ref 8.4–10.5)
Chloride: 106 mEq/L (ref 96–112)
Creatinine, Ser: 0.96 mg/dL (ref 0.40–1.20)
GFR: 59.05 mL/min — AB (ref >60.00)
Glucose, Bld: 97 mg/dL (ref 70–99)
POTASSIUM: 4.8 meq/L (ref 3.5–5.1)
SODIUM: 141 meq/L (ref 135–145)

## 2015-01-31 MED ORDER — LEVOTHYROXINE SODIUM 100 MCG PO TABS: 100.0000 ug | ORAL_TABLET | Freq: Every day | ORAL | Status: DC

## 2015-01-31 MED ORDER — NEOMYCIN-POLYMYXIN-DEXAMETH 0.1 % OP SUSP: 1.0000 [drp] | Freq: Four times a day (QID) | OPHTHALMIC | Status: DC

## 2015-01-31 MED ORDER — ALLOPURINOL 100 MG PO TABS
100.0000 mg | ORAL_TABLET | Freq: Every day | ORAL | Status: DC
Start: 2015-01-31 — End: 2016-01-26

## 2015-01-31 MED ORDER — RANITIDINE HCL 150 MG PO TABS: 150.0000 mg | ORAL_TABLET | Freq: Every day | ORAL | Status: DC

## 2015-01-31 NOTE — Progress Notes (Signed)
Pre visit review using our clinic review tool, if applicable. No additional management support is needed unless otherwise documented below in the visit note. 

## 2015-01-31 NOTE — Progress Notes (Signed)
   Subjective:    Patient ID: Rachael [name], female    DOB: [birth year], 81 y.o.   MRN: [id number]  HPI Here to follow up on GERD and chest discomfort. She takes Zantac daily but she still complains of a vague pressure sensation in the chest. She feels SOB at times but there is no coughing. She had upper endoscopy last year per Dr. Ezzard Standing and this was unremarkable. No trouble swallowing.    Review of Systems  Constitutional: Negative.   Respiratory: Positive for chest tightness and shortness of breath. Negative for cough, choking and wheezing.   Cardiovascular: Negative.   Gastrointestinal: Negative.   Genitourinary: Negative.        Objective:   Physical Exam  Constitutional: She appears well-developed and well-nourished. No distress.  Neck: No thyromegaly present.  Cardiovascular: Normal rate, regular rhythm, normal heart sounds and intact distal pulses.   Pulmonary/Chest: Effort normal and breath sounds normal. No respiratory distress. She has no wheezes. She has no rales. She exhibits no tenderness.  Abdominal: Soft. Bowel sounds are normal. She exhibits no distension and no mass. There is no tenderness. There is no rebound and no guarding.  Lymphadenopathy:    She has no cervical adenopathy.          Assessment & Plan:  She has ill defined chest tightness which is probably related to her hiatal hernia. We will set up a chest CT scan to evaluate.

## 2015-02-07 ENCOUNTER — Ambulatory Visit (INDEPENDENT_AMBULATORY_CARE_PROVIDER_SITE_OTHER)
Admission: RE | Admit: 2015-02-07 | Discharge: 2015-02-07 | Disposition: A | Discharge status: Home / Self Care | Payer: Medicare Other | Source: Ambulatory Visit | Attending: Family Medicine | Admitting: Family Medicine

## 2015-02-07 DIAGNOSIS — R0789 Other chest pain: Secondary | ICD-10-CM | POA: Diagnosis not present

## 2015-02-07 DIAGNOSIS — R079 Chest pain, unspecified: Secondary | ICD-10-CM | POA: Diagnosis not present

## 2015-02-07 MED ORDER — IOHEXOL 300 MG/ML  SOLN
80.0000 mL | Freq: Once | INTRAMUSCULAR | Status: AC | PRN
  Administered 2015-02-07: 80 mL via INTRAVENOUS

## 2015-02-12 NOTE — Addendum Note (Signed)
Addended by: Gershon Crane A on: 02/12/2015 10:51 PM   Modules accepted: Orders

## 2015-02-13 ENCOUNTER — Telehealth: Payer: Self-pay | Admitting: Cardiology

## 2015-02-13 NOTE — Telephone Encounter (Signed)
Patient would like to schedule her stress test that Dr. Antoine Poche always wants her to have prior to her visit with him.  She has not seen Dr. Antoine Poche in a while and thought if we could do the stress test prior to her visit, that would save  time.

## 2015-02-13 NOTE — Telephone Encounter (Signed)
Patient would like to schedule her stress test and carotid ultrasound that Dr. Antoine Poche always wants her to have prior to her visit with him. She has not seen Dr. Antoine Poche in a while and thought if we could do the stress test prior to her visit, that would save time.  She had not made an OV yet and will need one made.

## 2015-02-14 NOTE — Telephone Encounter (Signed)
No thank you

## 2015-02-15 NOTE — Telephone Encounter (Signed)
Let patient know that Dr. Antoine Poche would like to see her in the office before he orders any testing

## 2015-03-06 DIAGNOSIS — M170 Bilateral primary osteoarthritis of knee: Secondary | ICD-10-CM | POA: Diagnosis not present

## 2015-03-13 DIAGNOSIS — M1712 Unilateral primary osteoarthritis, left knee: Secondary | ICD-10-CM | POA: Diagnosis not present

## 2015-03-20 DIAGNOSIS — M1712 Unilateral primary osteoarthritis, left knee: Secondary | ICD-10-CM | POA: Diagnosis not present

## 2015-03-21 ENCOUNTER — Ambulatory Visit (INDEPENDENT_AMBULATORY_CARE_PROVIDER_SITE_OTHER): Payer: Medicare Other

## 2015-03-21 ENCOUNTER — Encounter: Payer: Self-pay | Admitting: Cardiology

## 2015-03-21 ENCOUNTER — Ambulatory Visit (INDEPENDENT_AMBULATORY_CARE_PROVIDER_SITE_OTHER): Payer: Medicare Other | Admitting: Cardiology

## 2015-03-21 VITALS — BP 136/80 | HR 74 | Ht 64.0 in | Wt 159.4 lb

## 2015-03-21 DIAGNOSIS — E785 Hyperlipidemia, unspecified: Secondary | ICD-10-CM

## 2015-03-21 DIAGNOSIS — R079 Chest pain, unspecified: Secondary | ICD-10-CM

## 2015-03-21 DIAGNOSIS — R002 Palpitations: Secondary | ICD-10-CM | POA: Diagnosis not present

## 2015-03-21 NOTE — Progress Notes (Signed)
HPI The patient presents for followup of palpitations, chest pain, TIA and CAD.   The patient was in the hospital last fall with apparent TIA. She had mild carotid plaque. There were no dysrhythmias noted. She's not been seen back here in a couple of years. She returns for follow-up. She did have a stress perfusion study in 2013 was negative. She had a cardiac catheterization with some moderate nonobstructive disease back in 2002. She did have some visual disturbances with a TIA when she was in-hospital fall. I did review all of these records. There was an echocardiogram which was essentially unremarkable except for some mild aortic agency. Carotid Dopplers demonstrated 1-39% bilateral stenosis. She does continue to get occasional palpitations but no presyncope or syncope. She feels what she thinks are panic attacks with her heart beating fast. These are sporadic. She has had take 1 nitroglycerin when this happened in the past several weeks. She will sporadically gets chest discomfort as well. This is mild or moderate. It doesn't last very long. She doesn't describe associated symptoms. She's not particularly active but she can't bring them with activities such as grocery shopping.  Allergies  Allergen Reactions  . Demerol [Meperidine]   . Meperidine Hcl Other (See Comments)    Unknown   . Sulfonamide Derivatives Other (See Comments)    Happened about 15 years ago    Current Outpatient Prescriptions  Medication Sig Dispense Refill  . allopurinol (ZYLOPRIM) 100 MG tablet Take 1 tablet (100 mg total) by mouth daily. 90 tablet 3  . diclofenac (VOLTAREN) 75 MG EC tablet TAKE ONE TABLET BY MOUTH TWICE DAILY 180 tablet 0  . HYDROcodone-acetaminophen (NORCO/VICODIN) 5-325 MG per tablet Take 1 tablet by mouth every 6 (six) hours as needed for moderate pain. 120 tablet 0  . levothyroxine (SYNTHROID, LEVOTHROID) 100 MCG tablet Take 1 tablet (100 mcg total) by mouth daily before breakfast. 90 tablet 3  .  neomycin-polymyxin-dexamethasone (MAXITROL) 0.1 % ophthalmic suspension Place 1 drop into both eyes 4 (four) times daily. 5 mL 5  . nitroGLYCERIN (NITROSTAT) 0.3 MG SL tablet Place 1 tablet (0.3 mg total) under the tongue every 5 (five) minutes as needed for chest pain. 25 tablet 3  . ranitidine (ZANTAC) 150 MG tablet Take 1 tablet (150 mg total) by mouth at bedtime. 90 tablet 3   No current facility-administered medications for this visit.    Past Medical History  Diagnosis Date  . GERD (gastroesophageal reflux disease)   . Gout     has seen Dr. Alben DeedsJames Beekman  . CAD (coronary artery disease)     sees Dr. Antoine PocheHochrein, normal Myoview stress test 02-26-12   . Hypothyroidism   . Hyperglycemia   . Depression   . Anemia   . Diverticulitis of colon   . Arthritis   . Right knee DJD   . H/O hiatal hernia   . Supraventricular tachycardia 04/07/12    In Dr Sherene SiresWainer's office - wore heart monitor for 3 weeks   . Shortness of breath     with anxiety attacks  . Anxiety   . Lightheadedness   . Osteoarthritis   . Stroke     Past Surgical History  Procedure Laterality Date  . Hiatal hernia repair  09-21-10    per Dr. Luretha MurphyMatthew Martin, lap [employer]   . Hernia repair    . Abdominal hysterectomy    . Total knee arthroplasty  06/07/2012    Procedure: TOTAL KNEE ARTHROPLASTY;  Surgeon: Nilda Simmerobert A Wainer,  MD;  Location: [facility] OR;  Service: Orthopedics;  Laterality: Right;  . Partial colectomy      for adhesions  . Colonoscopy  10-10-05    per Dr. Juanda Chance, repeat in 10 yrs   . Colon surgery    . Esophagogastroduodenoscopy N/A 08/29/2013    Procedure: ESOPHAGOGASTRODUODENOSCOPY (EGD);  Surgeon: Kandis Cocking, MD;  Location: [facility];  Service: General;  Laterality: N/A;  . Breath tek h pylori N/A 11/28/2013    Procedure: BREATH TEK H PYLORI;  Surgeon: Valarie Merino, MD;  Location: [facility];  Service: General;  Laterality: N/A;  . Cataract extraction, bilateral  2016    per Dr. Dagoberto Ligas     ROS:   Weight loss with loss of appetite. Otherwise as stated in the HPI and negative for all other systems.  PHYSICAL EXAM BP 136/80 mmHg  Pulse 74  Ht  (1.626 m)  Wt 159 lb 6.4 oz (72.303 kg)  BMI 27.35 kg/m2 GENERAL:  Well appearing HEENT:  Pupils equal round and reactive, fundi not visualized, oral mucosa unremarkable NECK:  No jugular venous distention, waveform within normal limits, carotid upstroke brisk and symmetric, no bruits, no thyromegaly LYMPHATICS:  No cervical, inguinal adenopathy LUNGS:  Clear to auscultation bilaterally BACK:  No CVA tenderness CHEST:  Unremarkable HEART:  PMI not displaced or sustained,S1 and S2 within normal limits, no S3, no S4, no clicks, no rubs, no murmurs ABD:  Flat, positive bowel sounds normal in frequency in pitch, no bruits, no rebound, no guarding, no midline pulsatile mass, no hepatomegaly, no splenomegaly EXT:  2 plus pulses throughout, no edema, no cyanosis no clubbing, right knee swelling  EKG:  Sinus rhythm, rate 69, axis within normal limits, low voltage, nonspecific diffuse T wave flattening.  03/21/2015   ASSESSMENT AND PLAN  TIA:  Rachael [name] will need a 21 day event monitor.  The patients symptoms necessitate an event monitor.  The symptoms are too infrequent to be identified on a Holter monitor.    She was supposed to be taking aspirin and she's not doing this. I will start that.  CORONARY ARTERY DISEASE  It has been 3 years since her last stress test and she is having some discomfort. She's had previous moderate coronary disease medically managed. Stress testing. She would be a walk on a treadmill so she will have a [employer].   CAROTID ARTERY DISEASE  She had mild nonobstructive plaque to her recent workup.  She can have this followed again 2017  DYSLIPIDEMIA:   She has  Not wantedto take a statin  Though this was prescribed in the past.  I will check a lipid profile.

## 2015-03-21 NOTE — Patient Instructions (Addendum)
Your physician wants you to follow-up in: 1 Year. You will receive a reminder letter in the mail two months in advance. If you don't receive a letter, please call our office to schedule the follow-up appointment.  Your physician has requested that you have a lexiscan myoview. For further information please visit [email or web address]. Please follow instruction sheet, as given.  Your physician recommends that you return for lab work in: Fasting Lipids Profile  Your physician has recommended that you wear an event monitor. Event monitors are medical devices that record the heart's electrical activity. Doctors most often us these monitors to diagnose arrhythmias. Arrhythmias are problems with the speed or rhythm of the heartbeat. The monitor is a small, portable device. You can wear one while you do your normal daily activities. This is usually used to diagnose what is causing palpitations/syncope (passing out).  Your physician has recommended you make the following change in your medication: START Aspirin

## 2015-03-21 NOTE — Addendum Note (Signed)
Addended by: Chana BodeGREEN, Delorean Knutzen L on: 03/21/2015 11:52 AM   Modules accepted: Orders

## 2015-03-27 ENCOUNTER — Telehealth (HOSPITAL_COMMUNITY): Payer: Self-pay

## 2015-03-27 NOTE — Telephone Encounter (Signed)
Encounter complete. 

## 2015-03-27 NOTE — Addendum Note (Signed)
Addended by: Lindell SparELKINS, JENNA M on: 03/27/2015 05:31 PM   Modules accepted: Orders

## 2015-03-28 ENCOUNTER — Telehealth (HOSPITAL_COMMUNITY): Payer: Self-pay

## 2015-03-28 NOTE — Telephone Encounter (Signed)
Encounter complete. 

## 2015-03-29 ENCOUNTER — Ambulatory Visit (HOSPITAL_COMMUNITY)
Admission: RE | Admit: 2015-03-29 | Discharge: 2015-03-29 | Disposition: A | Discharge status: Home / Self Care | Payer: Medicare Other | Source: Ambulatory Visit | Attending: Cardiovascular Disease | Admitting: Cardiovascular Disease

## 2015-03-29 ENCOUNTER — Inpatient Hospital Stay (HOSPITAL_COMMUNITY): Admission: RE | Admit: 2015-03-29 | Payer: Medicare Other | Source: Ambulatory Visit

## 2015-03-29 DIAGNOSIS — Z8673 Personal history of transient ischemic attack (TIA), and cerebral infarction without residual deficits: Secondary | ICD-10-CM | POA: Diagnosis not present

## 2015-03-29 DIAGNOSIS — R42 Dizziness and giddiness: Secondary | ICD-10-CM | POA: Insufficient documentation

## 2015-03-29 DIAGNOSIS — I779 Disorder of arteries and arterioles, unspecified: Secondary | ICD-10-CM | POA: Diagnosis not present

## 2015-03-29 DIAGNOSIS — R002 Palpitations: Secondary | ICD-10-CM | POA: Insufficient documentation

## 2015-03-29 DIAGNOSIS — R079 Chest pain, unspecified: Secondary | ICD-10-CM

## 2015-03-29 DIAGNOSIS — R5383 Other fatigue: Secondary | ICD-10-CM | POA: Insufficient documentation

## 2015-03-29 LAB — MYOCARDIAL PERFUSION IMAGING
CHL CUP NUCLEAR SDS: 0
CHL CUP NUCLEAR SRS: 4
CHL CUP NUCLEAR SSS: 4
LV sys vol: 26 mL
LVDIAVOL: 62 mL
Peak HR: 103 {beats}/min
Rest HR: 68 {beats}/min
TID: 1.1

## 2015-03-29 MED ORDER — TECHNETIUM TC 99M SESTAMIBI GENERIC - CARDIOLITE
32.9000 | Freq: Once | INTRAVENOUS | Status: AC | PRN
  Administered 2015-03-29: 32.9 via INTRAVENOUS

## 2015-03-29 MED ORDER — TECHNETIUM TC 99M SESTAMIBI GENERIC - CARDIOLITE
10.7000 | Freq: Once | INTRAVENOUS | Status: AC | PRN
  Administered 2015-03-29: 10.7 via INTRAVENOUS

## 2015-03-29 MED ORDER — REGADENOSON 0.4 MG/5ML IV SOLN
0.4000 mg | Freq: Once | INTRAVENOUS | Status: AC
  Administered 2015-03-29: 0.4 mg via INTRAVENOUS

## 2015-03-29 MED ORDER — AMINOPHYLLINE 25 MG/ML IV SOLN
75.0000 mg | Freq: Once | INTRAVENOUS | Status: AC
  Administered 2015-03-29: 75 mg via INTRAVENOUS

## 2015-04-06 ENCOUNTER — Telehealth: Payer: Self-pay | Admitting: Cardiology

## 2015-04-06 ENCOUNTER — Telehealth: Payer: Self-pay | Admitting: Physician Assistant

## 2015-04-06 NOTE — Telephone Encounter (Signed)
Again paged by Cardionet that patient constantly in SVT at baseline rate of 190s. At reported reading @7 :20pm.  The patient states that she has calm down. The patient denies nausea, vomiting, fever, chest pain, palpitations, shortness of breath, orthopnea, PND, dizziness, syncope, cough, congestion, abdominal pain, hematochezia, melena, lower extremity edema. She is planning to go to [employer]son[place] house tonight.   I have again advised her to go to ER for evaluation. She will think about this and will make further decision. Currently she does not willing to go to ER.  Raylie Maddison, PAC

## 2015-04-06 NOTE — Telephone Encounter (Signed)
Paged by Cardio net regarding patient rhythm. The patient again had a SVT episode today 04/06/2015 @ 4:51pm for 1 minute and 15 second at rate of 209.  Another episode of SVT @ 5:52pm for 2 minutes at rate of 204.  The patient said that she is feeling well. Advised patient to call EMS or go to emergency room ASAP for further evaluation. The patient declines to do either. Spoken with grandson as well. Emphasized importance of going to ER. The patient said "this could be anxiety attack and does not want to pay copay'. Educated that patient having SVT. The patient said "my grandson will stay with me overnight" and she hang up the phone.   Gabrielle Wakeland, PAC

## 2015-04-06 NOTE — Telephone Encounter (Signed)
Spoke with patient regarding urgent cardionet monitor results. She states around the time SVT started she had been in a store and the lady was not letting her use her 25% off coupon and it was making her frustrated. She expressed she was also experiencing anxiety. When I spoke with patient, she was home - she drove herself - and she was resting calmly although she reports she felt her heart beating fasting.   Urgent cardionet monitor showed to Dr. Antoine PocheHochrein who asked what current rhythm was.   Called Cardionet and as of 4:45pm - HR was 202bpm and rhythm was SVT  Called patient and advised [facility] and that she call EMS to come get her. She states she would like to rest,she does not want to pay $250 for EMS transport, that she has been driving herself around all day, and that she will call her grandson to take her. Advised again the severity of this rhythm and that she needs to seek ED evaluation today and best means of transport to hospital is EMS.   Called charge RN at [facility] to make them aware of patient's possible arrival.

## 2015-04-06 NOTE — Telephone Encounter (Signed)
Cardionet  Calling about urgent EKG

## 2015-04-06 NOTE — Telephone Encounter (Signed)
Received urgent Cardionet - high HR SVT - max HR 208bpm - time 4:12pm autotrigger & symptoms reported - lightheadedness/skipped beats.   Awaiting fax

## 2015-04-09 ENCOUNTER — Telehealth: Payer: Self-pay | Admitting: Cardiology

## 2015-04-09 NOTE — Telephone Encounter (Signed)
LM for patient to call 

## 2015-04-09 NOTE — Telephone Encounter (Signed)
Per Answering Service:[place].

## 2015-04-10 ENCOUNTER — Telehealth: Payer: Self-pay | Admitting: Cardiology

## 2015-04-10 NOTE — Telephone Encounter (Signed)
Close encounter ° °*ERROR* °

## 2015-04-27 ENCOUNTER — Ambulatory Visit: Payer: Medicare Other | Admitting: Cardiology

## 2015-05-03 ENCOUNTER — Encounter: Payer: Self-pay | Admitting: Cardiology

## 2015-05-03 ENCOUNTER — Ambulatory Visit (INDEPENDENT_AMBULATORY_CARE_PROVIDER_SITE_OTHER): Payer: Medicare Other | Admitting: Cardiology

## 2015-05-03 VITALS — BP 120/68 | HR 74 | Ht 65.0 in | Wt 156.5 lb

## 2015-05-03 DIAGNOSIS — I471 Supraventricular tachycardia: Secondary | ICD-10-CM

## 2015-05-03 MED ORDER — METOPROLOL SUCCINATE ER 50 MG PO TB24: 50.0000 mg | ORAL_TABLET | Freq: Every day | ORAL | Status: DC

## 2015-05-03 NOTE — Progress Notes (Signed)
HPI The patient presents for followup of palpitations, chest pain, TIA and CAD.   The patient was in the hospital last fall with apparent TIA. She did have some visual disturbances with a TIA when she was in-hospital fall. She had mild carotid plaque. There were no dysrhythmias noted. She did have a stress perfusion study in 2013 was negative. She had a cardiac catheterization with some moderate nonobstructive disease back in 2002.  When I saw her back recently she was describing her palpitations MI apply an event monitor which demonstrated supraventricular tachycardia with rates in the 140s. These were sustained. Multiple attempts at trying to get the patient to present for further treatment and in particular to go to the emergency room failed. We called her and explained the potential severity. She refused to go further management.  Patient presents for follow-up this. She thought she was having panic attacks. She hasn't had a severe episode since then. She otherwise feels well. She's not had any presyncope or syncope. She's not had any chest pressure, neck or arm discomfort. She's had no weight gain or edema. She remains active.   Allergies  Allergen Reactions  . Demerol [Meperidine]   . Meperidine Hcl Other (See Comments)    Unknown   . Sulfonamide Derivatives Other (See Comments)    Happened about 15 years ago    Current Outpatient Prescriptions  Medication Sig Dispense Refill  . allopurinol (ZYLOPRIM) 100 MG tablet Take 1 tablet (100 mg total) by mouth daily. 90 tablet 3  . aspirin 81 MG tablet Take 81 mg by mouth daily.    . diclofenac (VOLTAREN) 75 MG EC tablet TAKE ONE TABLET BY MOUTH TWICE DAILY 180 tablet 0  . HYDROcodone-acetaminophen (NORCO/VICODIN) 5-325 MG per tablet Take 1 tablet by mouth every 6 (six) hours as needed for moderate pain. 120 tablet 0  . levothyroxine (SYNTHROID, LEVOTHROID) 100 MCG tablet Take 1 tablet (100 mcg total) by mouth daily before breakfast. 90 tablet 3   . nitroGLYCERIN (NITROSTAT) 0.3 MG SL tablet Place 1 tablet (0.3 mg total) under the tongue every 5 (five) minutes as needed for chest pain. 25 tablet 3  . ranitidine (ZANTAC) 150 MG tablet Take 1 tablet (150 mg total) by mouth at bedtime. 90 tablet 3   No current facility-administered medications for this visit.    Past Medical History  Diagnosis Date  . GERD (gastroesophageal reflux disease)   . Gout     has seen Dr. Alben Deeds  . CAD (coronary artery disease)     sees Dr. Antoine Poche, normal Myoview stress test 02-26-12   . Hypothyroidism   . Hyperglycemia   . Depression   . Anemia   . Diverticulitis of colon   . Arthritis   . Right knee DJD   . H/O hiatal hernia   . Supraventricular tachycardia (HCC) 04/07/12    In Dr Sherene Sires office - wore heart monitor for 3 weeks   . Shortness of breath     with anxiety attacks  . Anxiety   . Lightheadedness   . Osteoarthritis   . TIA (transient ischemic attack)     11/15    Past Surgical History  Procedure Laterality Date  . Hiatal hernia repair  09-21-10    per Dr. Luretha Murphy, lap [employer]   . Hernia repair    . Abdominal hysterectomy    . Total knee arthroplasty  06/07/2012    Procedure: TOTAL KNEE ARTHROPLASTY;  Surgeon: Nilda Simmer, MD;  Location: [facility] OR;  Service: Orthopedics;  Laterality: Right;  . Partial colectomy      for adhesions  . Colonoscopy  10-10-05    per Dr. Juanda ChanceBrodie, repeat in 10 yrs   . Colon surgery    . Esophagogastroduodenoscopy N/A 08/29/2013    Procedure: ESOPHAGOGASTRODUODENOSCOPY (EGD);  Surgeon: Kandis Cockingavid H Newman, MD;  [facility];  Service: General;  Laterality: N/A;  . Breath tek h pylori N/A 11/28/2013    Procedure: BREATH TEK H PYLORI;  Surgeon: Valarie MerinoMatthew B Martin, MD;  [facility];  Service: General;  Laterality: N/A;  . Cataract extraction, bilateral  2016    per Dr. Dagoberto LigasStoneburner     ROS:  Weight loss with loss of appetite. Otherwise as stated in the HPI and negative for all  other systems.  PHYSICAL EXAM BP 120/68 mmHg  Pulse 74  Ht 5\' 5"  (1.651 m)  Wt 156 lb 8 oz (70.988 kg)  BMI 26.04 kg/m2 GENERAL:  Well appearing NECK:  No jugular venous distention, waveform within normal limits, carotid upstroke brisk and symmetric, no bruits, no thyromegaly LUNGS:  Clear to auscultation bilaterally BACK:  No CVA tenderness CHEST:  Unremarkable HEART:  PMI not displaced or sustained,S1 and S2 within normal limits, no S3, no S4, no clicks, no rubs, no murmurs ABD:  Flat, positive bowel sounds normal in frequency in pitch, no bruits, no rebound, no guarding, no midline pulsatile mass, no hepatomegaly, no splenomegaly EXT:  2 plus pulses throughout, no edema, no cyanosis no clubbing, right knee swelling   ASSESSMENT AND PLAN  SVT:    We had a discussion about this. She now understands the mechanism. We discussed vagal maneuvers. I'm going to start Toprol-XL 50 mg daily. However, if she fails this I will refer her for consideration of ablation.  CORONARY ARTERY DISEASE The stress perfusion study that I ordered recently was negative for any evidence of ischemia. No further workup is suggested.  CAROTID ARTERY DISEASE  She had mild nonobstructive plaque to her recent workup.  She can have this followed again 2017  DYSLIPIDEMIA:   She has  Not wanted to take a statin though this was prescribed in the past.  No change in therapy is planned.    Her LDL was recently 105 calculated.   Lab Results  Component Value Date   CHOL 173 04/19/2014   TRIG 129 04/19/2014   HDL 42 04/19/2014   LDLCALC 105* 04/19/2014   LDLDIRECT 136.4 06/19/2011

## 2015-05-03 NOTE — Patient Instructions (Signed)
Your physician wants you to follow-up in: 3 Months. You will receive a reminder letter in the mail two months in advance. If you don't receive a letter, please call our office to schedule the follow-up appointment.  Your physician has recommended you make the following change in your medication: START Metoprolol 50 mg daily

## 2015-05-25 ENCOUNTER — Telehealth: Payer: Self-pay | Admitting: Family Medicine

## 2015-05-25 NOTE — Telephone Encounter (Signed)
Per Dr. Clent RidgesFry pt [facility] to make sure her scalp is not infected. Called and spoke with pt and pt is aware.  [facility]

## 2015-05-25 NOTE — Telephone Encounter (Signed)
Ms. Rachael [name] called saying she dyed her hair two days in a row and now it itches so badly that she's "scratching her head to pieces" and she doesn't know what to do. She's wondering if she can be worked in today.   Pt's ph# [phone] Thank you.

## 2015-07-10 ENCOUNTER — Ambulatory Visit: Payer: Medicare Other | Admitting: Family Medicine

## 2015-07-12 ENCOUNTER — Ambulatory Visit: Payer: Medicare Other | Admitting: Family Medicine

## 2015-07-23 DIAGNOSIS — Z961 Presence of intraocular lens: Secondary | ICD-10-CM | POA: Diagnosis not present

## 2015-07-23 DIAGNOSIS — H01001 Unspecified blepharitis right upper eyelid: Secondary | ICD-10-CM | POA: Diagnosis not present

## 2015-07-23 DIAGNOSIS — H02831 Dermatochalasis of right upper eyelid: Secondary | ICD-10-CM | POA: Diagnosis not present

## 2015-07-23 DIAGNOSIS — H4720 Unspecified optic atrophy: Secondary | ICD-10-CM | POA: Diagnosis not present

## 2015-08-07 ENCOUNTER — Ambulatory Visit (INDEPENDENT_AMBULATORY_CARE_PROVIDER_SITE_OTHER): Payer: Medicare Other | Admitting: Family Medicine

## 2015-08-07 ENCOUNTER — Encounter: Payer: Self-pay | Admitting: Family Medicine

## 2015-08-07 VITALS — BP 129/69 | HR 67 | Temp 98.4°F | Ht 65.0 in | Wt 160.0 lb

## 2015-08-07 DIAGNOSIS — L309 Dermatitis, unspecified: Secondary | ICD-10-CM

## 2015-08-07 DIAGNOSIS — H02403 Unspecified ptosis of bilateral eyelids: Secondary | ICD-10-CM

## 2015-08-07 NOTE — Progress Notes (Signed)
   Subjective:    Patient ID: Rachael [name], female    DOB: 04/19/1931, 82 y.o.   MRN: [id number]  HPI Here for several issues. For about one month she has had patches of itchy skin on the face. Also she has had droopy eyelids for years but now they partially obscure her field of vision. No blurred vision.    Review of Systems  Constitutional: Negative.   HENT: Negative.   Eyes: Positive for visual disturbance. Negative for photophobia, pain, discharge, redness and itching.  Respiratory: Negative.   Skin: Positive for rash.       Objective:   Physical Exam  Constitutional: She is oriented to person, place, and time. She appears well-developed and well-nourished.  HENT:  Right Ear: External ear normal.  Left Ear: External ear normal.  Nose: Nose normal.  Mouth/Throat: Oropharynx is clear and moist.  Eyes: Conjunctivae and EOM are normal. Pupils are equal, round, and reactive to light.  She has ptosis of both upper eyelids  Neck: Neck supple.  Cardiovascular: Normal rate, regular rhythm, normal heart sounds and intact distal pulses.   Pulmonary/Chest: Effort normal and breath sounds normal.  Lymphadenopathy:    She has no cervical adenopathy.  Neurological: She is alert and oriented to person, place, and time.  Skin:  Several patches of pink scaly skin in the left cheek           Assessment & Plan:  Ptosis, refer to Ophthalmology for possible repair. Try 1% cortisone ointment bid for the facial eczema

## 2015-08-07 NOTE — Progress Notes (Signed)
Pre visit review using our clinic review tool, if applicable. No additional management support is needed unless otherwise documented below in the visit note. 

## 2015-08-09 ENCOUNTER — Ambulatory Visit: Payer: Medicare Other | Admitting: Cardiology

## 2015-08-31 ENCOUNTER — Telehealth: Payer: Self-pay | Admitting: Family Medicine

## 2015-08-31 DIAGNOSIS — L989 Disorder of the skin and subcutaneous tissue, unspecified: Secondary | ICD-10-CM

## 2015-08-31 NOTE — Telephone Encounter (Signed)
Referral was done  

## 2015-08-31 NOTE — Telephone Encounter (Signed)
Pt ask for a referral to see a dermatologist. She said another spot has showed up on her face.

## 2015-09-03 NOTE — Telephone Encounter (Signed)
I spoke with pt  

## 2015-09-12 DIAGNOSIS — L570 Actinic keratosis: Secondary | ICD-10-CM | POA: Diagnosis not present

## 2015-09-12 DIAGNOSIS — L821 Other seborrheic keratosis: Secondary | ICD-10-CM | POA: Diagnosis not present

## 2015-09-12 DIAGNOSIS — L814 Other melanin hyperpigmentation: Secondary | ICD-10-CM | POA: Diagnosis not present

## 2015-09-20 ENCOUNTER — Encounter: Payer: Self-pay | Admitting: Gastroenterology

## 2015-09-26 DIAGNOSIS — L570 Actinic keratosis: Secondary | ICD-10-CM | POA: Diagnosis not present

## 2015-10-01 ENCOUNTER — Ambulatory Visit (INDEPENDENT_AMBULATORY_CARE_PROVIDER_SITE_OTHER): Payer: Medicare Other | Admitting: Family Medicine

## 2015-10-01 ENCOUNTER — Encounter: Payer: Self-pay | Admitting: Family Medicine

## 2015-10-01 VITALS — BP 146/80 | HR 67 | Temp 99.0°F | Ht 65.0 in | Wt 160.0 lb

## 2015-10-01 DIAGNOSIS — J209 Acute bronchitis, unspecified: Secondary | ICD-10-CM

## 2015-10-01 MED ORDER — METHYLPREDNISOLONE ACETATE 80 MG/ML IJ SUSP
120.0000 mg | Freq: Once | INTRAMUSCULAR | Status: AC
  Administered 2015-10-01: 120 mg via INTRAMUSCULAR

## 2015-10-01 MED ORDER — CEPHALEXIN 500 MG PO CAPS: 500.0000 mg | ORAL_CAPSULE | Freq: Three times a day (TID) | ORAL | Status: AC

## 2015-10-01 NOTE — Progress Notes (Signed)
Pre visit review using our clinic review tool, if applicable. No additional management support is needed unless otherwise documented below in the visit note. 

## 2015-10-01 NOTE — Addendum Note (Signed)
Addended by: Aniceto BossNIMMONS, Lonetta Blassingame A on: 10/01/2015 10:54 AM   Modules accepted: Orders

## 2015-10-01 NOTE — Progress Notes (Signed)
   Subjective:    Patient ID: Rachael [name], female    DOB: 11/09/1930, 82 y.o.   MRN: [id number]  HPI Here for one week of chest congestion, fever, and coughing up yellow sputum.    Review of Systems  Constitutional: Positive for fever.  HENT: Positive for congestion. Negative for postnasal drip, sinus pressure and sore throat.   Eyes: Negative.   Respiratory: Positive for cough. Negative for shortness of breath and wheezing.        Objective:   Physical Exam  Constitutional: She appears well-developed and well-nourished.  HENT:  Right Ear: External ear normal.  Left Ear: External ear normal.  Nose: Nose normal.  Mouth/Throat: Oropharynx is clear and moist.  Eyes: Conjunctivae are normal.  Neck: No thyromegaly present.  Pulmonary/Chest: Effort normal. No respiratory distress. She has no wheezes. She has no rales.  Scattered rhonchi   Lymphadenopathy:    She has no cervical adenopathy.          Assessment & Plan:  Bronchitis, treat with Keflex. Drink fluids.  Nelwyn SalisburyFRY,Tosh Glaze A, MD

## 2016-01-26 ENCOUNTER — Other Ambulatory Visit: Payer: Self-pay | Admitting: Family Medicine

## 2016-02-03 ENCOUNTER — Other Ambulatory Visit: Payer: Self-pay | Admitting: Family Medicine

## 2016-03-27 ENCOUNTER — Encounter: Payer: Self-pay | Admitting: Family Medicine

## 2016-03-27 ENCOUNTER — Ambulatory Visit (INDEPENDENT_AMBULATORY_CARE_PROVIDER_SITE_OTHER): Payer: Medicare Other | Admitting: Family Medicine

## 2016-03-27 VITALS — BP 136/79 | HR 69 | Temp 98.6°F | Ht 65.0 in | Wt 155.0 lb

## 2016-03-27 DIAGNOSIS — R079 Chest pain, unspecified: Secondary | ICD-10-CM | POA: Diagnosis not present

## 2016-03-27 DIAGNOSIS — Z23 Encounter for immunization: Secondary | ICD-10-CM | POA: Diagnosis not present

## 2016-03-27 DIAGNOSIS — H02403 Unspecified ptosis of bilateral eyelids: Secondary | ICD-10-CM | POA: Insufficient documentation

## 2016-03-27 DIAGNOSIS — K219 Gastro-esophageal reflux disease without esophagitis: Secondary | ICD-10-CM | POA: Diagnosis not present

## 2016-03-27 MED ORDER — RANITIDINE HCL 300 MG PO TABS: 300.0000 mg | ORAL_TABLET | Freq: Every day | ORAL | 3 refills | Status: DC

## 2016-03-27 MED ORDER — HYDROCODONE-ACETAMINOPHEN 5-325 MG PO TABS: 1.0000 | ORAL_TABLET | Freq: Four times a day (QID) | ORAL | 0 refills | Status: DC | PRN

## 2016-03-27 MED ORDER — NITROGLYCERIN 0.3 MG SL SUBL: 0.3000 mg | SUBLINGUAL_TABLET | SUBLINGUAL | 5 refills | Status: AC | PRN

## 2016-03-27 NOTE — Progress Notes (Signed)
Pre visit review using our clinic review tool, if applicable. No additional management support is needed unless otherwise documented below in the visit note. 

## 2016-03-27 NOTE — Addendum Note (Signed)
Addended by: Aniceto BossNIMMONS, SYLVIA A on: 03/27/2016 12:19 PM   Modules accepted: Orders

## 2016-03-27 NOTE — Progress Notes (Signed)
   Subjective:    Patient ID: Rachael [name], female    DOB: 05/03/1931, 82 y.o.   MRN: [id number]  HPI Here for med refills and for other issues. Over the past year both of her upper eyelids have become droopy and they sometimes obscure part of her visual fields. Also she has been having some episodes of chest pain at night for several weeks. These can wake her up from sleep. She describes a pressure like sensation that may last from 30 to 60 minutes and then fades away. No SOB or nausea or sweats. She take 150 mg of Ranitidine every night at bedtime. She has a long hx of GERD and she has a large hiatal hernia. She had a normal Myoview stress test in November 2016.    Review of Systems  Constitutional: Negative.   Eyes: Positive for visual disturbance. Negative for photophobia, pain, discharge, redness and itching.  Respiratory: Negative.   Cardiovascular: Positive for chest pain. Negative for palpitations and leg swelling.  Gastrointestinal: Negative.   Neurological: Negative.        Objective:   Physical Exam  Constitutional: She is oriented to person, place, and time. She appears well-developed and well-nourished.  Eyes: Conjunctivae and EOM are normal. Pupils are equal, round, and reactive to light.  There is slight drooping of both upper eyelids  Neck: Neck supple. No thyromegaly present.  Cardiovascular: Normal rate, regular rhythm, normal heart sounds and intact distal pulses.   Pulmonary/Chest: Effort normal and breath sounds normal. No respiratory distress. She has no wheezes. She has no rales.  Abdominal: Soft. Bowel sounds are normal. She exhibits no distension and no mass. There is no tenderness. There is no rebound and no guarding.  Lymphadenopathy:    She has no cervical adenopathy.  Neurological: She is alert and oriented to person, place, and time.          Assessment & Plan:  She has ptosis of the eyelids and these may be amenable to surgery. We will refer her  to Ophthalmology to evaluate. Her chest pains are most likely from GERD so we will increase her nighttime Ranitidine to 300 mg.  Nelwyn SalisburyFRY,Natasia Sanko A, MD

## 2016-04-01 ENCOUNTER — Ambulatory Visit (INDEPENDENT_AMBULATORY_CARE_PROVIDER_SITE_OTHER): Payer: Medicare Other | Admitting: Family Medicine

## 2016-04-01 ENCOUNTER — Encounter: Payer: Self-pay | Admitting: Family Medicine

## 2016-04-01 VITALS — BP 129/77 | HR 77 | Ht 65.0 in | Wt 155.0 lb

## 2016-04-01 DIAGNOSIS — L820 Inflamed seborrheic keratosis: Secondary | ICD-10-CM

## 2016-04-01 NOTE — Progress Notes (Signed)
Pre visit review using our clinic review tool, if applicable. No additional management support is needed unless otherwise documented below in the visit note. 

## 2016-04-01 NOTE — Progress Notes (Signed)
   Subjective:    Patient ID: Rachael [name], female    DOB: 07/05/1930, 82 y.o.   MRN: [id number]  HPI Here to treat a number of skin lesions that are lowly growing larger and they rub on her clothing. They get irritated and painful and they sometimes bleed.    Review of Systems  Constitutional: Negative.   Respiratory: Negative.   Cardiovascular: Negative.        Objective:   Physical Exam  Constitutional: She appears well-developed and well-nourished.  Skin:  She has a number of seborrheic keratoses over the body, and we decided to treat 5 on the right leg, 3 on the left leg, and 3 on the chest. Several of these appear quite inflamed          Assessment & Plan:  Inflamed seborrheic keratoses, these were all treated with cryotherapy.  Nelwyn SalisburyFRY,Chimene Salo A, MD

## 2016-04-08 ENCOUNTER — Ambulatory Visit: Payer: Medicare Other

## 2016-04-16 ENCOUNTER — Ambulatory Visit: Payer: Medicare Other

## 2016-04-30 NOTE — Progress Notes (Signed)
HPI The patient presents for followup of palpitations, chest pain, TIA and CAD.   The patient was in 2015 with apparent TIA. She did have some visual disturbances with a TIA when she was in-hospital fall. She had mild carotid plaque. There were no dysrhythmias noted. She did have a stress perfusion study in 2013 was negative. She had a cardiac catheterization with some moderate nonobstructive disease back in 2002.  She returns for follow up.  She has had some palpitations. She says she takes nitroglycerin couple times in the month which has been a stable pattern. She takes this when she has some rapid heart rates after eating and she might get a little chest discomfort. She stopped taking her beta blocker because she wasn't aware that she continue it. She's not been taking an aspirin. She does activity such as walking with shopping. She does not feel her rapid heart rates and doesn't know when she is SVT. She doesn't have any presyncope or syncope. She has no shortness of breath, PND or orthopnea. He's had no weight gain or edema.  Allergies  Allergen Reactions  . Demerol [Meperidine]   . Meperidine Hcl Other (See Comments)    Unknown   . Sulfonamide Derivatives Other (See Comments)    Happened about 15 years ago    Current Outpatient Prescriptions  Medication Sig Dispense Refill  . allopurinol (ZYLOPRIM) 100 MG tablet TAKE ONE TABLET BY MOUTH ONCE DAILY 90 tablet 1  . aspirin EC 81 MG tablet Take 81 mg by mouth daily.    . diclofenac (VOLTAREN) 75 MG EC tablet TAKE ONE TABLET BY MOUTH TWICE DAILY 180 tablet 0  . HYDROcodone-acetaminophen (NORCO/VICODIN) 5-325 MG tablet Take 1 tablet by mouth every 6 (six) hours as needed for moderate pain. 120 tablet 0  . levothyroxine (SYNTHROID, LEVOTHROID) 100 MCG tablet TAKE ONE TABLET BY MOUTH DAILY BEFORE BREAKFAST 90 tablet 0  . metoprolol succinate (TOPROL-XL) 50 MG 24 hr tablet Take 1 tablet (50 mg total) by mouth daily. Take with or immediately  following a meal. 90 tablet 3  . nitroGLYCERIN (NITROSTAT) 0.3 MG SL tablet Place 1 tablet (0.3 mg total) under the tongue every 5 (five) minutes as needed for chest pain. 25 tablet 5  . ranitidine (ZANTAC) 300 MG tablet Take 1 tablet (300 mg total) by mouth at bedtime. 90 tablet 3   No current facility-administered medications for this visit.     Past Medical History:  Diagnosis Date  . Anemia   . Anxiety   . Arthritis   . CAD (coronary artery disease)    sees Dr. Antoine PocheHochrein, normal Myoview stress test 02-26-12   . Cataract    bilateral, per Dr. Mckinley JewelSara Stoneburner   . Depression   . Diverticulitis of colon   . GERD (gastroesophageal reflux disease)   . Gout    has seen Dr. Alben DeedsJames Beekman  . H/O hiatal hernia   . Hyperglycemia   . Hypothyroidism   . Lightheadedness   . Osteoarthritis   . Right knee DJD   . Shortness of breath    with anxiety attacks  . Supraventricular tachycardia (HCC) 04/07/12   In Dr Sherene SiresWainer's office - wore heart monitor for 3 weeks   . TIA (transient ischemic attack)    11/15    Past Surgical History:  Procedure Laterality Date  . ABDOMINAL HYSTERECTOMY    . BREATH TEK H PYLORI N/A 11/28/2013   Procedure: BREATH TEK H PYLORI;  Surgeon: Valarie MerinoMatthew B Martin,  MD;  Location: [facility];  Service: General;  Laterality: N/A;  . CATARACT EXTRACTION, BILATERAL  2016   per Dr. Dagoberto LigasStoneburner   . COLON SURGERY    . COLONOSCOPY  10-10-05   per Dr. Juanda ChanceBrodie, repeat in 10 yrs   . ESOPHAGOGASTRODUODENOSCOPY N/A 08/29/2013   Procedure: ESOPHAGOGASTRODUODENOSCOPY (EGD);  Surgeon: Kandis Cockingavid H Newman, MD;  [facility];  Service: General;  Laterality: N/A;  . HERNIA REPAIR    . HIATAL HERNIA REPAIR  09-21-10   per Dr. Luretha MurphyMatthew Martin, lap [employer]   . PARTIAL COLECTOMY     for adhesions  . TOTAL KNEE ARTHROPLASTY  06/07/2012   Procedure: TOTAL KNEE ARTHROPLASTY;  Surgeon: Nilda Simmerobert A Wainer, MD;  Location: [facility];  Service: Orthopedics;  Laterality: Right;    ROS:  Positive for  cough. Otherwise as stated in the HPI and negative for all other systems.  PHYSICAL EXAM BP 138/86   Pulse 76   Ht 5\' 5"  (1.651 m)   Wt 155 lb (70.3 kg)   BMI 25.79 kg/m  GENERAL:  Well appearing NECK:  No jugular venous distention, waveform within normal limits, carotid upstroke brisk and symmetric, no bruits, no thyromegaly LUNGS:  Clear to auscultation bilaterally BACK:  No CVA tenderness CHEST:  Unremarkable HEART:  PMI not displaced or sustained,S1 and S2 within normal limits, no S3, no S4, no clicks, no rubs, no murmurs ABD:  Flat, positive bowel sounds normal in frequency in pitch, no bruits, no rebound, no guarding, no midline pulsatile mass, no hepatomegaly, no splenomegaly EXT:  2 plus pulses throughout, no edema, no cyanosis no clubbing  EKG:  Sinus rhythm, rate 76, axis within normal limits, intervals within normal limits, nonspecific ST-T wave flattening.  ASSESSMENT AND PLAN  SVT:     We had a discussion about this today and her family was in the room.. They now understands the mechanism. We discussed vagal maneuvers. I'm going to restart Toprol-XL 50 mg daily. I will bring her back in 3 months to see if her symptoms are improved although it somewhat difficult to assess.  CORONARY ARTERY DISEASE The stress perfusion study that I ordered recently was negative for any evidence of ischemia. No further workup is suggested.  CAROTID ARTERY DISEASE  She had mild nonobstructive plaque to her recent workup.  She can have this followed again and I will schedule  DYSLIPIDEMIA:   She has not wanted to take a statin though this was prescribed in the past.  No change in therapy is planned.

## 2016-05-01 ENCOUNTER — Ambulatory Visit (INDEPENDENT_AMBULATORY_CARE_PROVIDER_SITE_OTHER): Payer: Medicare Other | Admitting: Cardiology

## 2016-05-01 ENCOUNTER — Encounter: Payer: Self-pay | Admitting: Cardiology

## 2016-05-01 VITALS — BP 138/86 | HR 76 | Ht 65.0 in | Wt 155.0 lb

## 2016-05-01 DIAGNOSIS — E785 Hyperlipidemia, unspecified: Secondary | ICD-10-CM | POA: Diagnosis not present

## 2016-05-01 DIAGNOSIS — I739 Peripheral vascular disease, unspecified: Secondary | ICD-10-CM

## 2016-05-01 DIAGNOSIS — I471 Supraventricular tachycardia: Secondary | ICD-10-CM | POA: Diagnosis not present

## 2016-05-01 DIAGNOSIS — I779 Disorder of arteries and arterioles, unspecified: Secondary | ICD-10-CM | POA: Diagnosis not present

## 2016-05-01 MED ORDER — METOPROLOL SUCCINATE ER 50 MG PO TB24: 50.0000 mg | ORAL_TABLET | Freq: Every day | ORAL | 3 refills | Status: DC

## 2016-05-01 NOTE — Patient Instructions (Signed)
Medication Instructions:  RESTART- Metoprolol and Aspirin   Labwork: None Ordered  Testing/Procedures: Your physician has requested that you have a carotid duplex. This test is an ultrasound of the carotid arteries in your neck. It looks at blood flow through these arteries that supply the brain with blood. Allow one hour for this exam. There are no restrictions or special instructions.  Follow-Up: Your physician recommends that you schedule a follow-up appointment in: 3 Months with APP   Any Other Special Instructions Will Be Listed Below (If Applicable).   If you need a refill on your cardiac medications before your next appointment, please call your pharmacy.

## 2016-05-08 ENCOUNTER — Ambulatory Visit: Payer: Medicare Other

## 2016-05-21 ENCOUNTER — Inpatient Hospital Stay (HOSPITAL_COMMUNITY): Admission: RE | Admit: 2016-05-21 | Payer: Medicare Other | Source: Ambulatory Visit

## 2016-06-16 ENCOUNTER — Telehealth: Payer: Self-pay | Admitting: Family Medicine

## 2016-06-16 NOTE — Telephone Encounter (Signed)
No chest pain now. Pt already scheduled for appt. Nothing further needed.

## 2016-06-16 NOTE — Telephone Encounter (Signed)
TELEPHONE ADVICE RECORD [facility]  Patient Name: Rachael [name]  DOB: 12/04/1930    Initial Comment Caller is having chest pain, has hernia.    Nurse Assessment  Nurse: Odis LusterBowers, RN, Bjorn Loserhonda Date/Time Lamount Cohen(Eastern Time): 06/16/2016 4:26:09 PM  Confirm and document reason for call. If symptomatic, describe symptoms. ---Caller is having chest pain, had hiatal hernia repair. Denies SOB.  Does the patient have any new or worsening symptoms? ---Yes  Will a triage be completed? ---Yes  Related visit to physician within the last 2 weeks? ---No  Does the PT have any chronic conditions? (i.e. diabetes, asthma, etc.) ---Yes  List chronic conditions. ---HH repair  Is this a behavioral health or substance abuse call? ---No     Guidelines    Guideline Title Affirmed Question Affirmed Notes  Chest Pain [1] Chest pain lasts > 5 minutes AND [2] occurred > 3 days ago (72 hours) AND [3] NO chest pain or cardiac symptoms now    Final Disposition User   See Physician within 24 Hours DuranBowers, RN, Franklin Resourceshonda    Comments  Caller reports that she has an appt with Dr. Clent RidgesFry [facility]   Referrals  REFERRED TO PCP OFFICE   Disagree/Comply: Comply

## 2016-06-17 ENCOUNTER — Ambulatory Visit (INDEPENDENT_AMBULATORY_CARE_PROVIDER_SITE_OTHER): Payer: Medicare Other | Admitting: Family Medicine

## 2016-06-17 ENCOUNTER — Encounter: Payer: Self-pay | Admitting: Family Medicine

## 2016-06-17 VITALS — BP 147/95 | HR 68 | Temp 97.9°F | Ht 65.0 in | Wt 156.0 lb

## 2016-06-17 DIAGNOSIS — Z9889 Other specified postprocedural states: Secondary | ICD-10-CM | POA: Diagnosis not present

## 2016-06-17 DIAGNOSIS — E039 Hypothyroidism, unspecified: Secondary | ICD-10-CM

## 2016-06-17 DIAGNOSIS — I471 Supraventricular tachycardia: Secondary | ICD-10-CM | POA: Diagnosis not present

## 2016-06-17 DIAGNOSIS — K219 Gastro-esophageal reflux disease without esophagitis: Secondary | ICD-10-CM

## 2016-06-17 DIAGNOSIS — R079 Chest pain, unspecified: Secondary | ICD-10-CM | POA: Diagnosis not present

## 2016-06-17 LAB — CBC WITH DIFFERENTIAL/PLATELET
BASOS ABS: 0 10*3/uL (ref 0.0–0.1)
Basophils Relative: 0.5 % (ref 0.0–3.0)
EOS ABS: 0.7 10*3/uL (ref 0.0–0.7)
Eosinophils Relative: 7.3 % — ABNORMAL HIGH (ref 0.0–5.0)
HEMATOCRIT: 44.7 % (ref 36.0–46.0)
HEMOGLOBIN: 14.8 g/dL (ref 12.0–15.0)
LYMPHS PCT: 30.9 % (ref 12.0–46.0)
Lymphs Abs: 3 10*3/uL (ref 0.7–4.0)
MCHC: 33 g/dL (ref 30.0–36.0)
MCV: 88.2 fl (ref 78.0–100.0)
MONOS PCT: 6.3 % (ref 3.0–12.0)
Monocytes Absolute: 0.6 10*3/uL (ref 0.1–1.0)
Neutro Abs: 5.3 10*3/uL (ref 1.4–7.7)
Neutrophils Relative %: 55 % (ref 43.0–77.0)
PLATELETS: 297 10*3/uL (ref 150.0–400.0)
RBC: 5.07 Mil/uL (ref 3.87–5.11)
RDW: 14.8 % (ref 11.5–15.5)
WBC: 9.6 10*3/uL (ref 4.0–10.5)

## 2016-06-17 LAB — BASIC METABOLIC PANEL
BUN: 17 mg/dL (ref 6–23)
CALCIUM: 9.5 mg/dL (ref 8.4–10.5)
CO2: 25 meq/L (ref 19–32)
CREATININE: 0.99 mg/dL (ref 0.40–1.20)
Chloride: 104 mEq/L (ref 96–112)
GFR: 56.8 mL/min — AB (ref >60.00)
GLUCOSE: 94 mg/dL (ref 70–99)
Potassium: 4.2 mEq/L (ref 3.5–5.1)
Sodium: 141 mEq/L (ref 135–145)

## 2016-06-17 LAB — HEPATIC FUNCTION PANEL
ALBUMIN: 4.2 g/dL (ref 3.5–5.2)
ALT: 10 U/L (ref 0–35)
AST: 14 U/L (ref 0–37)
Alkaline Phosphatase: 86 U/L (ref 39–117)
Bilirubin, Direct: 0.1 mg/dL (ref 0.0–0.3)
TOTAL PROTEIN: 6.8 g/dL (ref 6.0–8.3)
Total Bilirubin: 0.5 mg/dL (ref 0.2–1.2)

## 2016-06-17 LAB — TSH: TSH: 1.82 u[IU]/mL (ref 0.35–4.50)

## 2016-06-17 MED ORDER — LEVOTHYROXINE SODIUM 100 MCG PO TABS: ORAL_TABLET | ORAL | 3 refills | Status: DC

## 2016-06-17 NOTE — Progress Notes (Signed)
Pre visit review using our clinic review tool, if applicable. No additional management support is needed unless otherwise documented below in the visit note. 

## 2016-06-17 NOTE — Progress Notes (Signed)
   Subjective:    Patient ID: Rachael [name], female    DOB: 11/20/1930, 83 y.o.   MRN: [id number]  HPI Here with questions about intermittent chest pains after she eats. These pains are pressure like and occur across the chest. They are usually associated with eating a larger meal. Sometimes food stops partway down. Sometimes vomiting makes the pains stop. The pains are not associated with exertion. No SOB.    Review of Systems  Constitutional: Negative.   Respiratory: Negative.   Cardiovascular: Positive for chest pain. Negative for palpitations and leg swelling.  Gastrointestinal: Negative.   Neurological: Negative.        Objective:   Physical Exam  Constitutional: She is oriented to person, place, and time. She appears well-developed and well-nourished.  Neck: No thyromegaly present.  Cardiovascular: Normal rate, regular rhythm, normal heart sounds and intact distal pulses.   Pulmonary/Chest: Effort normal and breath sounds normal. No respiratory distress. She has no wheezes. She has no rales. She exhibits no tenderness.  Abdominal: Soft. Bowel sounds are normal. She exhibits no distension and no mass. There is no tenderness. There is no rebound and no guarding.  Lymphadenopathy:    She has no cervical adenopathy.  Neurological: She is alert and oriented to person, place, and time.          Assessment & Plan:  She is having chest pains that sound esophageal to me. I do not think these are cardiac in nature. She is taking Ranitidine 300 mg bid already. We will refer her to GI to evaluate. She may require another upper endoscopy. Get labs today.  Gershon CraneStephen Erleen Egner, MD

## 2016-06-19 ENCOUNTER — Telehealth: Payer: Self-pay | Admitting: Family Medicine

## 2016-06-19 NOTE — Telephone Encounter (Signed)
Pt would like blood work result  °

## 2016-06-20 ENCOUNTER — Other Ambulatory Visit: Payer: Self-pay | Admitting: Family Medicine

## 2016-06-20 NOTE — Telephone Encounter (Signed)
Pt calling for results of labs

## 2016-06-20 NOTE — Telephone Encounter (Signed)
I spoke with pt, went over results and put a copy in the mail to pt, per her request.

## 2016-06-25 ENCOUNTER — Ambulatory Visit (HOSPITAL_COMMUNITY)
Admission: RE | Admit: 2016-06-25 | Discharge: 2016-06-25 | Disposition: A | Discharge status: Home / Self Care | Payer: Medicare Other | Source: Ambulatory Visit | Attending: Cardiovascular Disease | Admitting: Cardiovascular Disease

## 2016-06-25 DIAGNOSIS — I739 Peripheral vascular disease, unspecified: Secondary | ICD-10-CM

## 2016-06-25 DIAGNOSIS — I779 Disorder of arteries and arterioles, unspecified: Secondary | ICD-10-CM | POA: Diagnosis not present

## 2016-06-26 ENCOUNTER — Ambulatory Visit (INDEPENDENT_AMBULATORY_CARE_PROVIDER_SITE_OTHER): Payer: Medicare Other | Admitting: Gastroenterology

## 2016-06-26 ENCOUNTER — Telehealth: Payer: Self-pay | Admitting: Gastroenterology

## 2016-06-26 ENCOUNTER — Encounter: Payer: Self-pay | Admitting: Gastroenterology

## 2016-06-26 VITALS — BP 124/74 | HR 80 | Ht 62.0 in | Wt 152.5 lb

## 2016-06-26 DIAGNOSIS — R0789 Other chest pain: Secondary | ICD-10-CM

## 2016-06-26 DIAGNOSIS — Z8719 Personal history of other diseases of the digestive system: Secondary | ICD-10-CM

## 2016-06-26 DIAGNOSIS — K219 Gastro-esophageal reflux disease without esophagitis: Secondary | ICD-10-CM

## 2016-06-26 MED ORDER — OMEPRAZOLE 40 MG PO CPDR: 40.0000 mg | DELAYED_RELEASE_CAPSULE | Freq: Every day | ORAL | 1 refills | Status: DC

## 2016-06-26 NOTE — Patient Instructions (Signed)
If you are age 81 or older, your body mass index should be between 23-30. Your Body mass index is 27.89 kg/m. If this is out of the aforementioned range listed, please consider follow up with your Primary Care Provider.  If you are age 81 or younger, your body mass index should be between 19-25. Your Body mass index is 27.89 kg/m. If this is out of the aformentioned range listed, please consider follow up with your Primary Care Provider.   We have sent the following medications to your pharmacy for you to pick up at your convenience:  Omeprazole  Please use Zantac as needed.  You have been scheduled for a [facility] (1st floor of the hospital) on Friday Febraury 16th at 10:30am. Please arrive 15 minutes prior to your appointment for registration. Make certain not to have anything to eat or drink 3 hours prior to your test. If you need to reschedule for any reason, please contact radiology at [phone] to do so. __________________________________________________________________ A barium swallow is an examination that concentrates on views of the esophagus. This tends to be a double contrast exam (barium and two liquids which, when combined, create a gas to distend the wall of the oesophagus) or single contrast (non-ionic iodine based). The study is usually tailored to your symptoms so a good history is essential. Attention is paid during the study to the form, structure and configuration of the esophagus, looking for functional disorders (such as aspiration, dysphagia, achalasia, motility and reflux) EXAMINATION You may be asked to change into a gown, depending on the type of swallow being performed. A radiologist and radiographer will perform the procedure. The radiologist will advise you of the type of contrast selected for your procedure and direct you during the exam. You will be asked to stand, sit or lie in several different positions and to hold a small  amount of fluid in your mouth before being asked to swallow while the imaging is performed .In some instances you may be asked to swallow barium coated marshmallows to assess the motility of a solid food bolus. The exam can be recorded as a digital or video fluoroscopy procedure. POST PROCEDURE It will take 1-2 days for the barium to pass through your system. To facilitate this, it is important, unless otherwise directed, to increase your fluids for the next 24-48hrs and to resume your normal diet.  This test typically takes about 30 minutes to perform. __________________________________________________________________________________

## 2016-06-26 NOTE — Progress Notes (Signed)
HPI :  83 year old female with a history of TIA and SVT, reflux with hiatal hernia repair, here for a follow-up visit. She has not been seen since 2015 and is a new patient to me.   Patient has history of TIA in 2015. She has seen cardiology in 05/01/16, thought to have SVT and restarted her on toprol XL  She has had chronic pains in her chest since she has had hiatal hernia repair a few years ago. She has chronic discomfort present all the time in her chest. She denies odynophagia. She denies dysphagia. She does endorse regurgitation and occasional vomiting if severe. She does endorse pyrosis. She takes ranitidine 300mg  qHS and 150mg  q day PRN. She endorses breakthrough reflux despite ranitidine. She also uses peppermint supplement which helps. She is prescribed voltaren but does not use this routinely, only PRN. She reports she has lost about 10 lbs over the past year or so.  She has been on PPIs in the past but not recenlty.   CT abdomen / pelvis - 03/17/14 - sigmoid diverticulosis, simple renal cysts, moderate hiatal hernia, atherosclerosis of aorta CT scan of her chest in 01/2015 - small recurrence of hiatal hernia  Negative nuclear stress 03/2015  EGD 08/30/13 - Dr. Ezzard StandingNewman, showed hiatal hernia Last colonoscopy done 2007 showed moderately severe diverticulosis, no polyps.  Past Medical History:  Diagnosis Date  . Anemia   . Anxiety   . Arthritis   . CAD (coronary artery disease)    sees Dr. Antoine PocheHochrein, normal Myoview stress test 02-26-12   . Cataract    bilateral, per Dr. Mckinley JewelSara Stoneburner   . Depression   . Diverticulitis of colon   . GERD (gastroesophageal reflux disease)   . Gout    has seen Dr. Alben DeedsJames Beekman  . H/O hiatal hernia   . Hyperglycemia   . Hypothyroidism   . Lightheadedness   . Osteoarthritis   . Right knee DJD   . Shortness of breath    with anxiety attacks  . Supraventricular tachycardia (HCC) 04/07/12   In Dr Sherene SiresWainer's office - wore heart monitor for 3 weeks    . TIA (transient ischemic attack)    11/15     Past Surgical History:  Procedure Laterality Date  . ABDOMINAL HYSTERECTOMY    . BREATH TEK H PYLORI N/A 11/28/2013   Procedure: BREATH TEK H PYLORI;  Surgeon: Valarie MerinoMatthew B Martin, MD;  [facility];  Service: General;  Laterality: N/A;  . CATARACT EXTRACTION, BILATERAL  2016   per Dr. Dagoberto LigasStoneburner   . COLON SURGERY    . COLONOSCOPY  10-10-05   per Dr. Juanda ChanceBrodie, repeat in 10 yrs   . ESOPHAGOGASTRODUODENOSCOPY N/A 08/29/2013   Procedure: ESOPHAGOGASTRODUODENOSCOPY (EGD);  Surgeon: Kandis Cockingavid H Newman, MD;  [facility];  Service: General;  Laterality: N/A;  . HERNIA REPAIR    . HIATAL HERNIA REPAIR  09-21-10   per Dr. Luretha MurphyMatthew Martin, lap [employer]   . PARTIAL COLECTOMY     for adhesions  . TOTAL KNEE ARTHROPLASTY  06/07/2012   Procedure: TOTAL KNEE ARTHROPLASTY;  Surgeon: Nilda Simmerobert A Wainer, MD;  Location: [facility];  Service: Orthopedics;  Laterality: Right;   Family History  Problem Relation Age of Onset  . Diabetes Mellitus II    . Heart disease    . Cervical cancer Daughter    Social History  Substance Use Topics  . Smoking status: Never Smoker  . Smokeless tobacco: Never Used  . Alcohol use No  Current Outpatient Prescriptions  Medication Sig Dispense Refill  . allopurinol (ZYLOPRIM) 100 MG tablet TAKE ONE TABLET BY MOUTH ONCE DAILY 90 tablet 1  . diclofenac (VOLTAREN) 75 MG EC tablet TAKE ONE TABLET BY MOUTH TWICE DAILY (Patient taking differently: as needed) 180 tablet 0  . HYDROcodone-acetaminophen (NORCO/VICODIN) 5-325 MG tablet Take 1 tablet by mouth every 6 (six) hours as needed for moderate pain. (Patient taking differently: Take 1 tablet by mouth as needed for moderate pain. ) 120 tablet 0  . levothyroxine (SYNTHROID, LEVOTHROID) 100 MCG tablet TAKE ONE TABLET BY MOUTH DAILY BEFORE BREAKFAST 90 tablet 3  . metoprolol succinate (TOPROL-XL) 50 MG 24 hr tablet Take 1 tablet (50 mg total) by mouth daily. Take with or  immediately following a meal. 90 tablet 3  . nitroGLYCERIN (NITROSTAT) 0.3 MG SL tablet Place 1 tablet (0.3 mg total) under the tongue every 5 (five) minutes as needed for chest pain. 25 tablet 5  . ranitidine (ZANTAC) 150 MG tablet TAKE ONE TABLET BY MOUTH AT BEDTIME 90 tablet 2  . ranitidine (ZANTAC) 300 MG tablet Take 1 tablet (300 mg total) by mouth at bedtime. (Patient taking differently: Take 300 mg by mouth as needed. ) 90 tablet 3   No current facility-administered medications for this visit.    Allergies  Allergen Reactions  . Demerol [Meperidine]   . Meperidine Hcl Other (See Comments)    Unknown   . Sulfonamide Derivatives Other (See Comments)    Happened about 15 years ago     Review of Systems: All systems reviewed and negative except where noted in HPI.   Lab Results  Component Value Date   WBC 9.6 06/17/2016   HGB 14.8 06/17/2016   HCT 44.7 06/17/2016   MCV 88.2 06/17/2016   PLT 297.0 06/17/2016    Lab Results  Component Value Date   CREATININE 0.99 06/17/2016   BUN 17 06/17/2016   NA 141 06/17/2016   K 4.2 06/17/2016   CL 104 06/17/2016   CO2 25 06/17/2016    Lab Results  Component Value Date   ALT 10 06/17/2016   AST 14 06/17/2016   ALKPHOS 86 06/17/2016   BILITOT 0.5 06/17/2016     Physical Exam: BP 124/74   Pulse 80   Ht 5\' 2"  (1.575 m)   Wt 152 lb 8 oz (69.2 kg)   BMI 27.89 kg/m  Constitutional: Pleasant,well-developed, female in no acute distress. HEENT: Normocephalic and atraumatic. Conjunctivae are normal. No scleral icterus. Neck supple.  Cardiovascular: Normal rate, regular rhythm.  Pulmonary/chest: Effort normal and breath sounds normal. No wheezing, rales or rhonchi. Abdominal: Soft, nondistended, nontender. . There are no masses palpable. No hepatomegaly. Extremities: no edema Lymphadenopathy: No cervical adenopathy noted. Neurological: Alert and oriented to person place and time. Skin: Skin is warm and dry. No rashes  noted. Psychiatric: Normal mood and affect. Behavior is normal.   ASSESSMENT AND PLAN: 83 year old female with history as outlined above, presenting with atypical chest pain and ongoing reflux symptoms, with a history of hiatal hernia repair and with subsequent recurrence. She's had a CT scan of her chest for these symptoms in the past which did not show any clear cause for her symptoms.  I counseled her that I think it's very likely her symptoms are related to her esophagus. Suspect she is having breakthrough reflux on ranitidine, with long-term use she may have developed tachyphylaxis with this. I discussed risks and benefits of PPIs with her , and I  think a short course of omeprazole 40 mg once a day is reasonable to see if this provides benefit. She is very concerned her symptoms are due to hiatal hernia or her "lungs". I reassured her I don't think this is coming from her lungs although possible she has had interval enlargement of her hiatal hernia and reflux is worse. We discussed endoscopy versus barium evaluation to further evaluate, given her comorbidities will proceed with a barium swallow to assess her esophagus, motility, rule out worsening hiatal hernia. If symptoms persist despite change in medication, pending barium study, we may consider upper endoscopy. She agreed with the plan.   Ileene Patrick, MD [facility] Pager [phone]  CC: Nelwyn Salisbury, MD

## 2016-06-26 NOTE — Telephone Encounter (Signed)
Pt will take OTC Omeprazole 20mg  BID

## 2016-07-01 ENCOUNTER — Other Ambulatory Visit: Payer: Self-pay | Admitting: Family Medicine

## 2016-07-07 ENCOUNTER — Telehealth: Payer: Self-pay | Admitting: Gastroenterology

## 2016-07-07 NOTE — Telephone Encounter (Signed)
Patient calling back not happy that she hasn't received a call back yet about problems with her omeprazole

## 2016-07-08 ENCOUNTER — Other Ambulatory Visit: Payer: Self-pay

## 2016-07-08 MED ORDER — OMEPRAZOLE 20 MG PO CPDR: 20.0000 mg | DELAYED_RELEASE_CAPSULE | Freq: Two times a day (BID) | ORAL | 3 refills | Status: DC

## 2016-07-08 MED ORDER — OMEPRAZOLE 20 MG PO CPDR: 20.0000 mg | DELAYED_RELEASE_CAPSULE | Freq: Two times a day (BID) | ORAL | 1 refills | Status: DC

## 2016-07-08 NOTE — Telephone Encounter (Signed)
omep 20mg  sent

## 2016-07-08 NOTE — Telephone Encounter (Signed)
Pt is requesting a new Rx for Omeprazole. She would like to take 20mg  BID instead of 40mg  qd. She feels that it helps her better during the evening to take BID

## 2016-07-08 NOTE — Telephone Encounter (Signed)
Spoke with patient last night, she has been having cost issues with obtaining the prescription omeprazole 40 mg capsules. She in the meantime has used the OTC, 20 mg capsules taking two in the morning. She states that during the day she does not have a lot of issues with reflux, more so in the evening and at night. She divided the doses and had good control taking 20 mg bid. Wondering if I can change her prescription to taking omeprazole 20 mg BID? If so, she can then check with different pharmacies for best price.. Thanks.

## 2016-07-08 NOTE — Telephone Encounter (Signed)
That's fine, please make the change to 20mg  BID. Thanks

## 2016-07-11 ENCOUNTER — Ambulatory Visit (HOSPITAL_COMMUNITY)
Admission: RE | Admit: 2016-07-11 | Discharge: 2016-07-11 | Disposition: A | Discharge status: Home / Self Care | Payer: Medicare Other | Source: Ambulatory Visit | Attending: Gastroenterology | Admitting: Gastroenterology

## 2016-07-11 DIAGNOSIS — K219 Gastro-esophageal reflux disease without esophagitis: Secondary | ICD-10-CM | POA: Diagnosis not present

## 2016-07-11 DIAGNOSIS — R0789 Other chest pain: Secondary | ICD-10-CM

## 2016-07-11 DIAGNOSIS — K449 Diaphragmatic hernia without obstruction or gangrene: Secondary | ICD-10-CM | POA: Insufficient documentation

## 2016-07-11 DIAGNOSIS — K225 Diverticulum of esophagus, acquired: Secondary | ICD-10-CM | POA: Diagnosis not present

## 2016-07-15 ENCOUNTER — Telehealth: Payer: Self-pay

## 2016-07-15 NOTE — Telephone Encounter (Signed)
Tried to call patient back to relay Dr. Lanetta InchArmbruster's recommendations about the Cologuard test. Unable to leave vm, patient did not answer phone. Will send letter to her.

## 2016-07-15 NOTE — Progress Notes (Signed)
Letter mailed

## 2016-07-17 ENCOUNTER — Ambulatory Visit (INDEPENDENT_AMBULATORY_CARE_PROVIDER_SITE_OTHER): Payer: Medicare Other | Admitting: Family Medicine

## 2016-07-17 ENCOUNTER — Encounter: Payer: Self-pay | Admitting: Family Medicine

## 2016-07-17 VITALS — BP 120/64 | HR 62 | Temp 98.3°F | Ht 62.0 in | Wt 159.0 lb

## 2016-07-17 DIAGNOSIS — J209 Acute bronchitis, unspecified: Secondary | ICD-10-CM | POA: Diagnosis not present

## 2016-07-17 MED ORDER — METHYLPREDNISOLONE ACETATE 80 MG/ML IJ SUSP
120.0000 mg | Freq: Once | INTRAMUSCULAR | Status: AC
  Administered 2016-07-17: 120 mg via INTRAMUSCULAR

## 2016-07-17 MED ORDER — LORATADINE 10 MG PO TABS: 10.0000 mg | ORAL_TABLET | Freq: Every day | ORAL | 11 refills | Status: DC

## 2016-07-17 MED ORDER — CEPHALEXIN 500 MG PO CAPS: 500.0000 mg | ORAL_CAPSULE | Freq: Three times a day (TID) | ORAL | 0 refills | Status: AC

## 2016-07-17 NOTE — Progress Notes (Signed)
   Subjective:    Patient ID: Rachael [name], female    DOB: 07/19/1930, 83 y.o.   MRN: [id number]  HPI Here for 2 weeks of chest congestion and coughing up green sputum. No fever.    Review of Systems  Constitutional: Negative.   HENT: Negative.   Eyes: Negative.   Respiratory: Positive for cough and chest tightness. Negative for shortness of breath and wheezing.        Objective:   Physical Exam  Constitutional: She appears well-developed and well-nourished.  HENT:  Right Ear: External ear normal.  Left Ear: External ear normal.  Nose: Nose normal.  Mouth/Throat: Oropharynx is clear and moist.  Eyes: Conjunctivae are normal.  Neck: No thyromegaly present.  Pulmonary/Chest: No respiratory distress. She has no rales.  Scattered rhonchi and wheezes   Lymphadenopathy:    She has no cervical adenopathy.          Assessment & Plan:  Bronchitis, treat with Keflex and a steroid shot.  Gershon CraneStephen Molina Hollenback, MD

## 2016-07-17 NOTE — Progress Notes (Signed)
Pre visit review using our clinic review tool, if applicable. No additional management support is needed unless otherwise documented below in the visit note. 

## 2016-07-17 NOTE — Addendum Note (Signed)
Addended by: Neville RouteJAIMES, PATRICIA G on: 07/17/2016 11:54 AM   Modules accepted: Orders

## 2017-01-06 ENCOUNTER — Encounter: Payer: Self-pay | Admitting: Family Medicine

## 2017-01-06 ENCOUNTER — Ambulatory Visit (INDEPENDENT_AMBULATORY_CARE_PROVIDER_SITE_OTHER): Payer: Medicare Other | Admitting: Family Medicine

## 2017-01-06 VITALS — BP 140/96 | HR 74 | Temp 98.4°F | Ht 62.0 in | Wt 159.0 lb

## 2017-01-06 DIAGNOSIS — K219 Gastro-esophageal reflux disease without esophagitis: Secondary | ICD-10-CM

## 2017-01-06 MED ORDER — IBUPROFEN 800 MG PO TABS: 800.0000 mg | ORAL_TABLET | Freq: Three times a day (TID) | ORAL | 5 refills | Status: AC | PRN

## 2017-01-06 NOTE — Progress Notes (Signed)
   Subjective:    Patient ID: Rachael [name] [place], female    DOB: 06/23/1930, 83 y.o.   MRN: [id number]  HPI Here for advice about her GERD. She has used both Omeprazole and Ranitidine for this, buit never together. She gets partial relief but still has breakthrough reflux. No trouble swallowing.    Review of Systems  Constitutional: Negative.   Respiratory: Negative.   Cardiovascular: Negative.   Gastrointestinal: Negative.        Objective:   Physical Exam  Constitutional: She appears well-developed and well-nourished.  Neck: No thyromegaly present.  Cardiovascular: Normal rate, regular rhythm, normal heart sounds and intact distal pulses.   Pulmonary/Chest: Effort normal and breath sounds normal. No respiratory distress. She has no wheezes. She has no rales.  Abdominal: Soft. Bowel sounds are normal. She exhibits no distension and no mass. There is no tenderness. There is no rebound and no guarding.  Lymphadenopathy:    She has no cervical adenopathy.          Assessment & Plan:  GERD, she will take Omeprazole 20 mg at bedtime and Ranitidine 300 mg at lunchtime.  Gershon CraneStephen Fry, MD

## 2017-01-06 NOTE — Patient Instructions (Signed)
WE NOW OFFER   [facility] FAST TRACK!!!  SAME DAY Appointments for ACUTE CARE  Such as: Sprains, Injuries, cuts, abrasions, rashes, muscle pain, joint pain, back pain Colds, flu, sore throats, headache, allergies, cough, fever  Ear pain, sinus and eye infections Abdominal pain, nausea, vomiting, diarrhea, upset stomach Animal/insect bites  3 Easy Ways to Schedule: Walk-In Scheduling Call in scheduling Mychart Sign-up: [email or web address]         

## 2017-02-12 ENCOUNTER — Other Ambulatory Visit: Payer: Self-pay | Admitting: Gastroenterology

## 2017-02-12 ENCOUNTER — Other Ambulatory Visit: Payer: Self-pay | Admitting: Family Medicine

## 2017-02-12 ENCOUNTER — Encounter: Payer: Self-pay | Admitting: Family Medicine

## 2017-05-13 ENCOUNTER — Encounter: Payer: Self-pay | Admitting: Family Medicine

## 2017-05-13 ENCOUNTER — Ambulatory Visit (INDEPENDENT_AMBULATORY_CARE_PROVIDER_SITE_OTHER): Payer: Medicare Other | Admitting: Family Medicine

## 2017-05-13 ENCOUNTER — Ambulatory Visit (INDEPENDENT_AMBULATORY_CARE_PROVIDER_SITE_OTHER): Payer: Medicare Other

## 2017-05-13 VITALS — BP 124/80 | HR 86 | Temp 98.2°F | Wt 163.0 lb

## 2017-05-13 VITALS — BP 124/80 | HR 86 | Ht 63.0 in | Wt 164.0 lb

## 2017-05-13 DIAGNOSIS — E039 Hypothyroidism, unspecified: Secondary | ICD-10-CM | POA: Diagnosis not present

## 2017-05-13 DIAGNOSIS — E538 Deficiency of other specified B group vitamins: Secondary | ICD-10-CM

## 2017-05-13 DIAGNOSIS — Z23 Encounter for immunization: Secondary | ICD-10-CM

## 2017-05-13 DIAGNOSIS — E785 Hyperlipidemia, unspecified: Secondary | ICD-10-CM

## 2017-05-13 DIAGNOSIS — R7309 Other abnormal glucose: Secondary | ICD-10-CM | POA: Diagnosis not present

## 2017-05-13 DIAGNOSIS — M1000 Idiopathic gout, unspecified site: Secondary | ICD-10-CM

## 2017-05-13 DIAGNOSIS — Z0000 Encounter for general adult medical examination without abnormal findings: Secondary | ICD-10-CM

## 2017-05-13 LAB — BASIC METABOLIC PANEL
BUN: 21 mg/dL (ref 6–23)
CO2: 27 mEq/L (ref 19–32)
CREATININE: 1.02 mg/dL (ref 0.40–1.20)
Calcium: 9.4 mg/dL (ref 8.4–10.5)
Chloride: 104 mEq/L (ref 96–112)
GFR: 54.76 mL/min — AB (ref >60.00)
Glucose, Bld: 87 mg/dL (ref 70–99)
Potassium: 3.9 mEq/L (ref 3.5–5.1)
Sodium: 141 mEq/L (ref 135–145)

## 2017-05-13 LAB — CBC WITH DIFFERENTIAL/PLATELET
Basophils Absolute: 0.1 10*3/uL (ref 0.0–0.1)
Basophils Relative: 1 % (ref 0.0–3.0)
EOS PCT: 2.5 % (ref 0.0–5.0)
Eosinophils Absolute: 0.2 10*3/uL (ref 0.0–0.7)
HEMATOCRIT: 46 % (ref 36.0–46.0)
Hemoglobin: 14.8 g/dL (ref 12.0–15.0)
LYMPHS ABS: 2.4 10*3/uL (ref 0.7–4.0)
LYMPHS PCT: 31.8 % (ref 12.0–46.0)
MCHC: 32.3 g/dL (ref 30.0–36.0)
MCV: 91 fl (ref 78.0–100.0)
MONOS PCT: 7.1 % (ref 3.0–12.0)
Monocytes Absolute: 0.5 10*3/uL (ref 0.1–1.0)
NEUTROS PCT: 57.6 % (ref 43.0–77.0)
Neutro Abs: 4.3 10*3/uL (ref 1.4–7.7)
Platelets: 289 10*3/uL (ref 150.0–400.0)
RBC: 5.05 Mil/uL (ref 3.87–5.11)
RDW: 14.6 % (ref 11.5–15.5)
WBC: 7.4 10*3/uL (ref 4.0–10.5)

## 2017-05-13 LAB — LIPID PANEL
Cholesterol: 215 mg/dL — ABNORMAL HIGH (ref 0–200)
HDL: 48.8 mg/dL (ref >39.00)
LDL Cholesterol: 140 mg/dL — ABNORMAL HIGH (ref 0–99)
NonHDL: 166.24
Total CHOL/HDL Ratio: 4
Triglycerides: 133 mg/dL (ref 0.0–149.0)
VLDL: 26.6 mg/dL (ref 0.0–40.0)

## 2017-05-13 LAB — HEPATIC FUNCTION PANEL
ALT: 10 U/L (ref 0–35)
AST: 12 U/L (ref 0–37)
Albumin: 4.1 g/dL (ref 3.5–5.2)
Alkaline Phosphatase: 87 U/L (ref 39–117)
BILIRUBIN DIRECT: 0.1 mg/dL (ref 0.0–0.3)
BILIRUBIN TOTAL: 0.5 mg/dL (ref 0.2–1.2)
Total Protein: 7 g/dL (ref 6.0–8.3)

## 2017-05-13 LAB — POC URINALSYSI DIPSTICK (AUTOMATED)
Blood, UA: NEGATIVE
Glucose, UA: NEGATIVE
LEUKOCYTES UA: NEGATIVE
NITRITE UA: NEGATIVE
PROTEIN UA: NEGATIVE
Spec Grav, UA: >=1.03 — AB (ref 1.010–1.025)
Urobilinogen, UA: 0.2 E.U./dL
pH, UA: 5.5 (ref 5.0–8.0)

## 2017-05-13 LAB — HEMOGLOBIN A1C: Hgb A1c MFr Bld: 6.1 % (ref 4.6–6.5)

## 2017-05-13 LAB — VITAMIN B12: Vitamin B-12: >1500 pg/mL — ABNORMAL HIGH (ref 211–911)

## 2017-05-13 LAB — TSH: TSH: 3.45 u[IU]/mL (ref 0.35–4.50)

## 2017-05-13 LAB — URIC ACID: Uric Acid, Serum: 5.1 mg/dL (ref 2.4–7.0)

## 2017-05-13 NOTE — Progress Notes (Addendum)
Subjective:   Rachael [name] is a 83 y.o. female who presents for Medicare Annual (Subsequent) preventive examination.  Diet  Needs dental care and given resources  bacon and egg sandwich for breakfast Fried potatoes or toast Lunch; skip lunch and eats 3 to 4:00 Cheese burger or stir fry    Exercise Does her Home cleaning  Laundry etc  Lives with dtr  Cardiac Risk Factors include: family history of premature cardiovascular disease;hypertension;advanced age (>7655men, 69>65 women);dyslipidemia;sedentary lifestyle BMI 29      Objective:     Vitals: BP 124/80   Pulse 86   Ht 5\' 3"  (1.6 m)   Wt 164 lb (74.4 kg)   BMI 29.05 kg/m   Body mass index is 29.05 kg/m.  Advanced Directives 05/13/2017 04/18/2014 04/18/2014 08/29/2013 06/03/2012  Does Patient Have a Medical Advance Directive? Yes No No Patient does not have advance directive Patient does not have advance directive  Would patient like information on creating a medical advance directive? - Yes - Educational materials given No - patient declined information - -  Pre-existing out of facility DNR order (yellow form or pink MOST form) - - - - No    Tobacco Social History   Tobacco Use  Smoking Status Never Smoker  Smokeless Tobacco Never Used     Counseling given: Yes   Clinical Intake:     Past Medical History:  Diagnosis Date  . Anemia   . Anxiety   . Arthritis   . CAD (coronary artery disease)    sees Dr. Antoine PocheHochrein, normal Myoview stress test 02-26-12   . Cataract    bilateral, per Dr. Mckinley JewelSara Stoneburner   . Depression   . Diverticulitis of colon   . GERD (gastroesophageal reflux disease)   . Gout    has seen Dr. Alben DeedsJames Beekman  . H/O hiatal hernia   . Hyperglycemia   . Hypothyroidism   . Lightheadedness   . Osteoarthritis   . Right knee DJD   . Shortness of breath    with anxiety attacks  . Supraventricular tachycardia (HCC) 04/07/12   In Dr Sherene SiresWainer's office - wore heart monitor for 3 weeks   . TIA  (transient ischemic attack)    11/15   Past Surgical History:  Procedure Laterality Date  . ABDOMINAL HYSTERECTOMY    . BREATH TEK H PYLORI N/A 11/28/2013   Procedure: BREATH TEK H PYLORI;  Surgeon: Valarie MerinoMatthew B Martin, MD;  Location: [facility];  Service: General;  Laterality: N/A;  . CATARACT EXTRACTION, BILATERAL  2016   per Dr. Dagoberto LigasStoneburner   . COLON SURGERY    . COLONOSCOPY  10-10-05   per Dr. Juanda ChanceBrodie, repeat in 10 yrs   . ESOPHAGOGASTRODUODENOSCOPY N/A 08/29/2013   Procedure: ESOPHAGOGASTRODUODENOSCOPY (EGD);  Surgeon: Kandis Cockingavid H Newman, MD;  Location: [facility];  Service: General;  Laterality: N/A;  . HERNIA REPAIR    . HIATAL HERNIA REPAIR  09-21-10   per Dr. Luretha MurphyMatthew Martin, lap [employer]   . PARTIAL COLECTOMY     for adhesions  . TOTAL KNEE ARTHROPLASTY  06/07/2012   Procedure: TOTAL KNEE ARTHROPLASTY;  Surgeon: Nilda Simmerobert A Wainer, MD;  Location: [facility];  Service: Orthopedics;  Laterality: Right;   Family History  Problem Relation Age of Onset  . Diabetes Mellitus II Unknown   . Heart disease Unknown   . Cervical cancer Daughter    Social History   Socioeconomic History  . Marital status: Divorced    Spouse name: Not on  file  . Number of children: 4  . Years of education: Not on file  . Highest education level: Not on file  Social Needs  . Financial resource strain: Not on file  . Food insecurity - worry: Not on file  . Food insecurity - inability: Not on file  . Transportation needs - medical: Not on file  . Transportation needs - non-medical: Not on file  Occupational History  . Not on file  Tobacco Use  . Smoking status: Never Smoker  . Smokeless tobacco: Never Used  Substance and Sexual Activity  . Alcohol use: No    Alcohol/week: 0.0 oz  . Drug use: No  . Sexual activity: Not on file  Other Topics Concern  . Not on file  Social History Narrative  . Not on file    Outpatient Encounter Medications as of 05/13/2017  Medication Sig  . allopurinol (ZYLOPRIM) 100  MG tablet TAKE ONE TABLET BY MOUTH ONCE DAILY  . ibuprofen (ADVIL,MOTRIN) 800 MG tablet Take 1 tablet (800 mg total) by mouth every 8 (eight) hours as needed.  Marland Kitchen. levothyroxine (SYNTHROID, LEVOTHROID) 100 MCG tablet TAKE ONE TABLET BY MOUTH DAILY BEFORE BREAKFAST  . metoprolol succinate (TOPROL-XL) 50 MG 24 hr tablet Take 1 tablet (50 mg total) by mouth daily. Take with or immediately following a meal.  . nitroGLYCERIN (NITROSTAT) 0.3 MG SL tablet Place 1 tablet (0.3 mg total) under the tongue every 5 (five) minutes as needed for chest pain.  Marland Kitchen. omeprazole (PRILOSEC) 20 MG capsule TAKE 1 CAPSULE BY MOUTH TWICE DAILY BEFORE  A  MEAL   No facility-administered encounter medications on file as of 05/13/2017.     Activities of Daily Living In your present state of health, do you have any difficulty performing the following activities: 05/13/2017 05/13/2017  Hearing? - -  Vision? - -  Difficulty concentrating or making decisions? - N  Walking or climbing stairs? - N  Dressing or bathing? - N  Doing errands, shopping? - N  Quarry managerreparing Food and eating ? - N  Using the Toilet? - N  In the past six months, have you accidently leaked urine? N -  Do you have problems with loss of bowel control? N -  Managing your Medications? N -  Managing your Finances? N -  Housekeeping or managing your Housekeeping? N -  Some recent data might be hidden    Patient Care Team: Nelwyn SalisburyFry, Stephen A, MD as PCP - General    Assessment:   This is a routine wellness examination for Rachael JunesDorothea.  Exercise Activities and Dietary recommendations Current Exercise Habits: Home exercise routine, Intensity: Mild  Goals    . Patient Stated     To stay healthy and engaged   Go to the Silver sneaker program        Fall Risk Fall Risk  05/13/2017 05/13/2017 04/26/2014  Falls in the past year? No No Yes  Number falls in past yr: - - 1  Injury with Fall? - - Yes   Still takes showers in tub Handrails slipped and given  resource for [employer] which can fix her grab bars in the bathroom  Mentioned she may try the silver sneaker program   Timed Get Up and Go performed: 15 sec Holds right knee when getting up and takes time to balance. Walks slow; has AD at home; cane and walker   Depression Screen PHQ 2/9 Scores 05/13/2017  PHQ - 2 Score 0  Cognitive Function MMSE - Mini Mental State Exam 05/13/2017  Not completed: (No Data)    manages well; is a good historian;  Has concern over paid insurance premium going up  Given resource for the [employer] at [employer] History  Administered Date(s) Administered  . Influenza Whole 02/08/2010  . Influenza, High Dose Seasonal PF 01/31/2015, 03/27/2016, 05/13/2017  . Pneumococcal Conjugate-13 01/31/2015  . Pneumococcal Polysaccharide-23 04/05/2008  . Td 05/26/2008    Qualifies for Shingles Vaccine? Given information   Screening Tests Health Maintenance  Topic Date Due  . INFLUENZA VACCINE  12/24/2016  . DEXA SCAN  05/13/2018 (Originally 07/04/1997)  . TETANUS/TDAP  05/26/2018  . PNA vac Low Risk Adult  Completed        Plan:     PCP Notes   Health Maintenance Recommended she have an eye exam  Has dental issues and given resources for dental schools and other resources   Educated regarding the shingrix  Had flu vaccine in clinic today  Abnormal Screens  no  Referrals  none  Patient concerns; Dental issues Insurance problem; referred to [facility]   Nurse Concerns; As noted  Next PCP apt today      I have personally reviewed and noted the following in the patient's chart:   . Medical and social history . Use of alcohol, tobacco or illicit drugs  . Current medications and supplements . Functional ability and status . Nutritional status . Physical activity . Advanced directives . List of other physicians . Hospitalizations, surgeries, and ER visits in previous 12  months . Vitals . Screenings to include cognitive, depression, and falls . Referrals and appointments  In addition, I have reviewed and discussed with patient certain preventive protocols, quality metrics, and best practice recommendations. A written personalized care plan for preventive services as well as general preventive health recommendations were provided to patient.     Shaka Zech, RN  05/13/2017   I have read this note and agree with its contents.  Gershon Crane, MD

## 2017-05-13 NOTE — Patient Instructions (Addendum)
Rachael [name] , Thank you for taking time to come for your Medicare Wellness Visit. I appreciate your ongoing commitment to your health goals. Please review the following plan we discussed and let me know if I can assist you in the future.   Needs to have your eyes check  Have teeth cleaned for recommendation on dental care [facility]  [address]  [place], [place] [address]  Phone [phone]  [place]; [email or web address]/ Monthly drawing/ potential patients can call [phone] to have an application mailed to their residence within 10 days.   The Alzheimer's asso feels one needs at least 2 of these to have issues Ad8 score reviewed for issues:  Issues making decisions:  Less interest in hobbies / activities:  Repeats questions, stories (family complaining):  Trouble using ordinary gadgets (microwave, computer, phone):  Forgets the month or year:   Mismanaging finances:   Remembering appts: Daily problems with thinking and/or memory:  Shingrix is a vaccine for the prevention of Shingles in Adults 50 and older.  If you are on Medicare, you can request a prescription from your doctor to be filled at a pharmacy.  Please check with your benefits regarding applicable copays or out of pocket expenses.  The Shingrix is given in 2 vaccines approx 8 weeks apart. You must receive the 2nd dose prior to 6 months from receipt of the first.   These are the goals we discussed: Goals    . Patient Stated     To stay healthy and engaged   Go to the [employer] sneaker program        This is a list of the screening recommended for you and due dates:  Health Maintenance  Topic Date Due  . Flu Shot  12/24/2016  . DEXA scan (bone density measurement)  05/13/2018*  . Tetanus Vaccine  05/26/2018  .  Pneumonia vaccines  Completed  *Topic was postponed. The date shown is not the original due date.   Prevention of falls: Remove rugs or any tripping hazards in the home Use Non slip mats in bathtubs and showers Placing grab bars next to the toilet and or shower Placing handrails on both sides of the stair way Adding extra lighting in the home.   Personal safety issues reviewed:  1. Consider starting a community watch program per [facility] 2.  Changes batteries is smoke detector and/or carbon monoxide detector  3.  If you have firearms; keep them in a safe place 4.  Wear protection when in the sun; Always wear sunscreen or a hat; It is good to have your doctor check your skin annually or review any new areas of concern 5. Driving safety; Keep in the right lane; stay 3 car lengths behind the car in front of you on the highway; look 3 times prior to pulling out; carry your cell phone everywhere you go!    Learn about the Yellow Dot program:  The program allows first responders at your emergency to have access to who your physician is, as well as your medications and medical conditions.  Citizens requesting the Yellow Dot Packages should contact Master Corporal Nunzio Cobbs at the [facility] [phone] for the first week of the program and beginning the week after Easter citizens should contact their Scientist, physiological.       Fall Prevention in the Home Falls can cause  injuries. They can happen to people of all ages. There are many things you can do to make your home safe and to help prevent falls. What can I do on the outside of my home?  Regularly fix the edges of walkways and driveways and fix any cracks.  Remove anything that might make you trip as you walk through a door, such as a raised step or threshold.  Trim any bushes or trees on the path to your home.  Use bright outdoor lighting.  Clear any walking paths of anything that  might make someone trip, such as rocks or tools.  Regularly check to see if handrails are loose or broken. Make sure that both sides of any steps have handrails.  Any raised decks and porches should have guardrails on the edges.  Have any leaves, snow, or ice cleared regularly.  Use sand or salt on walking paths during winter.  Clean up any spills in your garage right away. This includes oil or grease spills. What can I do in the bathroom?  Use night lights.  Install grab bars by the toilet and in the tub and shower. Do not use towel bars as grab bars.  Use non-skid mats or decals in the tub or shower.  If you need to sit down in the shower, use a plastic, non-slip stool.  Keep the floor dry. Clean up any water that spills on the floor as soon as it happens.  Remove soap buildup in the tub or shower regularly.  Attach bath mats securely with double-sided non-slip rug tape.  Do not have throw rugs and other things on the floor that can make you trip. What can I do in the bedroom?  Use night lights.  Make sure that you have a light by your bed that is easy to reach.  Do not use any sheets or blankets that are too big for your bed. They should not hang down onto the floor.  Have a firm chair that has side arms. You can use this for support while you get dressed.  Do not have throw rugs and other things on the floor that can make you trip. What can I do in the kitchen?  Clean up any spills right away.  Avoid walking on wet floors.  Keep items that you use a lot in easy-to-reach places.  If you need to reach something above you, use a strong step stool that has a grab bar.  Keep electrical cords out of the way.  Do not use floor polish or wax that makes floors slippery. If you must use wax, use non-skid floor wax.  Do not have throw rugs and other things on the floor that can make you trip. What can I do with my stairs?  Do not leave any items on the stairs.  Make  sure that there are handrails on both sides of the stairs and use them. Fix handrails that are broken or loose. Make sure that handrails are as long as the stairways.  Check any carpeting to make sure that it is firmly attached to the stairs. Fix any carpet that is loose or worn.  Avoid having throw rugs at the top or bottom of the stairs. If you do have throw rugs, attach them to the floor with carpet tape.  Make sure that you have a light switch at the top of the stairs and the bottom of the stairs. If you do not have them, ask someone to add  them for you. What else can I do to help prevent falls?  Wear shoes that: ? Do not have high heels. ? Have rubber bottoms. ? Are comfortable and fit you well. ? Are closed at the toe. Do not wear sandals.  If you use a stepladder: ? Make sure that it is fully opened. Do not climb a closed stepladder. ? Make sure that both sides of the stepladder are locked into place. ? Ask someone to hold it for you, if possible.  Clearly mark and make sure that you can see: ? Any grab bars or handrails. ? First and last steps. ? Where the edge of each step is.  Use tools that help you move around (mobility aids) if they are needed. These include: ? Canes. ? Walkers. ? Scooters. ? Crutches.  Turn on the lights when you go into a dark area. Replace any light bulbs as soon as they burn out.  Set up your furniture so you have a clear path. Avoid moving your furniture around.  If any of your floors are uneven, fix them.  If there are any pets around you, be aware of where they are.  Review your medicines with your doctor. Some medicines can make you feel dizzy. This can increase your chance of falling. Ask your doctor what other things that you can do to help prevent falls. This information is not intended to replace advice given to you by your health care provider. Make sure you discuss any questions you have with your health care provider. Document  Released: 03/08/2009 Document Revised: 10/18/2015 Document Reviewed: 06/16/2014 Elsevier Interactive Patient Education  2018 [place] Maintenance, Female Adopting a healthy lifestyle and getting preventive care can go a long way to promote health and wellness. Talk with your health care provider about what schedule of regular examinations is right for you. This is a good chance for you to check in with your provider about disease prevention and staying healthy. In between checkups, there are plenty of things you can do on your own. Experts have done a lot of research about which lifestyle changes and preventive measures are most likely to keep you healthy. Ask your health care provider for more information. Weight and diet Eat a healthy diet  Be sure to include plenty of vegetables, fruits, low-fat dairy products, and lean protein.  Do not eat a lot of foods high in solid fats, added sugars, or salt.  Get regular exercise. This is one of the most important things you can do for your health. ? Most adults should exercise for at least 150 minutes each week. The exercise should increase your heart rate and make you sweat (moderate-intensity exercise). ? Most adults should also do strengthening exercises at least twice a week. This is in addition to the moderate-intensity exercise.  Maintain a healthy weight  Body mass index (BMI) is a measurement that can be used to identify possible weight problems. It estimates body fat based on height and weight. Your health care provider can help determine your BMI and help you achieve or maintain a healthy weight.  For females 25 years of age and older: ? A BMI below 18.5 is considered underweight. ? A BMI of 18.5 to 24.9 is normal. ? A BMI of 25 to 29.9 is considered overweight. ? A BMI of 30 and above is considered obese.  Watch levels of cholesterol and blood lipids  You should start having your blood tested for lipids and cholesterol  at 81 years of age, then have this test every 5 years.  You may need to have your cholesterol levels checked more often if: ? Your lipid or cholesterol levels are high. ? You are older than 81 years of age. ? You are at high risk for heart disease.  Cancer screening Lung Cancer  Lung cancer screening is recommended for adults 30-101 years old who are at high risk for lung cancer because of a history of smoking.  A yearly low-dose CT scan of the lungs is recommended for people who: ? Currently smoke. ? Have quit within the past 15 years. ? Have at least a 30-pack-year history of smoking. A pack year is smoking an average of one pack of cigarettes a day for 1 year.  Yearly screening should continue until it has been 15 years since you quit.  Yearly screening should stop if you develop a health problem that would prevent you from having lung cancer treatment.  Breast Cancer  Practice breast self-awareness. This means understanding how your breasts normally appear and feel.  It also means doing regular breast self-exams. Let your health care provider know about any changes, no matter how small.  If you are in your 20s or 30s, you should have a clinical breast exam (CBE) by a health care provider every 1-3 years as part of a regular health exam.  If you are 93 or older, have a CBE every year. Also consider having a breast X-ray (mammogram) every year.  If you have a family history of breast cancer, talk to your health care provider about genetic screening.  If you are at high risk for breast cancer, talk to your health care provider about having an MRI and a mammogram every year.  Breast cancer gene (BRCA) assessment is recommended for women who have family members with BRCA-related cancers. BRCA-related cancers include: ? Breast. ? Ovarian. ? Tubal. ? Peritoneal cancers.  Results of the assessment will determine the need for genetic counseling and BRCA1 and BRCA2  testing.  Cervical Cancer Your health care provider may recommend that you be screened regularly for cancer of the pelvic organs (ovaries, uterus, and vagina). This screening involves a pelvic examination, including checking for microscopic changes to the surface of your cervix (Pap test). You may be encouraged to have this screening done every 3 years, beginning at age 96.  For women ages 18-65, health care providers may recommend pelvic exams and Pap testing every 3 years, or they may recommend the Pap and pelvic exam, combined with testing for human papilloma virus (HPV), every 5 years. Some types of HPV increase your risk of cervical cancer. Testing for HPV may also be done on women of any age with unclear Pap test results.  Other health care providers may not recommend any screening for nonpregnant women who are considered low risk for pelvic cancer and who do not have symptoms. Ask your health care provider if a screening pelvic exam is right for you.  If you have had past treatment for cervical cancer or a condition that could lead to cancer, you need Pap tests and screening for cancer for at least 20 years after your treatment. If Pap tests have been discontinued, your risk factors (such as having a new sexual partner) need to be reassessed to determine if screening should resume. Some women have medical problems that increase the chance of getting cervical cancer. In these cases, your health care provider may recommend more frequent screening and Pap  tests.  Colorectal Cancer  This type of cancer can be detected and often prevented.  Routine colorectal cancer screening usually begins at 81 years of age and continues through 81 years of age.  Your health care provider may recommend screening at an earlier age if you have risk factors for colon cancer.  Your health care provider may also recommend using home test kits to check for hidden blood in the stool.  A small camera at the end of a  tube can be used to examine your colon directly (sigmoidoscopy or colonoscopy). This is done to check for the earliest forms of colorectal cancer.  Routine screening usually begins at age 44.  Direct examination of the colon should be repeated every 5-10 years through 81 years of age. However, you may need to be screened more often if early forms of precancerous polyps or small growths are found.  Skin Cancer  Check your skin from head to toe regularly.  Tell your health care provider about any new moles or changes in moles, especially if there is a change in a mole's shape or color.  Also tell your health care provider if you have a mole that is larger than the size of a pencil eraser.  Always use sunscreen. Apply sunscreen liberally and repeatedly throughout the day.  Protect yourself by wearing long sleeves, pants, a wide-brimmed hat, and sunglasses whenever you are outside.  Heart disease, diabetes, and high blood pressure  High blood pressure causes heart disease and increases the risk of stroke. High blood pressure is more likely to develop in: ? People who have blood pressure in the high end of the normal range (130-139/85-89 mm Hg). ? People who are overweight or obese. ? People who are African American.  If you are 39-9 years of age, have your blood pressure checked every 3-5 years. If you are 97 years of age or older, have your blood pressure checked every year. You should have your blood pressure measured twice-once when you are at a hospital or clinic, and once when you are not at a hospital or clinic. Record the average of the two measurements. To check your blood pressure when you are not at a hospital or clinic, you can use: ? An automated blood pressure machine at a pharmacy. ? A home blood pressure monitor.  If you are between 20 years and 57 years old, ask your health care provider if you should take aspirin to prevent strokes.  Have regular diabetes screenings. This  involves taking a blood sample to check your fasting blood sugar level. ? If you are at a normal weight and have a low risk for diabetes, have this test once every three years after 81 years of age. ? If you are overweight and have a high risk for diabetes, consider being tested at a younger age or more often. Preventing infection Hepatitis B  If you have a higher risk for hepatitis B, you should be screened for this virus. You are considered at high risk for hepatitis B if: ? You were born in a country where hepatitis B is common. Ask your health care provider which countries are considered high risk. ? Your parents were born in a high-risk country, and you have not been immunized against hepatitis B (hepatitis B vaccine). ? You have HIV or AIDS. ? You use needles to inject street drugs. ? You live with someone who has hepatitis B. ? You have had sex with someone who has hepatitis  B. ? You get hemodialysis treatment. ? You take certain medicines for conditions, including cancer, organ transplantation, and autoimmune conditions.  Hepatitis C  Blood testing is recommended for: ? Everyone born from [birth year] through 1963. ? Anyone with known risk factors for hepatitis C.  Sexually transmitted infections (STIs)  You should be screened for sexually transmitted infections (STIs) including gonorrhea and chlamydia if: ? You are sexually active and are younger than 81 years of age. ? You are older than 81 years of age and your health care provider tells you that you are at risk for this type of infection. ? Your sexual activity has changed since you were last screened and you are at an increased risk for chlamydia or gonorrhea. Ask your health care provider if you are at risk.  If you do not have HIV, but are at risk, it may be recommended that you take a prescription medicine daily to prevent HIV infection. This is called pre-exposure prophylaxis (PrEP). You are considered at risk if: ? You are  sexually active and do not regularly use condoms or know the HIV status of your partner(s). ? You take drugs by injection. ? You are sexually active with a partner who has HIV.  Talk with your health care provider about whether you are at high risk of being infected with HIV. If you choose to begin PrEP, you should first be tested for HIV. You should then be tested every 3 months for as long as you are taking PrEP. Pregnancy  If you are premenopausal and you may become pregnant, ask your health care provider about preconception counseling.  If you may become pregnant, take 400 to 800 micrograms (mcg) of folic acid every day.  If you want to prevent pregnancy, talk to your health care provider about birth control (contraception). Osteoporosis and menopause  Osteoporosis is a disease in which the bones lose minerals and strength with aging. This can result in serious bone fractures. Your risk for osteoporosis can be identified using a bone density scan.  If you are 86 years of age or older, or if you are at risk for osteoporosis and fractures, ask your health care provider if you should be screened.  Ask your health care provider whether you should take a calcium or vitamin D supplement to lower your risk for osteoporosis.  Menopause may have certain physical symptoms and risks.  Hormone replacement therapy may reduce some of these symptoms and risks. Talk to your health care provider about whether hormone replacement therapy is right for you. Follow these instructions at home:  Schedule regular health, dental, and eye exams.  Stay current with your immunizations.  Do not use any tobacco products including cigarettes, chewing tobacco, or electronic cigarettes.  If you are pregnant, do not drink alcohol.  If you are breastfeeding, limit how much and how often you drink alcohol.  Limit alcohol intake to no more than 1 drink per day for nonpregnant women. One drink equals 12 ounces of  beer, 5 ounces of wine, or 1 ounces of hard liquor.  Do not use street drugs.  Do not share needles.  Ask your health care provider for help if you need support or information about quitting drugs.  Tell your health care provider if you often feel depressed.  Tell your health care provider if you have ever been abused or do not feel safe at home. This information is not intended to replace advice given to you by your health care provider.  Make sure you discuss any questions you have with your health care provider. Document Released: 11/25/2010 Document Revised: 10/18/2015 Document Reviewed: 02/13/2015 Elsevier Interactive Patient Education  [employer].

## 2017-05-13 NOTE — Progress Notes (Signed)
   Subjective:    Patient ID: Rachael [name], female    DOB: 01/01/1931, 83 y.o.   MRN: [id number]  HPI Here to follow up. She feels well. She is fasting and asks for lab work to be done. Her GERD is controlled and her BP is stable.    Review of Systems  Constitutional: Negative.   Respiratory: Negative.   Cardiovascular: Negative.   Neurological: Negative.        Objective:   Physical Exam  Constitutional: She is oriented to person, place, and time. She appears well-developed and well-nourished.  Cardiovascular: Normal rate, regular rhythm, normal heart sounds and intact distal pulses.  Pulmonary/Chest: Effort normal and breath sounds normal. No respiratory distress. She has no wheezes. She has no rales.  Neurological: She is alert and oriented to person, place, and time.          Assessment & Plan:  hse is doing well with stable HTN and GERD. We will get fasting labs today.  Gershon CraneStephen Malyk Girouard, MD

## 2017-06-01 ENCOUNTER — Other Ambulatory Visit: Payer: Self-pay | Admitting: Cardiology

## 2017-07-06 ENCOUNTER — Other Ambulatory Visit: Payer: Self-pay | Admitting: Family Medicine

## 2017-07-06 DIAGNOSIS — I6523 Occlusion and stenosis of bilateral carotid arteries: Secondary | ICD-10-CM

## 2017-07-09 ENCOUNTER — Ambulatory Visit (HOSPITAL_COMMUNITY)
Admission: RE | Admit: 2017-07-09 | Discharge: 2017-07-09 | Disposition: A | Discharge status: Home / Self Care | Payer: Medicare Other | Source: Ambulatory Visit | Attending: Cardiology | Admitting: Cardiology

## 2017-07-09 DIAGNOSIS — I6523 Occlusion and stenosis of bilateral carotid arteries: Secondary | ICD-10-CM | POA: Insufficient documentation

## 2017-07-11 ENCOUNTER — Emergency Department (HOSPITAL_COMMUNITY)
Admission: EM | Admit: 2017-07-11 | Discharge: 2017-07-12 | Disposition: A | Discharge status: Home / Self Care | Payer: Medicare Other | Attending: Emergency Medicine | Admitting: Emergency Medicine

## 2017-07-11 ENCOUNTER — Emergency Department (HOSPITAL_COMMUNITY): Payer: Medicare Other

## 2017-07-11 ENCOUNTER — Other Ambulatory Visit: Payer: Self-pay

## 2017-07-11 ENCOUNTER — Encounter (HOSPITAL_COMMUNITY): Payer: Self-pay | Admitting: *Deleted

## 2017-07-11 DIAGNOSIS — M7989 Other specified soft tissue disorders: Secondary | ICD-10-CM

## 2017-07-11 DIAGNOSIS — R600 Localized edema: Secondary | ICD-10-CM | POA: Diagnosis not present

## 2017-07-11 DIAGNOSIS — Z8673 Personal history of transient ischemic attack (TIA), and cerebral infarction without residual deficits: Secondary | ICD-10-CM | POA: Diagnosis not present

## 2017-07-11 DIAGNOSIS — E039 Hypothyroidism, unspecified: Secondary | ICD-10-CM | POA: Insufficient documentation

## 2017-07-11 DIAGNOSIS — R0602 Shortness of breath: Secondary | ICD-10-CM | POA: Insufficient documentation

## 2017-07-11 DIAGNOSIS — Z79899 Other long term (current) drug therapy: Secondary | ICD-10-CM | POA: Insufficient documentation

## 2017-07-11 DIAGNOSIS — Z96651 Presence of right artificial knee joint: Secondary | ICD-10-CM | POA: Diagnosis not present

## 2017-07-11 DIAGNOSIS — R609 Edema, unspecified: Secondary | ICD-10-CM | POA: Diagnosis not present

## 2017-07-11 DIAGNOSIS — I2510 Atherosclerotic heart disease of native coronary artery without angina pectoris: Secondary | ICD-10-CM | POA: Insufficient documentation

## 2017-07-11 LAB — CBC
HEMATOCRIT: 41 % (ref 36.0–46.0)
Hemoglobin: 13.4 g/dL (ref 12.0–15.0)
MCH: 29.9 pg (ref 26.0–34.0)
MCHC: 32.7 g/dL (ref 30.0–36.0)
MCV: 91.5 fL (ref 78.0–100.0)
Platelets: 257 10*3/uL (ref 150–400)
RBC: 4.48 MIL/uL (ref 3.87–5.11)
RDW: 14.7 % (ref 11.5–15.5)
WBC: 9.3 10*3/uL (ref 4.0–10.5)

## 2017-07-11 LAB — BASIC METABOLIC PANEL
Anion gap: 10 (ref 5–15)
BUN: 19 mg/dL (ref 6–20)
CO2: 23 mmol/L (ref 22–32)
Calcium: 9.2 mg/dL (ref 8.9–10.3)
Chloride: 110 mmol/L (ref 101–111)
Creatinine, Ser: 1.06 mg/dL — ABNORMAL HIGH (ref 0.44–1.00)
GFR calc Af Amer: 54 mL/min — ABNORMAL LOW (ref >60)
GFR calc non Af Amer: 47 mL/min — ABNORMAL LOW (ref >60)
GLUCOSE: 100 mg/dL — AB (ref 65–99)
POTASSIUM: 3.6 mmol/L (ref 3.5–5.1)
Sodium: 143 mmol/L (ref 135–145)

## 2017-07-11 LAB — BRAIN NATRIURETIC PEPTIDE: B Natriuretic Peptide: 511.5 pg/mL — ABNORMAL HIGH (ref 0.0–100.0)

## 2017-07-11 LAB — I-STAT TROPONIN, ED: Troponin i, poc: 0.01 ng/mL (ref 0.00–0.08)

## 2017-07-11 NOTE — ED Triage Notes (Signed)
Pt reports having leg/ankle swelling for several days. Went to [facility] and sent here due possible chf due to swelling and sob.

## 2017-07-12 MED ORDER — FUROSEMIDE 20 MG PO TABS
20.0000 mg | ORAL_TABLET | Freq: Every day | ORAL | Status: DC
  Administered 2017-07-12: 20 mg via ORAL
  Filled 2017-07-12: qty 1

## 2017-07-12 MED ORDER — POTASSIUM CHLORIDE CRYS ER 20 MEQ PO TBCR: 20.0000 meq | EXTENDED_RELEASE_TABLET | Freq: Two times a day (BID) | ORAL | 0 refills | Status: DC

## 2017-07-12 MED ORDER — POTASSIUM CHLORIDE CRYS ER 20 MEQ PO TBCR
20.0000 meq | EXTENDED_RELEASE_TABLET | Freq: Two times a day (BID) | ORAL | Status: DC
  Administered 2017-07-12: 20 meq via ORAL
  Filled 2017-07-12: qty 1

## 2017-07-12 MED ORDER — FUROSEMIDE 20 MG PO TABS: 20.0000 mg | ORAL_TABLET | Freq: Every day | ORAL | 0 refills | Status: DC

## 2017-07-12 NOTE — ED Provider Notes (Signed)
[facility] Provider Note   CSN: [id number] Arrival date & time: 07/11/17  1721     History   Chief Complaint Chief Complaint  Patient presents with  . Shortness of Breath  . Leg Swelling    HPI Rachael [name] is a 84 y.o. female.  Chief complaint is leg swelling.  HPI 84 year old female.  History of coronary artery disease.  No history of CHF.  Has had some swelling in her legs for the last several days.  States she feels "a little short of breath".  No PND orthopnea.  No chest pain or palpitations.  Has gained 10 pounds over the last few weeks.  Past Medical History:  Diagnosis Date  . Anemia   . Anxiety   . Arthritis   . CAD (coronary artery disease)    sees Dr. Antoine PocheHochrein, normal Myoview stress test 02-26-12   . Cataract    bilateral, per Dr. Mckinley JewelSara Stoneburner   . Depression   . Diverticulitis of colon   . GERD (gastroesophageal reflux disease)   . Gout    has seen Dr. Alben DeedsJames Beekman  . H/O hiatal hernia   . Hyperglycemia   . Hypothyroidism   . Lightheadedness   . Osteoarthritis   . Right knee DJD   . Shortness of breath    with anxiety attacks  . Supraventricular tachycardia (HCC) 04/07/12   In Dr Sherene SiresWainer's office - wore heart monitor for 3 weeks   . TIA (transient ischemic attack)    11/15    Patient Active Problem List   Diagnosis Date Noted  . Ptosis of both eyelids 03/27/2016  . Acute ischemic stroke (HCC)   . Cerebrovascular accident (stroke) (HCC) 04/18/2014  . S/P Nissen fundoplication April 2012 10/01/2012  . CAD (coronary artery disease)   . Arthritis   . Gout   . Right knee DJD   . Supraventricular tachycardia (HCC) 04/07/2012  . ANGINA, HX OF 07/30/2010  . ANKLE PAIN, RIGHT 05/01/2010  . BUNION, LEFT FOOT 08/23/2009  . DIVERTICULITIS OF COLON 08/21/2009  . ALLERGIC RHINITIS 08/15/2008  . HYPERGLYCEMIA 06/21/2008  . OSTEOARTHRITIS 11/25/2007  . Hypothyroidism 04/27/2007  . Dyslipidemia 04/27/2007   . ANEMIA-NOS 04/27/2007  . DEPRESSION 04/27/2007  . Coronary atherosclerosis 04/27/2007  . GERD 04/27/2007  . LOW BACK PAIN, CHRONIC 04/27/2007  . FATIGUE 04/27/2007    Past Surgical History:  Procedure Laterality Date  . ABDOMINAL HYSTERECTOMY    . BREATH TEK H PYLORI N/A 11/28/2013   Procedure: BREATH TEK H PYLORI;  Surgeon: Valarie MerinoMatthew B Martin, MD;  [facility];  Service: General;  Laterality: N/A;  . CATARACT EXTRACTION, BILATERAL  2016   per Dr. Dagoberto LigasStoneburner   . COLON SURGERY    . COLONOSCOPY  10-10-05   per Dr. Juanda ChanceBrodie, repeat in 10 yrs   . ESOPHAGOGASTRODUODENOSCOPY N/A 08/29/2013   Procedure: ESOPHAGOGASTRODUODENOSCOPY (EGD);  Surgeon: Kandis Cockingavid H Newman, MD;  [facility];  Service: General;  Laterality: N/A;  . HERNIA REPAIR    . HIATAL HERNIA REPAIR  09-21-10   per Dr. Luretha MurphyMatthew Martin, lap [employer]   . PARTIAL COLECTOMY     for adhesions  . TOTAL KNEE ARTHROPLASTY  06/07/2012   Procedure: TOTAL KNEE ARTHROPLASTY;  Surgeon: Nilda Simmerobert A Wainer, MD;  Location: [facility] OR;  Service: Orthopedics;  Laterality: Right;    OB History    No data available       Home Medications    Prior to Admission medications  Medication Sig Start Date End Date Taking? Authorizing Provider  allopurinol (ZYLOPRIM) 100 MG tablet TAKE ONE TABLET BY MOUTH ONCE DAILY 02/13/17   Nelwyn Salisbury, MD  furosemide (LASIX) 20 MG tablet Take 1 tablet (20 mg total) by mouth daily. 07/12/17   Rolland Porter, MD  ibuprofen (ADVIL,MOTRIN) 800 MG tablet Take 1 tablet (800 mg total) by mouth every 8 (eight) hours as needed. 01/06/17   Nelwyn Salisbury, MD  levothyroxine (SYNTHROID, LEVOTHROID) 100 MCG tablet TAKE ONE TABLET BY MOUTH DAILY BEFORE BREAKFAST 06/17/16   Nelwyn Salisbury, MD  metoprolol succinate (TOPROL-XL) 50 MG 24 hr tablet Take 1 tablet (50 mg total) by mouth daily. NEED OV. 06/01/17   Rollene Rotunda, MD  nitroGLYCERIN (NITROSTAT) 0.3 MG SL tablet Place 1 tablet (0.3 mg total) under the tongue every 5  (five) minutes as needed for chest pain. 03/27/16   Nelwyn Salisbury, MD  omeprazole (PRILOSEC) 20 MG capsule TAKE 1 CAPSULE BY MOUTH TWICE DAILY BEFORE  A  MEAL 02/12/17   Armbruster, Willaim Rayas, MD  potassium chloride SA (K-DUR,KLOR-CON) 20 MEQ tablet Take 1 tablet (20 mEq total) by mouth 2 (two) times daily. 07/12/17   Rolland Porter, MD    Family History Family History  Problem Relation Age of Onset  . Diabetes Mellitus II Unknown   . Heart disease Unknown   . Cervical cancer Daughter     Social History Social History   Tobacco Use  . Smoking status: Never Smoker  . Smokeless tobacco: Never Used  Substance Use Topics  . Alcohol use: No    Alcohol/week: 0.0 oz  . Drug use: No     Allergies   Demerol [meperidine]; Meperidine hcl; and Sulfonamide derivatives   Review of Systems Review of Systems  Constitutional: Positive for unexpected weight change. Negative for appetite change, chills, diaphoresis, fatigue and fever.  HENT: Negative for mouth sores, sore throat and trouble swallowing.   Eyes: Negative for visual disturbance.  Respiratory: Positive for shortness of breath. Negative for cough, chest tightness and wheezing.   Cardiovascular: Positive for leg swelling. Negative for chest pain.  Gastrointestinal: Negative for abdominal distention, abdominal pain, diarrhea, nausea and vomiting.  Endocrine: Negative for polydipsia, polyphagia and polyuria.  Genitourinary: Negative for dysuria, frequency and hematuria.  Musculoskeletal: Negative for gait problem.  Skin: Negative for color change, pallor and rash.  Neurological: Negative for dizziness, syncope, light-headedness and headaches.  Hematological: Does not bruise/bleed easily.  Psychiatric/Behavioral: Negative for behavioral problems and confusion.     Physical Exam Updated Vital Signs BP 135/67   Pulse (!) 57   Temp 98.4 F (36.9 C) (Oral)   Resp 20   SpO2 95%   Physical Exam  Constitutional: She is oriented to  person, place, and time. She appears well-developed and well-nourished. No distress.  HENT:  Head: Normocephalic.  Eyes: Conjunctivae are normal. Pupils are equal, round, and reactive to light. No scleral icterus.  Neck: Normal range of motion. Neck supple. No thyromegaly present.  Cardiovascular: Normal rate and regular rhythm. Exam reveals no gallop and no friction rub.  No murmur heard. 1+ symmetric bilateral lower extremity edema.  No tenderness, guarding, pain, or swelling.  Pulmonary/Chest: Effort normal and breath sounds normal. No respiratory distress. She has no wheezes. She has no rales.  Clear bilateral breath sounds.  No JVD.  No gallop.  Abdominal: Soft. Bowel sounds are normal. She exhibits no distension. There is no tenderness. There is no rebound.  Musculoskeletal:  Normal range of motion.  Neurological: She is alert and oriented to person, place, and time.  Skin: Skin is warm and dry. No rash noted.  Psychiatric: She has a normal mood and affect. Her behavior is normal.     ED Treatments / Results  Labs (all labs ordered are listed, but only abnormal results are displayed) Labs Reviewed  BASIC METABOLIC PANEL - Abnormal; Notable for the following components:      Result Value   Glucose, Bld 100 (*)    Creatinine, Ser 1.06 (*)    GFR calc non Af Amer 47 (*)    GFR calc Af Amer 54 (*)    All other components within normal limits  BRAIN NATRIURETIC PEPTIDE - Abnormal; Notable for the following components:   B Natriuretic Peptide 511.5 (*)    All other components within normal limits  CBC  I-STAT TROPONIN, ED    EKG  EKG Interpretation None       Radiology Dg Chest 2 View  Result Date: 07/11/2017 CLINICAL DATA:  84 y/o  F; shortness of breath, possible CHF. EXAM: CHEST  2 VIEW COMPARISON:  03/31/2014 chest radiograph.  02/07/2015 chest CT. FINDINGS: Enlarged cardiac silhouette. Moderate hiatal hernia. Aortic atherosclerosis with calcification. Stable right  upper lobe calcified granuloma. Left lung base linear opacities, likely scarring or atelectasis. No consolidation. No pleural effusion or pneumothorax. No acute osseous abnormality is evident. IMPRESSION: Cardiomegaly. Moderate hiatal hernia. Left lung base scarring/atelectasis. No consolidation or edema identified. Electronically Signed   By: Mitzi Hansen M.D.   On: 07/11/2017 19:47    Procedures Procedures (including critical care time)  Medications Ordered in ED Medications  furosemide (LASIX) tablet 20 mg (not administered)  potassium chloride SA (K-DUR,KLOR-CON) CR tablet 20 mEq (not administered)     Initial Impression / Assessment and Plan / ED Course  I have reviewed the triage vital signs and the nursing notes.  Pertinent labs & imaging results that were available during my care of the patient were reviewed by me and considered in my medical decision making (see chart for details).  EKG sinus rhythm.  No acute or ischemic changes.  No morphology change versus comparison.  Chest x-ray shows no CHF.  Mild cardiomegaly.  BNP 511.  Creatinine 1.06.  Hemoglobin 13.4.  I think she is appropriate for diuresis.  Potassium replacement.  Cartilage he follow-up for repeat ultrasound.  Her echo in 2015 showed EF of 60.  She is [place] by her daughters.  No concerns about their part.  They will be with her until her follow-up appointment hopefully this week with cardiology.  I have asked her to weigh yourself daily.  Final Clinical Impressions(s) / ED Diagnoses   Final diagnoses:  Leg swelling    ED Discharge Orders        Ordered    potassium chloride SA (K-DUR,KLOR-CON) 20 MEQ tablet  2 times daily     07/12/17 0022    furosemide (LASIX) 20 MG tablet  Daily     07/12/17 Hollice Espy, MD 07/12/17 0030

## 2017-07-12 NOTE — Discharge Instructions (Signed)
Lasix, and potassium each morning. Weigh yourself daily Call Dr. Antoine PocheHochrein appointment this week.

## 2017-07-13 ENCOUNTER — Ambulatory Visit: Payer: Medicare Other | Admitting: Physician Assistant

## 2017-07-13 ENCOUNTER — Encounter: Payer: Self-pay | Admitting: Physician Assistant

## 2017-07-13 VITALS — BP 122/81 | HR 72 | Ht 63.0 in | Wt 162.4 lb

## 2017-07-13 DIAGNOSIS — I509 Heart failure, unspecified: Secondary | ICD-10-CM

## 2017-07-13 MED ORDER — FUROSEMIDE 20 MG PO TABS: 20.0000 mg | ORAL_TABLET | Freq: Every day | ORAL | 11 refills | Status: AC

## 2017-07-13 MED ORDER — POTASSIUM CHLORIDE ER 20 MEQ PO TBCR: 20.0000 meq | EXTENDED_RELEASE_TABLET | ORAL | 11 refills | Status: DC

## 2017-07-13 MED ORDER — POTASSIUM CHLORIDE ER 20 MEQ PO TBCR: 20.0000 meq | EXTENDED_RELEASE_TABLET | Freq: Every day | ORAL | 11 refills | Status: AC

## 2017-07-13 MED ORDER — POTASSIUM CHLORIDE ER 10 MEQ PO TBCR: 10.0000 meq | EXTENDED_RELEASE_TABLET | ORAL | 11 refills | Status: DC

## 2017-07-13 NOTE — Patient Instructions (Addendum)
Medication Instructions: DECREASE potassium 20 meq daily.  If you need a refill on your cardiac medications before your next appointment, please call your pharmacy.   Procedures/Testing: Your physician has requested that you have an echocardiogram. Echocardiography is a painless test that uses sound waves to create images of your heart. It provides your doctor with information about the size and shape of your heart and how well your heart's chambers and valves are working. This procedure takes approximately one hour. There are no restrictions for this procedure. This will take place at [address], [address].  Follow-Up: Your physician wants you to follow-up  in 2 weeks after your echo has been completed. You may follow up with Dr. Antoine PocheHochrein or Theodore Demarkhonda Barrett, PA.    Special Instructions:  Please limit your sodium to 500 mg per meal daily Please limit your fluid intake to 1.5 quart, mostly water Check your weight daily, first thing in the morning. Keep a log of these weights. If you are dizzy when you stand up, please call us at [phone]   Thank you for choosing [facility]!!

## 2017-07-13 NOTE — Progress Notes (Signed)
Cardiology Office Note   Date:  07/13/2017   ID:  Rachael [name], DOB February 27, 1931, MRN [id number]  PCP:  Nelwyn Salisbury, MD  Cardiologist:  Dr Antoine Poche, 05/01/2016  Theodore Demark, PA-C   Chief Complaint  Patient presents with  . Follow-up    hospital visit   . Edema    bilateral legs and feet   . Shortness of Breath    History of Present Illness: Rachael [name] is a 84 y.o. female with a history of TIA, mod CAD at cath 2002, cataracts, GERD, hypothyroid, OA, neg MV 2013, palpitations/SVT.  07/11/2017 ER visit for lower extremity edema, weight up 10 pounds, BNP 511, cards follow-up arranged  Rachael [name] presents for cardiology follow up and evaluation.   Her weight was 159 per her reports when she came to get her Carotid Dopplers 02/14, was 168 when she went to Urgent Care on 07/11/2017. She went to the UC that day because her feet were swollen. She feels sure her feet were not swollen the day before.   She has been having episodic SOB, brief. Denies increased DOE. She feels the sx occur at rest, denies PND. Sleeps on 1 pillow.   Eats a high-salt diet. Has cut back a little since she had the swelling. Rachael [name] foods. Has cut back a little on the butter, it has upset her stomach.   She lives alone, still drives. Has a daughter that lives locally, does not get along with her, that daughter does not help much in her care.   Another daughter is with her today, but she lives in [place]. Mom has refused to move there.  Pt had some trouble remembering her medications, admits she has trouble remembering to take the evening meds at times. Does not have weekly pill boxes.    Past Medical History:  Diagnosis Date  . Anemia   . Anxiety   . Arthritis   . CAD (coronary artery disease)    sees Dr. Antoine Poche, normal Myoview stress test 02-26-12   . Cataract    bilateral, per Dr. Mckinley Jewel   . Depression   . Diverticulitis of colon   . GERD (gastroesophageal  reflux disease)   . Gout    has seen Dr. Alben Deeds  . H/O hiatal hernia   . Hyperglycemia   . Hypothyroidism   . Lightheadedness   . Osteoarthritis   . Right knee DJD   . Shortness of breath    with anxiety attacks  . Supraventricular tachycardia (HCC) 04/07/12   In Dr Sherene Sires office - wore heart monitor for 3 weeks   . TIA (transient ischemic attack)    11/15    Past Surgical History:  Procedure Laterality Date  . ABDOMINAL HYSTERECTOMY    . BREATH TEK H PYLORI N/A 11/28/2013   Procedure: BREATH TEK H PYLORI;  Surgeon: Valarie Merino, MD;  Location: [facility];  Service: General;  Laterality: N/A;  . CATARACT EXTRACTION, BILATERAL  2016   per Dr. Dagoberto Ligas   . COLON SURGERY    . COLONOSCOPY  10-10-05   per Dr. Juanda Chance, repeat in 10 yrs   . ESOPHAGOGASTRODUODENOSCOPY N/A 08/29/2013   Procedure: ESOPHAGOGASTRODUODENOSCOPY (EGD);  Surgeon: Kandis Cocking, MD;  Location: [facility];  Service: General;  Laterality: N/A;  . HERNIA REPAIR    . HIATAL HERNIA REPAIR  09-21-10   per Dr. Luretha Murphy, lap [employer]   . PARTIAL COLECTOMY     for  adhesions  . TOTAL KNEE ARTHROPLASTY  06/07/2012   Procedure: TOTAL KNEE ARTHROPLASTY;  Surgeon: Nilda Simmer, MD;  Location: [facility] OR;  Service: Orthopedics;  Laterality: Right;    Current Outpatient Medications  Medication Sig Dispense Refill  . allopurinol (ZYLOPRIM) 100 MG tablet TAKE ONE TABLET BY MOUTH ONCE DAILY 90 tablet 1  . furosemide (LASIX) 20 MG tablet Take 1 tablet (20 mg total) by mouth daily. 30 tablet 0  . ibuprofen (ADVIL,MOTRIN) 800 MG tablet Take 1 tablet (800 mg total) by mouth every 8 (eight) hours as needed. 60 tablet 5  . levothyroxine (SYNTHROID, LEVOTHROID) 100 MCG tablet TAKE ONE TABLET BY MOUTH DAILY BEFORE BREAKFAST 90 tablet 3  . metoprolol succinate (TOPROL-XL) 50 MG 24 hr tablet Take 1 tablet (50 mg total) by mouth daily. NEED OV. 90 tablet 0  . nitroGLYCERIN (NITROSTAT) 0.3 MG SL tablet Place 1 tablet  (0.3 mg total) under the tongue every 5 (five) minutes as needed for chest pain. 25 tablet 5  . omeprazole (PRILOSEC) 20 MG capsule TAKE 1 CAPSULE BY MOUTH TWICE DAILY BEFORE  A  MEAL 180 capsule 1  . potassium chloride SA (K-DUR,KLOR-CON) 20 MEQ tablet Take 1 tablet (20 mEq total) by mouth 2 (two) times daily. 30 tablet 0   No current facility-administered medications for this visit.     Allergies:   Demerol [meperidine]; Meperidine hcl; and Sulfonamide derivatives    Social History:  The patient  reports that  has never smoked. she has never used smokeless tobacco. She reports that she does not drink alcohol or use drugs.   Family History:  The patient's family history includes Cervical cancer in her daughter; Diabetes Mellitus II in her unknown relative; Heart disease in her unknown relative.    ROS:  Please see the history of present illness. All other systems are reviewed and negative.    PHYSICAL EXAM: VS:  BP 122/81   Pulse 72   Ht 5\' 3"  (1.6 m)   Wt 162 lb 6.4 oz (73.7 kg)   SpO2 90%   BMI 28.77 kg/m  , BMI Body mass index is 28.77 kg/m. GEN: Well nourished, well developed, female in no acute distress  HEENT: normal for age  Neck: JVD 9-10 cm, no carotid bruit, no masses Cardiac: RRR; soft murmur, no rubs, or gallops Respiratory: decreased BS bases bilaterally, normal work of breathing GI: soft, nontender, nondistended, + BS MS: no deformity or atrophy; trace pedal edema; distal pulses are 2+ in all 4 extremities   Skin: warm and dry, no rash Neuro:  Strength and sensation are intact Psych: euthymic mood, full affect   EKG:  EKG is not ordered today. The ER ECG from 07/11/2017 is reviewed and shows sinus rhythm with PR 102 ms (previous PR interval 122 ms) and no significant morphology change from 05/01/2016  CAROTID DOPPLERS 07/09/2017 Final Interpretation: Right Carotid: Velocities in the right ICA are consistent with a 1-39% stenosis. Left Carotid: Velocities in  the left ICA are consistent with a 1-39% stenosis. Vertebrals: Both vertebral arteries were patent with antegrade flow. Subclavians: Normal flow hemodynamics were seen in bilateral subclavian       arteries. *See table(s) above for measurements and observations.  MYOVIEW: 03/29/2015  The left ventricular ejection fraction is normal (55-65%).  Nuclear stress EF: 59%.  There was no ST segment deviation noted during stress.  The study is normal.  This is a low risk study.  Normal myocardial perfusion.  Normal myocardial  perfusion and function: EF 59% with normal wall motion.    Recent Labs: 05/13/2017: ALT 10; TSH 3.45 07/11/2017: B Natriuretic Peptide 511.5; BUN 19; Creatinine, Ser 1.06; Hemoglobin 13.4; Platelets 257; Potassium 3.6; Sodium 143    Lipid Panel    Component Value Date/Time   CHOL 215 (H) 05/13/2017 1502   TRIG 133.0 05/13/2017 1502   HDL 48.80 05/13/2017 1502   CHOLHDL 4 05/13/2017 1502   VLDL 26.6 05/13/2017 1502   LDLCALC 140 (H) 05/13/2017 1502   LDLDIRECT 136.4 06/19/2011 1215     Wt Readings from Last 3 Encounters:  07/13/17 162 lb 6.4 oz (73.7 kg)  05/13/17 164 lb (74.4 kg)  05/13/17 163 lb (73.9 kg)     Other studies Reviewed: Additional studies/ records that were reviewed today include: Office notes, hospital records and testing.  ASSESSMENT AND PLAN:  1.  Acute CHF: Type unclear, an echocardiogram is ordered to further define this.  She needs additional diuresis, continue Lasix 20 mg a day.  She is encouraged to start daily weights and record them.  Limit sodium to 500 mg per meal and fluids to 1.5 L daily.  Follow-up in 1-2 weeks with a BMET.  No labs today because she had labs done in the emergency room 2 days ago.  Potassium was 20 mEq twice daily, will decrease that to 20 mEq daily since she is only on Lasix 20 mg once a day  At her follow-up office visit, if she does not seem to comprehend instructions, she may need a home health  RN to help with this.   Current medicines are reviewed at length with the patient today.  The patient does not have concerns regarding medicines.  The following changes have been made: Decrease potassium  Labs/ tests ordered today include:   Orders Placed This Encounter  Procedures  . ECHOCARDIOGRAM COMPLETE     Disposition:   FU with Dr. Antoine PocheHochrein  Signed, Theodore Demarkhonda Dayon Witt, PA-C  07/13/2017 4:51 PM     [facility] Phone: [phone]; Fax: [phone]  This note was written with the assistance of speech recognition software. Please excuse any transcriptional errors.

## 2017-07-14 ENCOUNTER — Other Ambulatory Visit: Payer: Self-pay

## 2017-07-14 ENCOUNTER — Ambulatory Visit (HOSPITAL_COMMUNITY): Payer: Medicare Other | Attending: Cardiology

## 2017-07-14 DIAGNOSIS — I509 Heart failure, unspecified: Secondary | ICD-10-CM | POA: Diagnosis not present

## 2017-07-14 DIAGNOSIS — I351 Nonrheumatic aortic (valve) insufficiency: Secondary | ICD-10-CM | POA: Diagnosis not present

## 2017-07-14 DIAGNOSIS — R002 Palpitations: Secondary | ICD-10-CM | POA: Insufficient documentation

## 2017-07-14 DIAGNOSIS — I471 Supraventricular tachycardia: Secondary | ICD-10-CM | POA: Diagnosis not present

## 2017-07-14 DIAGNOSIS — G459 Transient cerebral ischemic attack, unspecified: Secondary | ICD-10-CM | POA: Insufficient documentation

## 2017-07-14 DIAGNOSIS — I2510 Atherosclerotic heart disease of native coronary artery without angina pectoris: Secondary | ICD-10-CM | POA: Insufficient documentation

## 2017-07-14 DIAGNOSIS — Z8249 Family history of ischemic heart disease and other diseases of the circulatory system: Secondary | ICD-10-CM | POA: Insufficient documentation

## 2017-07-15 ENCOUNTER — Telehealth: Payer: Self-pay | Admitting: Cardiology

## 2017-07-15 NOTE — Telephone Encounter (Signed)
Returned call to patient who states she is returning a call to [place]ound[place]onda, [place]. Explained that Bjorn LoserRhonda is not in the office today, nor is Rhae Hammockee, CMA who does her test results. Patient has an echo report with pending provider review. Explained she will be called about these results. She asked about her carotid doppler results, advised she call PCP who ordered this.   Routed to Hewlett Neckee, [place]

## 2017-07-15 NOTE — Telephone Encounter (Signed)
Mrs. Delford FieldWright is returning a call . Thanks

## 2017-07-16 NOTE — Telephone Encounter (Signed)
Called patient to let her know that her ECHO has not been resulted yet and I will contact her once I get the results. Patient voiced understanding.

## 2017-07-18 ENCOUNTER — Other Ambulatory Visit: Payer: Self-pay | Admitting: Family Medicine

## 2017-07-20 ENCOUNTER — Other Ambulatory Visit: Payer: Self-pay | Admitting: Cardiology

## 2017-07-20 ENCOUNTER — Other Ambulatory Visit: Payer: Self-pay | Admitting: Family Medicine

## 2017-07-20 NOTE — Telephone Encounter (Signed)
REFILL 

## 2017-07-24 ENCOUNTER — Other Ambulatory Visit: Payer: Self-pay | Admitting: *Deleted

## 2017-07-24 MED ORDER — LEVOTHYROXINE SODIUM 100 MCG PO TABS: 100.0000 ug | ORAL_TABLET | Freq: Every day | ORAL | 1 refills | Status: AC

## 2017-07-24 NOTE — Telephone Encounter (Signed)
Fax request received for 90 day supply.  Medication filled to pharmacy as requested with instructions to d/c previous script.

## 2017-07-27 ENCOUNTER — Encounter: Payer: Self-pay | Admitting: Physician Assistant

## 2017-07-27 ENCOUNTER — Ambulatory Visit (INDEPENDENT_AMBULATORY_CARE_PROVIDER_SITE_OTHER): Payer: Medicare Other | Admitting: Physician Assistant

## 2017-07-27 VITALS — BP 116/76 | HR 82 | Ht 60.0 in | Wt 160.0 lb

## 2017-07-27 DIAGNOSIS — I5032 Chronic diastolic (congestive) heart failure: Secondary | ICD-10-CM

## 2017-07-27 DIAGNOSIS — I471 Supraventricular tachycardia: Secondary | ICD-10-CM | POA: Diagnosis not present

## 2017-07-27 DIAGNOSIS — I6523 Occlusion and stenosis of bilateral carotid arteries: Secondary | ICD-10-CM | POA: Diagnosis not present

## 2017-07-27 DIAGNOSIS — I639 Cerebral infarction, unspecified: Secondary | ICD-10-CM | POA: Diagnosis not present

## 2017-07-27 NOTE — Patient Instructions (Signed)
Medication Instructions:  Your physician recommends that you continue on your current medications as directed. Please refer to the Current Medication list given to you today.  Labwork: Your physician recommends that you return for lab work in: TODAY-BMET   Testing/Procedures: NONE  Follow-Up: KEEP UPCOMING APPOINTMENT [facility]  Any Other Special Instructions Will Be Listed Below (If Applicable). 1. CHECK WEIGHT DAILY 2. CALL OFFICE IF HEARTRATE IS LESS THAN 45 OR MORE THAN 120 OR MORE 3. ONLY EAT 500MG  OF SODIUM A MEAL 4. DRINK 1.5 LITERS/QUARTS OF LIQUIDS A DAY 5. CALL THE OFFICE IF YOU GAIN MORE THAN 3LBS PER DAY OR 5 LBS PER WEEK  If you need a refill on your cardiac medications before your next appointment, please call your pharmacy.

## 2017-07-27 NOTE — Progress Notes (Signed)
Cardiology Office Note   Date:  07/27/2017   ID:  Rachael [name], DOB 1930/12/04, MRN [id number]  PCP:  Nelwyn Salisbury, MD  Cardiologist:  Dr Antoine Poche, 05/01/2016  Theodore Demark, PA-C 07/13/2017  Chief Complaint  Patient presents with  . Chest Pain    pt states the last 4 years its just pain  . Shortness of Breath    states some    History of Present Illness: Rachael [name] is a 84 y.o. female with a history of TIA, mod CAD 2002 cath, cataracts, hypothyroid, GERD, OA, neg MV 2013, SVT/palpitations  07/13/2017 office visit, pt w/ volume overload, continue Lasix 20 mg qd, echo ordered, BMET at f/u appt, ?HHRN  Rachael [name] presents for cardiology follow up.  She is with her daughter today who lives locally.  She is doing well in general. However, yesterday she stayed in bed all day. Just felt weak, no palpitations. Felt she was breathing a little hard, no LE edema. Rested and felt better. No chest pain.  She did not check her heart rate or blood pressure during this episode.  She does not think she was having any palpitations during this time.  However, she has a history of SVT with a heart rate over 200.  She does not remember feeling like she did at that time during this episode.  In general, she feels that her breathing is good.  Her dyspnea on exertion is improved, she is not having orthopnea or PND.  She gets a little bit of lower extremity edema during the day, but does not wake with it.  She has not had chest pain.  She has a days of the week pill box that she is using for her medications.  She is taking the medication at intervals during the day so she does not take them all at once.   Past Medical History:  Diagnosis Date  . Anemia   . Anxiety   . Arthritis   . CAD (coronary artery disease)    sees Dr. Antoine Poche, normal Myoview stress test 02-26-12   . Cataract    bilateral, per Dr. Mckinley Jewel   . Depression   . Diverticulitis of colon   . GERD  (gastroesophageal reflux disease)   . Gout    has seen Dr. Alben Deeds  . H/O hiatal hernia   . Hyperglycemia   . Hypothyroidism   . Lightheadedness   . Osteoarthritis   . Right knee DJD   . Shortness of breath    with anxiety attacks  . Supraventricular tachycardia (HCC) 04/07/12   In Dr Sherene Sires office - wore heart monitor for 3 weeks   . TIA (transient ischemic attack)    11/15    Past Surgical History:  Procedure Laterality Date  . ABDOMINAL HYSTERECTOMY    . BREATH TEK H PYLORI N/A 11/28/2013   Procedure: BREATH TEK H PYLORI;  Surgeon: Valarie Merino, MD;  Location: [facility];  Service: General;  Laterality: N/A;  . CATARACT EXTRACTION, BILATERAL  2016   per Dr. Dagoberto Ligas   . COLON SURGERY    . COLONOSCOPY  10-10-05   per Dr. Juanda Chance, repeat in 10 yrs   . ESOPHAGOGASTRODUODENOSCOPY N/A 08/29/2013   Procedure: ESOPHAGOGASTRODUODENOSCOPY (EGD);  Surgeon: Kandis Cocking, MD;  Location: [facility];  Service: General;  Laterality: N/A;  . HERNIA REPAIR    . HIATAL HERNIA REPAIR  09-21-10   per Dr. Luretha Murphy, lap [employer]   .  PARTIAL COLECTOMY     for adhesions  . TOTAL KNEE ARTHROPLASTY  06/07/2012   Procedure: TOTAL KNEE ARTHROPLASTY;  Surgeon: Nilda Simmer, MD;  Location: [facility];  Service: Orthopedics;  Laterality: Right;    Current Outpatient Medications  Medication Sig Dispense Refill  . allopurinol (ZYLOPRIM) 100 MG tablet TAKE 1 TABLET BY MOUTH ONCE DAILY 90 tablet 1  . furosemide (LASIX) 20 MG tablet Take 1 tablet (20 mg total) by mouth daily. 30 tablet 11  . ibuprofen (ADVIL,MOTRIN) 800 MG tablet Take 1 tablet (800 mg total) by mouth every 8 (eight) hours as needed. 60 tablet 5  . levothyroxine (SYNTHROID, LEVOTHROID) 100 MCG tablet Take 1 tablet (100 mcg total) by mouth daily before breakfast. 90 tablet 1  . metoprolol succinate (TOPROL-XL) 50 MG 24 hr tablet Take 1 tablet (50 mg total) by mouth daily. 90 tablet 3  . nitroGLYCERIN (NITROSTAT) 0.3 MG SL  tablet Place 1 tablet (0.3 mg total) under the tongue every 5 (five) minutes as needed for chest pain. 25 tablet 5  . omeprazole (PRILOSEC) 20 MG capsule TAKE 1 CAPSULE BY MOUTH TWICE DAILY BEFORE  A  MEAL 180 capsule 1  . Potassium Chloride ER 20 MEQ TBCR Take 20 mEq by mouth daily. 30 tablet 11   No current facility-administered medications for this visit.     Allergies:   Demerol [meperidine]; Meperidine hcl; and Sulfonamide derivatives    Social History:  The patient  reports that  has never smoked. she has never used smokeless tobacco. She reports that she does not drink alcohol or use drugs.   Family History:  The patient's family history includes Cervical cancer in her daughter; Diabetes Mellitus II in her unknown relative; Heart disease in her unknown relative.    ROS:  Please see the history of present illness. All other systems are reviewed and negative.    PHYSICAL EXAM: VS:  BP 116/76   Pulse 82   Ht 5' (1.524 m)   Wt 160 lb (72.6 kg)   SpO2 95%   BMI 31.25 kg/m  , BMI Body mass index is 31.25 kg/m. GEN: Well nourished, well developed, female in no acute distress  HEENT: normal for age  Neck: Minimal JVD, no carotid bruit, no masses Cardiac: RRR; soft murmur, no rubs, or gallops Respiratory: Decreased breath sounds bases bilaterally, normal work of breathing GI: soft, nontender, nondistended, + BS MS: no deformity or atrophy; trace pedal edema; distal pulses are 2+ in all 4 extremities   Skin: warm and dry, no rash Neuro:  Strength and sensation are intact Psych: euthymic mood, full affect   EKG:  EKG is not ordered today.  ECHO: 07/14/2017 - Left ventricle: The cavity size was normal. Wall thickness was   normal. Systolic function was normal. The estimated ejection   fraction was in the range of 60% to 65%. Wall motion was normal;   there were no regional wall motion abnormalities. Doppler   parameters are consistent with abnormal left ventricular    relaxation (grade 1 diastolic dysfunction). - Aortic valve: There was mild regurgitation. - Mitral valve: There was trivial regurgitation. - Left atrium: The atrium was mildly dilated.  CAROTID DOPPLERS: 07/09/2017 Final Interpretation: Right Carotid: Velocities in the right ICA are consistent with a 1-39% stenosis. Left Carotid: Velocities in the left ICA are consistent with a 1-39% stenosis. Vertebrals: Both vertebral arteries were patent with antegrade flow. Subclavians: Normal flow hemodynamics were seen in bilateral subclavian arteries.  MYOVIEW: 03/29/2015  The left ventricular ejection fraction is normal (55-65%).  Nuclear stress EF: 59%.  There was no ST segment deviation noted during stress.  The study is normal.  This is a low risk study.  Normal myocardial perfusion. Normal myocardial perfusion and function: EF 59% with normal wall motion.     Recent Labs: 05/13/2017: ALT 10; TSH 3.45 07/11/2017: B Natriuretic Peptide 511.5; BUN 19; Creatinine, Ser 1.06; Hemoglobin 13.4; Platelets 257; Potassium 3.6; Sodium 143    Lipid Panel    Component Value Date/Time   CHOL 215 (H) 05/13/2017 1502   TRIG 133.0 05/13/2017 1502   HDL 48.80 05/13/2017 1502   CHOLHDL 4 05/13/2017 1502   VLDL 26.6 05/13/2017 1502   LDLCALC 140 (H) 05/13/2017 1502   LDLDIRECT 136.4 06/19/2011 1215     Wt Readings from Last 3 Encounters:  07/27/17 160 lb (72.6 kg)  07/13/17 162 lb 6.4 oz (73.7 kg)  05/13/17 164 lb (74.4 kg)     Other studies Reviewed: Additional studies/ records that were reviewed today include: office notes, hospital records and testing.  ASSESSMENT AND PLAN:  1.  Chronic diastolic CHF: Respiratory status is improved by patient report.  She is paying more attention to the sodium in her diet.  I reinforced the need to limit sodium to 500 mg per meal.  She understands.  Okay to take medications spread out as long as she does not miss any doses.  Daily weights are very  important, it is okay to take an extra Lasix tablet daily as needed for weight gain.  2.  Hypothyroidism: She is careful to take the Synthroid 30 minutes before any other medications as appropriate.  3.  SVT: She is not had any recent palpitations.  She is encouraged to check her blood pressure and heart rate if she feels bad again.  She is encouraged to follow her blood pressure and heart rate routinely.  Report a heart rate that is less than 45 or more than 120.  4.  Bilateral carotid artery stenosis: She inquired as to the results of the Doppler she had recently.  I discussed them with her and explained that she has minor disease, no need to check it for a year or even 2.  Per Dr. Clent RidgesFry  Current medicines are reviewed at length with the patient today.  The patient does not have concerns regarding medicines.  The following changes have been made:  no change  Labs/ tests ordered today include:  No orders of the defined types were placed in this encounter.    Disposition:   FU with Dr Antoine PocheHochrein  Signed, Theodore Demarkhonda Hamilton Marinello, PA-C  07/27/2017 11:18 AM    [facility] Phone: [phone]; Fax: [phone]  This note was written with the assistance of speech recognition software. Please excuse any transcriptional errors.

## 2017-07-28 LAB — BASIC METABOLIC PANEL
BUN/Creatinine Ratio: 24 (ref 12–28)
BUN: 33 mg/dL — ABNORMAL HIGH (ref 8–27)
CALCIUM: 9.7 mg/dL (ref 8.7–10.3)
CHLORIDE: 103 mmol/L (ref 96–106)
CO2: 19 mmol/L — ABNORMAL LOW (ref 20–29)
Creatinine, Ser: 1.4 mg/dL — ABNORMAL HIGH (ref 0.57–1.00)
GFR calc non Af Amer: 34 mL/min/{1.73_m2} — ABNORMAL LOW (ref >59)
GFR, EST AFRICAN AMERICAN: 40 mL/min/{1.73_m2} — AB (ref >59)
GLUCOSE: 90 mg/dL (ref 65–99)
POTASSIUM: 4.7 mmol/L (ref 3.5–5.2)
Sodium: 140 mmol/L (ref 134–144)

## 2017-07-29 ENCOUNTER — Telehealth: Payer: Self-pay | Admitting: Physician Assistant

## 2017-07-29 ENCOUNTER — Other Ambulatory Visit: Payer: Self-pay

## 2017-07-29 DIAGNOSIS — I5032 Chronic diastolic (congestive) heart failure: Secondary | ICD-10-CM

## 2017-07-29 NOTE — Telephone Encounter (Signed)
Patient called in to discuss her recent lab results. The patient stated that she was afraid that she was in kidney failure and had been googling symptoms. She has been educated on the medication lasix and to only take it as needed. She will come back in 2 weeks for a repeated BMET. She has been instructed to call back if she has any further questions or concerns.   Notes recorded by Azalee CourseMeng, Hao, PA on 07/28/2017 at 5:33 PM EST Kidney function deteriorate slightly concerning for dehydration, recommend instead of using lasix and potassium supplement daily, take only on an as needed basis. Recheck BMET in 2 weeks

## 2017-07-29 NOTE — Telephone Encounter (Signed)
Patient's daughter called in per the patient's request (and dpr) to verify what the patient's instructions were concerning the recent lab work. The daughter has verbalized her understanding and was appreciative for the clarification. She will call back if anything further is needed.  Notes recorded by Azalee CourseMeng, Hao, PA on 07/28/2017 at 5:33 PM EST Kidney function deteriorate slightly concerning for dehydration, recommend instead of using lasix and potassium supplement daily, take only on an as needed basis. Recheck BMET in 2 weeks

## 2017-07-29 NOTE — Telephone Encounter (Signed)
New message ° ° ° °Patient calling for lab results °

## 2017-07-29 NOTE — Telephone Encounter (Signed)
New Message   Patient daughter Eunice BlaseDebbie is calling in reference to her mother labs and her kidney function. Please call to discuss.

## 2017-08-10 DIAGNOSIS — I5032 Chronic diastolic (congestive) heart failure: Secondary | ICD-10-CM | POA: Diagnosis not present

## 2017-08-10 LAB — BASIC METABOLIC PANEL
BUN / CREAT RATIO: 16 (ref 12–28)
BUN: 15 mg/dL (ref 8–27)
CHLORIDE: 105 mmol/L (ref 96–106)
CO2: 20 mmol/L (ref 20–29)
Calcium: 9.2 mg/dL (ref 8.7–10.3)
Creatinine, Ser: 0.96 mg/dL (ref 0.57–1.00)
GFR, EST AFRICAN AMERICAN: 62 mL/min/{1.73_m2} (ref >59)
GFR, EST NON AFRICAN AMERICAN: 54 mL/min/{1.73_m2} — AB (ref >59)
Glucose: 102 mg/dL — ABNORMAL HIGH (ref 65–99)
Potassium: 4.2 mmol/L (ref 3.5–5.2)
Sodium: 139 mmol/L (ref 134–144)

## 2017-08-12 ENCOUNTER — Telehealth: Payer: Self-pay | Admitting: Cardiology

## 2017-08-12 NOTE — Telephone Encounter (Signed)
Daughter notified 

## 2017-08-12 NOTE — Telephone Encounter (Signed)
Follow Up: ° ° ° °Returning your call from this morning, concerning her lab results. °

## 2017-08-17 NOTE — Progress Notes (Signed)
HPI The patient presents for followup of palpitations, chest pain, TIA and CAD.   The patient was in 2015 with apparent TIA. She had mild carotid plaque. There were no dysrhythmias noted. She did have a stress perfusion study in 2013 was negative. She had a cardiac catheterization with some moderate nonobstructive disease back in 2002.  She returns for follow up.    She has lots of questions.  She is confused about how much fluid to drink.  She was concerned about her renal insufficiency.  She has had some elevated creatinine but this was when she was on diuretic.  She was in the emergency room in February and I reviewed these records.  She had a slightly elevated BNP and some edema.  Her weight was up slightly.  She was given some diuretic and told to reduce her salt.  She did reduce her salt.  The diuretic was stopped when she was seen in follow-up her creatinine was up to 1.4.  Now she is taking it as needed but since she is not weighing herself every day she is probably not able to adequately judge when she needs it.  She has less edema   Allergies  Allergen Reactions  . Meperidine Hcl Other (See Comments)    Unknown   . Sulfonamide Derivatives Other (See Comments)    Happened about 15 years ago  . Demerol [Meperidine] Anxiety    Current Outpatient Medications  Medication Sig Dispense Refill  . allopurinol (ZYLOPRIM) 100 MG tablet TAKE 1 TABLET BY MOUTH ONCE DAILY 90 tablet 1  . levothyroxine (SYNTHROID, LEVOTHROID) 100 MCG tablet Take 1 tablet (100 mcg total) by mouth daily before breakfast. 90 tablet 1  . metoprolol succinate (TOPROL-XL) 50 MG 24 hr tablet Take 1 tablet (50 mg total) by mouth daily. 90 tablet 3  . nitroGLYCERIN (NITROSTAT) 0.3 MG SL tablet Place 1 tablet (0.3 mg total) under the tongue every 5 (five) minutes as needed for chest pain. 25 tablet 5  . omeprazole (PRILOSEC) 20 MG capsule TAKE 1 CAPSULE BY MOUTH TWICE DAILY BEFORE  A  MEAL 180 capsule 1  . furosemide  (LASIX) 20 MG tablet Take 1 tablet (20 mg total) by mouth daily. (Patient not taking: Reported on 08/19/2017) 30 tablet 11  . ibuprofen (ADVIL,MOTRIN) 800 MG tablet Take 1 tablet (800 mg total) by mouth every 8 (eight) hours as needed. (Patient not taking: Reported on 08/19/2017) 60 tablet 5  . Potassium Chloride ER 20 MEQ TBCR Take 20 mEq by mouth daily. (Patient not taking: Reported on 08/19/2017) 30 tablet 11   No current facility-administered medications for this visit.     Past Medical History:  Diagnosis Date  . Anemia   . Anxiety   . Arthritis   . CAD (coronary artery disease)    sees Dr. Antoine Poche, normal Myoview stress test 02-26-12   . Cataract    bilateral, per Dr. Mckinley Jewel   . Depression   . Diverticulitis of colon   . GERD (gastroesophageal reflux disease)   . Gout    has seen Dr. Alben Deeds  . H/O hiatal hernia   . Hyperglycemia   . Hypothyroidism   . Lightheadedness   . Osteoarthritis   . Right knee DJD   . Shortness of breath    with anxiety attacks  . Supraventricular tachycardia (HCC) 04/07/12   In Dr Sherene Sires office - wore heart monitor for 3 weeks   . TIA (transient ischemic attack)  11/15    Past Surgical History:  Procedure Laterality Date  . ABDOMINAL HYSTERECTOMY    . BREATH TEK H PYLORI N/A 11/28/2013   Procedure: BREATH TEK H PYLORI;  Surgeon: Valarie MerinoMatthew B Martin, MD;  [facility];  Service: General;  Laterality: N/A;  . CATARACT EXTRACTION, BILATERAL  2016   per Dr. Dagoberto LigasStoneburner   . COLON SURGERY    . COLONOSCOPY  10-10-05   per Dr. Juanda ChanceBrodie, repeat in 10 yrs   . ESOPHAGOGASTRODUODENOSCOPY N/A 08/29/2013   Procedure: ESOPHAGOGASTRODUODENOSCOPY (EGD);  Surgeon: Kandis Cockingavid H Newman, MD;  [facility];  Service: General;  Laterality: N/A;  . HERNIA REPAIR    . HIATAL HERNIA REPAIR  09-21-10   per Dr. Luretha MurphyMatthew Martin, lap [employer]   . PARTIAL COLECTOMY     for adhesions  . TOTAL KNEE ARTHROPLASTY  06/07/2012   Procedure: TOTAL KNEE  ARTHROPLASTY;  Surgeon: Nilda Simmerobert A Wainer, MD;  Location: [facility];  Service: Orthopedics;  Laterality: Right;    ROS:    Tremor and hypersomnolence.. Otherwise as stated in the HPI and negative for all other systems.  PHYSICAL EXAM BP 108/64   Pulse 64   Ht 5\' 3"  (1.6 m)   Wt 164 lb (74.4 kg)   BMI 29.05 kg/m   GENERAL:  Well appearing NECK:  No jugular venous distention, waveform within normal limits, carotid upstroke brisk and symmetric, no bruits, no thyromegaly LUNGS:  Clear to auscultation bilaterally CHEST:  Unremarkable HEART:  PMI not displaced or sustained,S1 and S2 within normal limits, no S3, no S4, no clicks, no rubs, no murmurs ABD:  Flat, positive bowel sounds normal in frequency in pitch, no bruits, no rebound, no guarding, no midline pulsatile mass, no hepatomegaly, no splenomegaly EXT:  2 plus pulses throughout, no edema, no cyanosis no clubbing   EKG:  None  ASSESSMENT AND PLAN  SVT:      She has had no symptomatic recurrence of this.  No change in therapy.   CORONARY ARTERY DISEASE She has had no new symptoms since stress testing in 2016.   CAROTID ARTERY DISEASE  This was mild in the past and she needs to follow up of this.   ACUTE ON CHRONIC DIASTOLIC DYSFUNCTION She seems to be euvolemic.  We talked about daily weights as needed dosing of her diuretic.  She will continue with low salt.  DYSLIPIDEMIA:   She has not wanted to take a statin.  No change in therapy or follow up labs are needed.   CKD II Creat was mildly elevated and I reassured her that there was no indication for nephrology consult.   FOOT PAIN She has chronic pain in both feet.  She has mildly reduced pulses but I do not suspect that her pain is vascular.  [facility].

## 2017-08-19 ENCOUNTER — Ambulatory Visit: Payer: Medicare Other | Admitting: Cardiology

## 2017-08-19 ENCOUNTER — Encounter: Payer: Self-pay | Admitting: Cardiology

## 2017-08-19 VITALS — BP 108/64 | HR 64 | Ht 63.0 in | Wt 164.0 lb

## 2017-08-19 DIAGNOSIS — N182 Chronic kidney disease, stage 2 (mild): Secondary | ICD-10-CM | POA: Diagnosis not present

## 2017-08-19 DIAGNOSIS — E785 Hyperlipidemia, unspecified: Secondary | ICD-10-CM

## 2017-08-19 DIAGNOSIS — M79671 Pain in right foot: Secondary | ICD-10-CM

## 2017-08-19 DIAGNOSIS — I471 Supraventricular tachycardia: Secondary | ICD-10-CM | POA: Diagnosis not present

## 2017-08-19 DIAGNOSIS — I5033 Acute on chronic diastolic (congestive) heart failure: Secondary | ICD-10-CM | POA: Insufficient documentation

## 2017-08-19 DIAGNOSIS — M79672 Pain in left foot: Secondary | ICD-10-CM

## 2017-08-19 NOTE — Patient Instructions (Signed)

## 2017-08-28 ENCOUNTER — Encounter: Payer: Self-pay | Admitting: Family Medicine

## 2017-08-28 ENCOUNTER — Ambulatory Visit (INDEPENDENT_AMBULATORY_CARE_PROVIDER_SITE_OTHER): Payer: Medicare Other | Admitting: Family Medicine

## 2017-08-28 VITALS — BP 110/68 | HR 65 | Temp 98.1°F | Ht 63.0 in | Wt 163.6 lb

## 2017-08-28 DIAGNOSIS — G8929 Other chronic pain: Secondary | ICD-10-CM

## 2017-08-28 DIAGNOSIS — M545 Low back pain, unspecified: Secondary | ICD-10-CM

## 2017-08-28 DIAGNOSIS — I5033 Acute on chronic diastolic (congestive) heart failure: Secondary | ICD-10-CM

## 2017-08-28 DIAGNOSIS — N179 Acute kidney failure, unspecified: Secondary | ICD-10-CM

## 2017-08-28 DIAGNOSIS — E039 Hypothyroidism, unspecified: Secondary | ICD-10-CM | POA: Diagnosis not present

## 2017-08-28 NOTE — Progress Notes (Signed)
   Subjective:    Patient ID: Rachael [name], female    DOB: 01/19/1931, 84 y.o.   MRN: [id number]  HPI Here to follow up an urgent care visit on 07-11-17 and several visits to the Cardiology office. At the UC visit she had some leg edema (though her lungs were clear) and she was started on Lasix 20 mg daily. The edema quickly resolved. Shortly after that her creatinine bumped a little to 1.40, so the Lasix was stopped. The creatinine then returned to normal and was down to 0.96 on 07-27-17. Her BNP was elevated to 511, so she was sent for an ECHO this showed excellent systolic function with an EF of 60-65%, but she has some diastolic dysfunction. Since then she has felt at her baseline. She usually uses a cane, but it is getting increasingly difficult for her to get around. [place] to [employer] or [employer] she uses a scooter. She asks if she can get one of her own.     Review of Systems  Constitutional: Negative.   Respiratory: Positive for shortness of breath.   Cardiovascular: Negative for chest pain, palpitations and leg swelling.  Musculoskeletal: Positive for arthralgias and gait problem.       Objective:   Physical Exam  Constitutional: She is oriented to person, place, and time. She appears well-developed and well-nourished.  Neck: No thyromegaly present.  Cardiovascular: Normal rate, regular rhythm, normal heart sounds and intact distal pulses.  Pulmonary/Chest: Effort normal and breath sounds normal. No respiratory distress. She has no wheezes. She has no rales.  Musculoskeletal: She exhibits no edema.  Lymphadenopathy:    She has no cervical adenopathy.  Neurological: She is alert and oriented to person, place, and time.          Assessment & Plan:  She is stable from a cardiac perspective, with some diastolic CHF. Her HTN is stable. Her renal function is stable. She is off diuretics and there is no further edema. She will follow up in 3 months. Gershon CraneStephen Norelle Runnion, MD

## 2017-09-28 ENCOUNTER — Telehealth: Payer: Self-pay | Admitting: *Deleted

## 2017-09-28 NOTE — Telephone Encounter (Signed)
Copied from CRM [id number]. Topic: General - Other >> Sep 28, 2017  2:51 PM Oneal Grout wrote: Reason for CRM: Patient has a history of Total knee Replacement, will patient need to be premedicated for dental procedures?

## 2017-09-28 NOTE — Telephone Encounter (Signed)
Sent to PCP to advise 

## 2017-10-01 MED ORDER — AMOXICILLIN 500 MG PO TABS: ORAL_TABLET | ORAL | 0 refills | Status: AC

## 2017-10-01 NOTE — Telephone Encounter (Signed)
Yes that would be a good idea. Call in Amoxicillin 500 mg to take 4 tabs on the mornings of dental procedures, #20 with no rf

## 2017-10-01 NOTE — Telephone Encounter (Signed)
Called pt's dental office and informed them that yes the pt will need to take antibiotic before all dental procedures for the rest of her life. Rx has been sent into pt's pharmacy.  Tried to call pt to advise but pt's VM has not been set up yet

## 2017-10-08 ENCOUNTER — Telehealth: Payer: Self-pay | Admitting: Family Medicine

## 2017-10-08 NOTE — Telephone Encounter (Signed)
Attempted to call patient- voice mail not set up. Per nursing Drug Book: To prevent endocarditis in patients having dental, oral, or respiratory tract procedures- 2g PO 1 hour before procedure. ( Patient was given #20- she will have extra for future need)

## 2017-10-08 NOTE — Telephone Encounter (Signed)
Copied from CRM [id number]. Topic: Quick Communication - See Telephone Encounter >> Oct 08, 2017 10:10 AM Waymon Amato wrote: Pt is having dental work tomorrow and was give her amoxicillin and not sure how to take it after she takes 4 tomorrow before the procedure   Best number [phone]

## 2017-10-08 NOTE — Telephone Encounter (Signed)
Patient notified to take 2g before procedure.

## 2017-10-24 DIAGNOSIS — 419620001 Death: Secondary | SNOMED CT | POA: Diagnosis not present

## 2017-10-24 DEATH — deceased

## 2018-01-06 ENCOUNTER — Encounter: Payer: Self-pay | Admitting: Family Medicine

## 2018-05-14 ENCOUNTER — Ambulatory Visit: Payer: Medicare Other
